# Patient Record
Sex: Female | Born: 1990 | Race: White | Hispanic: No | Marital: Single | Smoking: Never smoker
Health system: Southern US, Community
[De-identification: ages and names within clinical notes are randomized; demographics above are authoritative.]

## PROBLEM LIST (undated history)

## (undated) DIAGNOSIS — L409 Psoriasis, unspecified: Secondary | ICD-10-CM

## (undated) DIAGNOSIS — K5900 Constipation, unspecified: Secondary | ICD-10-CM

## (undated) DIAGNOSIS — L709 Acne, unspecified: Secondary | ICD-10-CM

## (undated) DIAGNOSIS — K219 Gastro-esophageal reflux disease without esophagitis: Secondary | ICD-10-CM

## (undated) DIAGNOSIS — F329 Major depressive disorder, single episode, unspecified: Secondary | ICD-10-CM

## (undated) DIAGNOSIS — F419 Anxiety disorder, unspecified: Secondary | ICD-10-CM

## (undated) DIAGNOSIS — K121 Other forms of stomatitis: Secondary | ICD-10-CM

## (undated) DIAGNOSIS — Z973 Presence of spectacles and contact lenses: Secondary | ICD-10-CM

## (undated) HISTORY — DX: Other forms of stomatitis: K12.1

## (undated) HISTORY — DX: Gastro-esophageal reflux disease without esophagitis: K21.9

## (undated) HISTORY — DX: Major depressive disorder, single episode, unspecified: F32.9

## (undated) HISTORY — PX: OTHER SURGICAL HISTORY: SHX169

## (undated) HISTORY — DX: Anxiety disorder, unspecified: F41.9

## (undated) HISTORY — PX: ESOPHAGOGASTRODUODENOSCOPY: SHX1529

---
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
## 2010-04-22 NOTE — Assessment & Plan Note (Signed)
Summary: R ear pain x 1 wk rm 3   Vital Signs:  Patient Profile:   20 Years Old Female CC:      R ear pain - x 1 wk Height:     65 inches Weight:      130 pounds O2 Sat:      100 % O2 treatment:    Room Air Temp:     97.7 degrees F oral Pulse rate:   91 / minute Pulse rhythm:   regular Resp:     16 per minute BP sitting:   115 / 69  (right arm) Cuff size:   regular  Vitals Entered By: Areta Haber CMA (October 19, 2009 2:19 PM)                  Current Allergies: No known allergies History of Present Illness Chief Complaint: R ear pain - x 1 wk History of Present Illness: LEFT ear pain for a week.  She pierced it about 6 months ago, it never healed up well and often had pus.  She tried to keep it clean.  Then took the earring out permanently and still had pain and pus.  Put on Keflex which helped.  Now 1 week ago, pain and swelling resumed.  She popped it and pus came out.  Now with tenderness, HA, swelling.  Current Problems: CELLULITIS, FACE, LEFT (ICD-682.0) FAMILY HISTORY DIABETES 1ST DEGREE RELATIVE (ICD-V18.0) GERD (ICD-530.81) ANXIETY (ICD-300.00)   Current Meds PRISTIQ 50 MG XR24H-TAB (DESVENLAFAXINE SUCCINATE) 1 tab by mouth once daily DEXILANT 60 MG CPDR (DEXLANSOPRAZOLE) 1 tab by mouth once daily SEASONIQUE 0.15-0.03 &0.01 MG TABS (LEVONORGEST-ETH ESTRAD 91-DAY) 1 tab by mouth once daily KEFLEX 500 MG CAPS (CEPHALEXIN) 1 tab by mouth two times a day for 10 days ULTRACET 37.5-325 MG TABS (TRAMADOL-ACETAMINOPHEN) 1 tab by mouth Q8 hours as needed for pain  REVIEW OF SYSTEMS Constitutional Symptoms      Denies fever, chills, night sweats, weight loss, weight gain, and fatigue.  Eyes       Denies change in vision, eye pain, eye discharge, glasses, contact lenses, and eye surgery. Ear/Nose/Throat/Mouth       Complains of ear pain and ear discharge.      Denies hearing loss/aids, change in hearing, dizziness, frequent runny nose, frequent nose bleeds, sinus  problems, sore throat, hoarseness, and tooth pain or bleeding.      Comments: x 1 wk  Respiratory       Denies dry cough, productive cough, wheezing, shortness of breath, asthma, bronchitis, and emphysema/COPD.  Cardiovascular       Denies murmurs, chest pain, and tires easily with exhertion.    Gastrointestinal       Denies stomach pain, nausea/vomiting, diarrhea, constipation, blood in bowel movements, and indigestion. Genitourniary       Denies painful urination, kidney stones, and loss of urinary control. Neurological       Complains of headaches.      Denies paralysis, seizures, and fainting/blackouts. Musculoskeletal       Denies muscle pain, joint pain, joint stiffness, decreased range of motion, redness, swelling, muscle weakness, and gout.  Skin       Denies bruising, unusual mles/lumps or sores, and hair/skin or nail changes.  Psych       Denies mood changes, temper/anger issues, anxiety/stress, speech problems, depression, and sleep problems. Other Comments: Pt has not seen PCP for this.   Past History:  Past Medical History: Anxiety GERD  Family  History: Family History of Anxiety Family History Diabetes 1st degree relative Family Hsitory Headaches Family History of Cardiovascular disorder  Social History: Single Never Smoked Alcohol use-yes - once a week Drug use-no Regular exercise-no Smoking Status:  never Drug Use:  no Does Patient Exercise:  no Physical Exam General appearance: well developed, well nourished, no acute distress Ears: Left upper pinna with small swollen area <0.5cm c/w old abscess or old piecing, no discharge, mild erythema and mild tenderness Nasal: mucosa pink, nonedematous, no septal deviation, turbinates normal Oral/Pharynx: tongue normal, posterior pharynx without erythema or exudate Neck: neck supple,  trachea midline, no masses Heart: regular rate and  rhythm, no murmur Abdomen: soft, non-tender without obvious organomegaly Skin:  no obvious rashes or lesions MSE: oriented to time, place, and person Assessment New Problems: CELLULITIS, FACE, LEFT (ICD-682.0) FAMILY HISTORY DIABETES 1ST DEGREE RELATIVE (ICD-V18.0) GERD (ICD-530.81) ANXIETY (ICD-300.00)   Plan New Medications/Changes: ULTRACET 37.5-325 MG TABS (TRAMADOL-ACETAMINOPHEN) 1 tab by mouth Q8 hours as needed for pain  #15 x 0, 10/19/2009, Hoyt Koch MD KEFLEX 500 MG CAPS (CEPHALEXIN) 1 tab by mouth two times a day for 10 days  #20 x 0, 10/19/2009, Hoyt Koch MD  New Orders: New Patient Level II [phone]  The patient and/or caregiver has been counseled thoroughly with regard to medications prescribed including dosage, schedule, interactions, rationale for use, and possible side effects and they verbalize understanding.  Diagnoses and expected course of recovery discussed and will return if not improved as expected or if the condition worsens. Patient and/or caregiver verbalized understanding.  Prescriptions: ULTRACET 37.5-325 MG TABS (TRAMADOL-ACETAMINOPHEN) 1 tab by mouth Q8 hours as needed for pain  #15 x 0   Entered and Authorized by:   Hoyt Koch MD   Signed by:   Hoyt Koch MD on 10/19/2009   Method used:   Print then Give to Patient   RxID:   [id number] KEFLEX 500 MG CAPS (CEPHALEXIN) 1 tab by mouth two times a day for 10 days  #20 x 0   Entered and Authorized by:   Hoyt Koch MD   Signed by:   Hoyt Koch MD on 10/19/2009   Method used:   Print then Give to Patient   RxID:   [id number]   Patient Instructions: 1)  Keep area clean and dry 2)  Take the antibiotics as prescribed 3)  If this continues to recur, may need to see either plastic surgery or dermatology to have this removed. 4)  Follow-up with your primary care physician   Orders Added: 1)  New Patient Level II [74259]

## 2011-08-07 DIAGNOSIS — J309 Allergic rhinitis, unspecified: Secondary | ICD-10-CM | POA: Insufficient documentation

## 2013-05-02 ENCOUNTER — Encounter: Payer: Self-pay | Admitting: Family Medicine

## 2013-05-02 ENCOUNTER — Ambulatory Visit (INDEPENDENT_AMBULATORY_CARE_PROVIDER_SITE_OTHER): Payer: BC Managed Care – PPO | Admitting: Family Medicine

## 2013-05-02 VITALS — BP 124/72 | HR 75 | Ht 66.0 in | Wt 130.0 lb

## 2013-05-02 DIAGNOSIS — F329 Major depressive disorder, single episode, unspecified: Secondary | ICD-10-CM | POA: Insufficient documentation

## 2013-05-02 DIAGNOSIS — K219 Gastro-esophageal reflux disease without esophagitis: Secondary | ICD-10-CM

## 2013-05-02 DIAGNOSIS — F419 Anxiety disorder, unspecified: Secondary | ICD-10-CM

## 2013-05-02 DIAGNOSIS — F32A Depression, unspecified: Secondary | ICD-10-CM

## 2013-05-02 DIAGNOSIS — F341 Dysthymic disorder: Secondary | ICD-10-CM

## 2013-05-02 DIAGNOSIS — R41840 Attention and concentration deficit: Secondary | ICD-10-CM | POA: Insufficient documentation

## 2013-05-02 DIAGNOSIS — F411 Generalized anxiety disorder: Secondary | ICD-10-CM

## 2013-05-02 HISTORY — DX: Depression, unspecified: F32.A

## 2013-05-02 HISTORY — DX: Anxiety disorder, unspecified: F41.9

## 2013-05-02 MED ORDER — BUPROPION HCL ER (XL) 150 MG PO TB24: 150.0000 mg | ORAL_TABLET | ORAL | Status: DC

## 2013-05-02 MED ORDER — BUPROPION HCL ER (XL) 300 MG PO TB24: 300.0000 mg | ORAL_TABLET | Freq: Every day | ORAL | Status: DC

## 2013-05-02 MED ORDER — DEXLANSOPRAZOLE 60 MG PO CPDR: 60.0000 mg | DELAYED_RELEASE_CAPSULE | Freq: Every day | ORAL | Status: DC

## 2013-05-02 MED ORDER — PANTOPRAZOLE SODIUM 40 MG PO TBEC: 40.0000 mg | DELAYED_RELEASE_TABLET | Freq: Two times a day (BID) | ORAL | Status: DC

## 2013-05-02 NOTE — Progress Notes (Signed)
CC: Andrea Baker is a 23 y.o. female is here for Establish Care and needs new rx for wellbutrin   Subjective: HPI:  Pleasant 23 year old here to establish care  Patient describes a history of anxiety and depression that has been present at least since 2011. For the past one to 2 years she has been on Wellbutrin 300 mg on a daily basis without known side effects nor uncontrolled anxiety or depression. She is extremely happy with quality of life when she is on on this medication. Unfortunately she ran out of it one week ago and decided on waiting to get a followup of women to determine if she actually needed to keep taking it. For the past 5 days she has noticed increased depression emotion, irritability, and subjective anxiety described as worrying about nothing in particular when trying to sleep and keeping her awake. Nothing seems to make the symptoms better or worse. It is present at school and at home. She denies any major life changing events that she should be depressed or anxious about recently or remotely. There has been no thoughts of wanting to harm self or others  She reports a history of concentration difficulty described as only being able to study if she is in a particular study room at her like wearing. She also has episodes of mind wandering during lectures after 30 minutes of listening, this also occurs when standing by herself.  Nothing particularly makes symptoms better or worse there mild to moderate severity present most days of the week.  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 Is described as acid and burning sensation that radiates from the epigastric region into the back of her throat substernally. It is significantly worse with chocolate, lying down flat 3 hours within having a meal, and with alcohol. It has been responsive to omeprazole and Protonix in the past however both of these medications slowly lost  their effectiveness. Currently she is taking Protonix 40 mg twice a day states that symptoms are mild to moderately daily basis.  She has a history of vomiting because this is been so bad. She has had endoscopy and even had strictures years ago. Denies regurgitation or difficulty swallowing currently  Review of Systems - General ROS: negative for - chills, fever, night sweats, weight gain or weight loss Ophthalmic ROS: negative for - decreased vision Psychological ROS: negative for - mental disturbance other than that described above ENT ROS: negative for - hearing change, nasal congestion, tinnitus or allergies Hematological and Lymphatic ROS: negative for - bleeding problems, bruising or swollen lymph nodes Breast ROS: negative Respiratory ROS: no cough, shortness of breath, or wheezing Cardiovascular ROS: no chest pain or dyspnea on exertion Gastrointestinal ROS: no  change in bowel habits, or black or bloody stools Genito-Urinary ROS: negative for - genital discharge, genital ulcers, incontinence or abnormal bleeding from genitals Musculoskeletal ROS: negative for - joint pain or muscle pain Neurological ROS: negative for - headaches or memory loss Dermatological ROS: negative for lumps, mole changes, rash and skin lesion changes  Past Medical History  Diagnosis Date  . GERD 10/19/2009    Failed omeprazole and protonix.   Andrea Baker. Anxiety and depression 05/02/2013    Intolerant to lexapro and effexor     History reviewed. No pertinent past surgical history. Family History  Problem Relation Age of Onset  . Heart attack    . Depression    . Diabetes    .  Hyperlipidemia    . Hypertension      History   Social History  . Marital Status: Single    Spouse Name: N/A    Number of Children: N/A  . Years of Education: N/A   Occupational History  . Not on file.   Social History Main Topics  . Smoking status: Never Smoker   . Smokeless tobacco: Not on file  . Alcohol Use: No  . Drug  Use: No  . Sexual Activity: Not Currently   Other Topics Concern  . Not on file   Social History Narrative  . No narrative on file     Objective: BP 124/72  Pulse 75  Ht 5\' 6"  (1.676 m)  Wt 130 lb (58.968 kg)  BMI 20.99 kg/m2  Vital signs reviewed. General: Alert and Oriented, No Acute Distress HEENT: Pupils equal, round, reactive to light. Conjunctivae clear.  External ears unremarkable.  Moist mucous membranes. Lungs: Clear and comfortable work of breathing, speaking in full sentences without accessory muscle use. Cardiac: Regular rate and rhythm.  Neuro: CN II-XII grossly intact, gait normal. Extremities: No peripheral edema.  Strong peripheral pulses.  Mental Status: No depression, anxiety, nor agitation. Logical though process. Skin: Warm and dry.  Assessment & Plan: [name] was seen today for establish care and needs new rx for wellbutrin.  Diagnoses and associated orders for this visit:  ANXIETY  Anxiety and depression - buPROPion (WELLBUTRIN XL) 150 MG 24 hr tablet; Take 1 tablet (150 mg total) by mouth every morning. - buPROPion (WELLBUTRIN XL) 300 MG 24 hr tablet; Take 1 tablet (300 mg total) by mouth daily.  GERD - dexlansoprazole (DEXILANT) 60 MG capsule; Take 1 capsule (60 mg total) by mouth daily.  Concentration deficit  Other Orders - pantoprazole (PROTONIX) 40 MG tablet; Take 1 tablet (40 mg total) by mouth 2 (two) times daily.    Anxiety and depression: Uncontrolled chronic condition restart Wellbutrin Concentration difficulty: Restart 300 mg of Wellbutrin and after 1-2 weeks of resolved anxiety and depression increased to 450 mg daily, if this does not help in the following weeks we will referred for formal ADD evaluation/testing GERD: Uncontrolled chronic condition, hold on Protonix instead try DEXILANT 60 mg daily for the next 20 days if this provides any better relief I be happy to send send in a formal prescription   Return in about 3 months  (around 07/30/2013).

## 2013-05-17 ENCOUNTER — Encounter: Payer: Self-pay | Admitting: Family Medicine

## 2013-05-17 DIAGNOSIS — L309 Dermatitis, unspecified: Secondary | ICD-10-CM | POA: Insufficient documentation

## 2013-06-06 ENCOUNTER — Telehealth: Payer: Self-pay | Admitting: Family Medicine

## 2013-06-06 ENCOUNTER — Encounter: Payer: Self-pay | Admitting: *Deleted

## 2013-06-06 DIAGNOSIS — K219 Gastro-esophageal reflux disease without esophagitis: Secondary | ICD-10-CM

## 2013-06-06 NOTE — Telephone Encounter (Signed)
Patient has been satisfied with the dexilant 60 mg sent to [facility].  thanks

## 2013-06-07 MED ORDER — DEXLANSOPRAZOLE 60 MG PO CPDR: 60.0000 mg | DELAYED_RELEASE_CAPSULE | Freq: Every day | ORAL | Status: DC

## 2013-06-07 NOTE — Telephone Encounter (Signed)
Cell number vm not set up yet; and home # no longer in service

## 2013-06-07 NOTE — Telephone Encounter (Signed)
Sue Lushndrea, Will you please let patient know rx was sent.

## 2013-07-19 ENCOUNTER — Other Ambulatory Visit: Payer: Self-pay | Admitting: *Deleted

## 2013-07-19 MED ORDER — LEVONORGEST-ETH ESTRAD 91-DAY 0.15-0.03 &0.01 MG PO TABS: 1.0000 | ORAL_TABLET | Freq: Every day | ORAL | Status: DC

## 2013-09-26 ENCOUNTER — Telehealth: Payer: Self-pay | Admitting: Family Medicine

## 2013-09-26 DIAGNOSIS — D7589 Other specified diseases of blood and blood-forming organs: Secondary | ICD-10-CM

## 2013-09-26 DIAGNOSIS — Z13220 Encounter for screening for lipoid disorders: Secondary | ICD-10-CM

## 2013-09-26 DIAGNOSIS — Z130 Encounter for screening for diseases of the blood and blood-forming organs and certain disorders involving the immune mechanism: Secondary | ICD-10-CM

## 2013-09-26 DIAGNOSIS — Z131 Encounter for screening for diabetes mellitus: Secondary | ICD-10-CM

## 2013-09-26 NOTE — Telephone Encounter (Signed)
Order placed up front and pt notified

## 2013-09-26 NOTE — Telephone Encounter (Signed)
Mom called. Daughter has a cpe scheduled for 7/21 and she would like Lab Order put in next week to include check for diabetes.  Thank you.

## 2013-09-26 NOTE — Telephone Encounter (Signed)
Sue Lushndrea, Orders placed in your inbox.

## 2013-09-30 LAB — BASIC METABOLIC PANEL WITH GFR
BUN: 9 mg/dL (ref 6–23)
CALCIUM: 9.5 mg/dL (ref 8.4–10.5)
CHLORIDE: 102 meq/L (ref 96–112)
CO2: 25 meq/L (ref 19–32)
CREATININE: 0.71 mg/dL (ref 0.50–1.10)
GFR, Est African American: >89 mL/min
GFR, Est Non African American: >89 mL/min
Glucose, Bld: 95 mg/dL (ref 70–99)
Potassium: 4.7 mEq/L (ref 3.5–5.3)
Sodium: 139 mEq/L (ref 135–145)

## 2013-09-30 LAB — LIPID PANEL
CHOLESTEROL: 153 mg/dL (ref 0–200)
HDL: 47 mg/dL (ref >39)
LDL Cholesterol: 95 mg/dL (ref 0–99)
TRIGLYCERIDES: 53 mg/dL (ref <150)
Total CHOL/HDL Ratio: 3.3 Ratio
VLDL: 11 mg/dL (ref 0–40)

## 2013-09-30 LAB — CBC
HEMATOCRIT: 41.8 % (ref 36.0–46.0)
Hemoglobin: 14.1 g/dL (ref 12.0–15.0)
MCH: 31.6 pg (ref 26.0–34.0)
MCHC: 33.7 g/dL (ref 30.0–36.0)
MCV: 93.7 fL (ref 78.0–100.0)
Platelets: 338 10*3/uL (ref 150–400)
RBC: 4.46 MIL/uL (ref 3.87–5.11)
RDW: 12.4 % (ref 11.5–15.5)
WBC: 4.8 10*3/uL (ref 4.0–10.5)

## 2013-10-05 ENCOUNTER — Encounter: Payer: BC Managed Care – PPO | Admitting: Physician Assistant

## 2013-10-10 ENCOUNTER — Encounter: Payer: BC Managed Care – PPO | Admitting: Family Medicine

## 2013-10-30 ENCOUNTER — Other Ambulatory Visit (HOSPITAL_COMMUNITY)
Admission: RE | Admit: 2013-10-30 | Discharge: 2013-10-30 | Disposition: A | Discharge status: Home / Self Care | Payer: BC Managed Care – PPO | Source: Ambulatory Visit | Attending: Physician Assistant | Admitting: Physician Assistant

## 2013-10-30 ENCOUNTER — Ambulatory Visit (INDEPENDENT_AMBULATORY_CARE_PROVIDER_SITE_OTHER): Payer: BC Managed Care – PPO | Admitting: Physician Assistant

## 2013-10-30 ENCOUNTER — Encounter: Payer: Self-pay | Admitting: Physician Assistant

## 2013-10-30 VITALS — BP 122/84 | HR 84 | Ht 65.0 in | Wt 127.0 lb

## 2013-10-30 DIAGNOSIS — N760 Acute vaginitis: Secondary | ICD-10-CM | POA: Diagnosis present

## 2013-10-30 DIAGNOSIS — Z01419 Encounter for gynecological examination (general) (routine) without abnormal findings: Secondary | ICD-10-CM | POA: Insufficient documentation

## 2013-10-30 DIAGNOSIS — L708 Other acne: Secondary | ICD-10-CM

## 2013-10-30 DIAGNOSIS — Z23 Encounter for immunization: Secondary | ICD-10-CM

## 2013-10-30 DIAGNOSIS — L700 Acne vulgaris: Secondary | ICD-10-CM

## 2013-10-30 DIAGNOSIS — Z113 Encounter for screening for infections with a predominantly sexual mode of transmission: Secondary | ICD-10-CM | POA: Insufficient documentation

## 2013-10-30 DIAGNOSIS — Z0000 Encounter for general adult medical examination without abnormal findings: Secondary | ICD-10-CM

## 2013-10-30 DIAGNOSIS — F419 Anxiety disorder, unspecified: Secondary | ICD-10-CM

## 2013-10-30 DIAGNOSIS — F329 Major depressive disorder, single episode, unspecified: Secondary | ICD-10-CM

## 2013-10-30 DIAGNOSIS — Z3041 Encounter for surveillance of contraceptive pills: Secondary | ICD-10-CM

## 2013-10-30 DIAGNOSIS — R209 Unspecified disturbances of skin sensation: Secondary | ICD-10-CM

## 2013-10-30 DIAGNOSIS — R200 Anesthesia of skin: Secondary | ICD-10-CM

## 2013-10-30 DIAGNOSIS — F341 Dysthymic disorder: Secondary | ICD-10-CM

## 2013-10-30 MED ORDER — BUPROPION HCL ER (XL) 300 MG PO TB24: 300.0000 mg | ORAL_TABLET | Freq: Every day | ORAL | Status: DC

## 2013-10-30 MED ORDER — NORGESTIM-ETH ESTRAD TRIPHASIC 0.18/0.215/0.25 MG-35 MCG PO TABS: 1.0000 | ORAL_TABLET | Freq: Every day | ORAL | Status: DC

## 2013-10-30 NOTE — Patient Instructions (Signed)
Will get EMG.   Keeping You Healthy  Get These Tests 1. Blood Pressure- Have your blood pressure checked once a year by your health care provider.  Normal blood pressure is 120/80. 2. Weight- Have your body mass index (BMI) calculated to screen for obesity.  BMI is measure of body fat based on height and weight.  You can also calculate your own BMI at https://www.west-esparza.com/www.nhlbisupport.com/bmi/. 3. Cholesterol- Have your cholesterol checked every 5 years starting at age 23 then yearly starting at age 23. 4. Chlamydia, HIV, and other sexually transmitted diseases- Get screened every year until age 23, then within three months of each new sexual provider. 5. Pap Smear- Every 1-3 years; discuss with your health care provider. 6. Mammogram- Every year starting at age 23  Take these medicines  Calcium with Vitamin D-Your body needs 1200 mg of Calcium each day and [phone] IU of Vitamin D daily.  Your body can only absorb 500 mg of Calcium at a time so Calcium must be taken in 2 or 3 divided doses throughout the day.  Multivitamin with folic acid- Once daily if it is possible for you to become pregnant.  Get these Immunizations  Tetanus shot- Every 10 years.  Flu shot-Every year.  Take these steps 1. Do not smoke-Your healthcare provider can help you quit.  For tips on how to quit go to www.smokefree.gov or call 1-800 QUITNOW. 2. Be physically active- Exercise 5 days a week for at least 30 minutes.  If you are not already physically active, start slow and gradually work up to 30 minutes of moderate physical activity.  Examples of moderate activity include walking briskly, dancing, swimming, bicycling, etc. 3. Breast Cancer- A self breast exam every month is important for early detection of breast cancer.  For more information and instruction on self breast exams, ask your healthcare provider or [place].eswww.womenshealth.gov/faq/breast-self-exam.cfm. 4. Eat a healthy diet- Eat a variety of healthy foods such as fruits,  vegetables, whole grains, low fat milk, low fat cheeses, yogurt, lean meats, poultry and fish, beans, nuts, tofu, etc.  For more information go to www. Thenutritionsource.org 5. Drink alcohol in moderation- Limit alcohol intake to one drink or less per day. Never drink and drive. 6. Depression- Your emotional health is as important as your physical health.  If you're feeling down or losing interest in things you normally enjoy please talk to your healthcare provider about being screened for depression. 7. Dental visit- Brush and floss your teeth twice daily; visit your dentist twice a year. 8. Eye doctor- Get an eye exam at least every 2 years. 9. Helmet use- Always wear a helmet when riding a bicycle, motorcycle, rollerblading or skateboarding. 10. Safe sex- If you may be exposed to sexually transmitted infections, use a condom. 11. Seat belts- Seat belts can save your live; always wear one. 12. Smoke/Carbon Monoxide detectors- These detectors need to be installed on the appropriate level of your home. Replace batteries at least once a year. 13. Skin cancer- When out in the sun please cover up and use sunscreen 15 SPF or higher. 14. Violence- If anyone is threatening or hurting you, please tell your healthcare provider.

## 2013-10-30 NOTE — Progress Notes (Addendum)
Subjective:     Andrea Baker is a 23 y.o. female and is here for a comprehensive physical exam. The patient reports problems - pt does want to change her birth control. she has read her particular brand can make acne worse. she has notice her acne has been worsening. she would like to try what her sister is on ortho tri cyclen. no other problems with birth control. pt also complains of bilateral hand numbness. mostly in her 4th and 5th metatarsal. no trauma to wrist or elbow. occurs in morning and throughout the day. something she feels like her grip is not like it should be. not aware of any other triggers. .  She has had abnormal paps. She was followed up with coloposcopy and 2 repeat paps which were normal. This was in the last 2 years.   History   Social History  . Marital Status: Single    Spouse Name: N/A    Number of Children: N/A  . Years of Education: N/A   Occupational History  . Not on file.   Social History Main Topics  . Smoking status: Never Smoker   . Smokeless tobacco: Not on file  . Alcohol Use: No  . Drug Use: No  . Sexual Activity: Not Currently   Other Topics Concern  . Not on file   Social History Narrative  . No narrative on file   Health Maintenance  Topic Date Due  . Influenza Vaccine  10/21/2013  . Pap Smear  08/22/2015  . Tetanus/tdap  10/31/2023    The following portions of the patient's history were reviewed and updated as appropriate: allergies, current medications, past family history, past medical history, past social history, past surgical history and problem list.  Review of Systems Pertinent items are noted in HPI.   Objective:    BP 122/84  Pulse 84  Ht 5\' 5"  (1.651 m)  Wt 127 lb (57.607 kg)  BMI 21.13 kg/m2 General appearance: alert, cooperative and appears stated age Head: Normocephalic, without obvious abnormality, atraumatic Eyes: conjunctivae/corneas clear. PERRL, EOM's intact. Fundi benign. Ears: normal TM's and external  ear canals both ears Nose: Nares normal. Septum midline. Mucosa normal. No drainage or sinus tenderness. Throat: lips, mucosa, and tongue normal; teeth and gums normal Neck: no adenopathy, no carotid bruit, no JVD, supple, symmetrical, trachea midline and thyroid not enlarged, symmetric, no tenderness/mass/nodules Back: symmetric, no curvature. ROM normal. No CVA tenderness. Lungs: clear to auscultation bilaterally Breasts: normal appearance, no masses or tenderness Heart: regular rate and rhythm, S1, S2 normal, no murmur, click, rub or gallop Abdomen: soft, non-tender; bowel sounds normal; no masses,  no organomegaly Pelvic: cervix normal in appearance, external genitalia normal, no adnexal masses or tenderness, no cervical motion tenderness, uterus normal size, shape, and consistency and vagina normal without discharge Extremities: extremities normal, atraumatic, no cyanosis or edema Pulses: 2+ and symmetric Skin: Skin color, texture, turgor normal. No rashes or lesions scattered whiteheads and blackheads around lips and jawline and cheeks.  Lymph nodes: Cervical, supraclavicular, and axillary nodes normal. Neurologic: Reflexes: no reflexes of knee present. cranial nerves intact and no abnormalities.  negative tinels. Phalens did produce bilateral hand numbness mostly in 4th and 5th metatarsal.    Assessment:    Healthy female exam.      Plan:    CPE- Tdap given without complications. Pap done today. Encouraged regular exercise at least 150 minutes a week. Calcium 1200mg  and Vitamin D 800units. HO given.   Bilateral hand numbness-  symptoms sound like carpel tunnel or some type of medial/ulnar nerve compression. She is very young for this. Will get EMG to evaluate. Discussed with pt to follow up with PCP. Dr. Ivan AnchorsHommel for further investigation. She already had her labs done before today. Will let PCP order labs accordingly such as TSH, b12, vitamin D, Cbc.  i did suggest wrist brace to wear at  night.    Contraception management/Acne- Switched to ortho tri cyclen for better acne management. Follow up with PCP with any other OCP changes.  Anxiety and depression- wellbutrin refilled for 6 months. Pt is very controlled.     See After Visit Summary for Counseling Recommendations

## 2013-11-02 LAB — CYTOLOGY - PAP

## 2013-12-08 ENCOUNTER — Ambulatory Visit: Payer: BC Managed Care – PPO | Admitting: Neurology

## 2013-12-28 ENCOUNTER — Ambulatory Visit: Payer: BC Managed Care – PPO | Admitting: Neurology

## 2013-12-29 ENCOUNTER — Ambulatory Visit: Payer: BC Managed Care – PPO | Admitting: Neurology

## 2013-12-29 ENCOUNTER — Encounter: Payer: Self-pay | Admitting: Neurology

## 2013-12-29 ENCOUNTER — Ambulatory Visit (INDEPENDENT_AMBULATORY_CARE_PROVIDER_SITE_OTHER): Payer: BC Managed Care – PPO | Admitting: Neurology

## 2013-12-29 VITALS — BP 90/58 | HR 78 | Ht 66.0 in | Wt 129.2 lb

## 2013-12-29 DIAGNOSIS — G5622 Lesion of ulnar nerve, left upper limb: Secondary | ICD-10-CM

## 2013-12-29 DIAGNOSIS — M79639 Pain in unspecified forearm: Secondary | ICD-10-CM

## 2013-12-29 NOTE — Progress Notes (Signed)
[facility] Clinic Note - Initial Visit   Date: 12/29/2013  Andrea Baker MRN: [id number] DOB: [birth year]   Dear Dr. Ivan Anchors:  Thank you for your kind referral of Andrea Baker for consultation of bilateral hand numbness. Although her history is well known to you, please allow [place] to reiterate it for the purpose of our medical record. The patient was accompanied to the clinic by self.    History of Present Illness: Andrea Baker is a 23 y.o. right-handed Caucasian female with history of GERD and depression presenting for evaluation of bilateral hand numbness.  Starting early 2014, she developed intermittent numbness involving the 4th and 5th digits.  She notices it more if she has had caffeine. No alleviating factors.  She has associated subjective loss of grip in both hands, making it difficult to take notes in school. She has not been dropping things. She describes it has "first getting up in the morning and not being able to squeeze hands". No associated tingling or neck pain.    She has always been very tremulous of her hands.  No family history of tremors, except her second cousin.  Tremor is worse with caffeine, stress/anxiety.  She has not noticed any change with alcohol.     Since August, she developed left great toe numbness, but this has only occurred once.    Out-side paper records, electronic medical record, and images have been reviewed where available and summarized as:  Labs 09/29/2013:  Na 139, K 4.7, Chl 102, Cr 0.7 Lab Results  Component Value Date   CHOL 153 09/29/2013   HDL 47 09/29/2013   LDLCALC 95 09/29/2013   TRIG 53 09/29/2013   CHOLHDL 3.3 09/29/2013     Past Medical History  Diagnosis Date  . GERD 10/19/2009    Failed omeprazole and protonix.   Marland Kitchen Anxiety and depression 05/02/2013    Intolerant to lexapro and effexor     No past surgical history on file.   Medications:  Current Outpatient Prescriptions on File Prior to Visit    Medication Sig Dispense Refill  . buPROPion (WELLBUTRIN XL) 300 MG 24 hr tablet Take 1 tablet (300 mg total) by mouth daily.  30 tablet  5  . esomeprazole (NEXIUM) 40 MG capsule Take 40 mg by mouth 2 (two) times daily.      . Probiotic Product (PROBIOTIC DAILY PO) Take by mouth.       No current facility-administered medications on file prior to visit.    Allergies:  Allergies  Allergen Reactions  . Augmentin [Amoxicillin-Pot Clavulanate]     vomiting    Family History: Family History  Problem Relation Age of Onset  . Heart attack    . Depression    . Diabetes    . Hyperlipidemia    . Hypertension      Social History: History   Social History  . Marital Status: Single    Spouse Name: N/A    Number of Children: N/A  . Years of Education: N/A   Occupational History  . Not on file.   Social History Main Topics  . Smoking status: Never Smoker   . Smokeless tobacco: Not on file  . Alcohol Use: No  . Drug Use: No  . Sexual Activity: Not Currently   Other Topics Concern  . Not on file   Social History Narrative  . No narrative on file    Review of Systems:  CONSTITUTIONAL: No fevers, chills, night sweats, or weight  loss.  EYES: No visual changes or eye pain ENT: No hearing changes.  No history of nose bleeds.   RESPIRATORY: No cough, wheezing and shortness of breath.   CARDIOVASCULAR: Negative for chest pain, and palpitations.   GI: Negative for abdominal discomfort, blood in stools or black stools.  No recent change in bowel habits.   GU:  No history of incontinence.   MUSCLOSKELETAL: +history of joint pain or swelling.  No myalgias.   SKIN: Negative for lesions, rash, and itching.   HEMATOLOGY/ONCOLOGY: Negative for prolonged bleeding, bruising easily, and swollen nodes.  No history of cancer.   ENDOCRINE: Negative for cold or heat intolerance, polydipsia or goiter.   PSYCH:  +depression or anxiety symptoms.   NEURO: As Above.   Vital Signs:  BP 90/58   Pulse 78  Ht 5\' 6"  (1.676 m)  Wt 129 lb 3 oz (58.599 kg)  BMI 20.86 kg/m2  SpO2 99%   General Medical Exam:   General:  Well appearing, comfortable.   Eyes/ENT: see cranial nerve examination.   Neck: No masses appreciated.  Full range of motion without tenderness.  No carotid bruits. Respiratory:  Clear to auscultation, good air entry bilaterally.   Cardiac:  Regular rate and rhythm, no murmur.   Extremities:  No deformities, edema, or skin discoloration. Good capillary refill.   Skin:  Skin color, texture, turgor normal. No rashes or lesions.  Neurological Exam: MENTAL STATUS including orientation to time, place, person, recent and remote memory, attention span and concentration, language, and fund of knowledge is normal.  Speech is not dysarthric.  CRANIAL NERVES: II:  No visual field defects.  Unremarkable fundi.   III-IV-VI: Pupils equal round and reactive to light.  Normal conjugate, extra-ocular eye movements in all directions of gaze.  No nystagmus.  No ptosis.   V:  Normal facial sensation.   VII:  Normal facial symmetry and movements.   VIII:  Normal hearing and vestibular function.   IX-X:  Normal palatal movement.   XI:  Normal shoulder shrug and head rotation.   XII:  Normal tongue strength and range of motion, no deviation or fasciculation.  MOTOR:  No atrophy.  Bilateral low amplitude high frequency tremor of the hands, worse with hands out-stretched (L >R).  No pronator drift.  Tone is normal.  Tinel's negative over bilateral wrists and elbows.  Right Upper Extremity:    Left Upper Extremity:    Deltoid  5/5   Deltoid  5/5   Biceps  5/5   Biceps  5/5   Triceps  5/5   Triceps  5/5   Wrist extensors  5/5   Wrist extensors  5/5   Wrist flexors  5/5   Wrist flexors  5/5   Finger extensors  5/5   Finger extensors  5/5   Finger flexors  5/5   Finger flexors  5/5   Dorsal interossei  5/5   Dorsal interossei  5/5   Abductor pollicis  5/5   Abductor pollicis  5/5    Tone (Ashworth scale)  0  Tone (Ashworth scale)  0   Right Lower Extremity:    Left Lower Extremity:    Hip flexors  5/5   Hip flexors  5/5   Hip extensors  5/5   Hip extensors  5/5   Knee flexors  5/5   Knee flexors  5/5   Knee extensors  5/5   Knee extensors  5/5   Dorsiflexors  5/5  Dorsiflexors  5/5   Plantarflexors  5/5   Plantarflexors  5/5   Toe extensors  5/5   Toe extensors  5/5   Toe flexors  5/5   Toe flexors  5/5   Tone (Ashworth scale)  0  Tone (Ashworth scale)  0   MSRs:  Right                                                                 Left brachioradialis 2+  brachioradialis 2+  biceps 2+  biceps 2+  triceps 2+  triceps 2+  patellar 2+  patellar 2+  ankle jerk 2+  ankle jerk 2+  Hoffman no  Hoffman no  plantar response down  plantar response down   SENSORY:  Normal and symmetric perception of light touch, pinprick, vibration, and proprioception.  Romberg's sign absent.   COORDINATION/GAIT: Normal finger-to- nose-finger and heel-to-shin.  Intact rapid alternating movements bilaterally.  Able to rise from a chair without using arms.  Gait narrow based and stable. Tandem and stressed gait intact.    IMPRESSION: 1.  Left hand paresthesias  - EMG to determine whether symptoms are related to ulnar neuropathy, less likely C8 radiculopathy  - Discussed using elbow pad and avoiding hyperflexion of the elbow to see if this helps symptoms 2.  Bilateral forearm discomfort, especially with overuse of wrists  - Will obtain electrodiagnostic testing, but the nature of these symptoms do not seem neurological, ?tendonitis 3.  Benign essential tremors, worse on left  - Follow clinically 4.  Return to clinic in 4-6 weeks   The duration of this appointment visit was 45 minutes of face-to-face time with the patient.  Greater than 50% of this time was spent in counseling, explanation of diagnosis, planning of further management, and coordination of care.   Thank you for  allowing me to participate in patient's care.  If I can answer any additional questions, I would be pleased to do so.    Sincerely,    Donika K. Allena KatzPatel, DO

## 2013-12-29 NOTE — Patient Instructions (Signed)
1.  Start using elbow bad and avoid hyperflexion of the left elbow 2.  EMG of the left > right arms 3.  Return to clinic after EMG

## 2014-01-10 ENCOUNTER — Ambulatory Visit (INDEPENDENT_AMBULATORY_CARE_PROVIDER_SITE_OTHER): Payer: BC Managed Care – PPO | Admitting: Neurology

## 2014-01-10 DIAGNOSIS — G5622 Lesion of ulnar nerve, left upper limb: Secondary | ICD-10-CM

## 2014-01-10 DIAGNOSIS — M79639 Pain in unspecified forearm: Secondary | ICD-10-CM

## 2014-01-10 NOTE — Procedures (Signed)
[facility]  [address], [address]  [place], [place] [address] Tel: [phone] Fax:  [phone] Test Date:  01/10/2014  Patient: Andrea Baker DOB: October 16, 1990 Physician: Nita Sickleonika Alonna Bartling  Sex: Female Height: 5\' 6"  Ref Phys: Nita Sickleatel, Marino Rogerson  ID#: [id number] Temp: 32.0C Technician: Ala BentSusan Reid R. NCS T.   Patient Complaints: Patient is a 23 year old female here for evaluation of paresthesias in bilateral hands, left worse than right.  NCV & EMG Findings: Extensive electrodiagnostic testing of the left upper extremity and additional studies of the right shows:  1. The left ulnar sensory response is prolonged with preserved latency. The left dorsal cutaneous ulnar sensory response is within normal limits.  The remaining sensory studies including bilateral median, radial, palmer, and the right ulnar sensory response is within normal limits. 2. Left ulnar motor nerve showed prolonged distal onset latency (3.3 ms) and decreased conduction velocity (A Elbow-B Elbow, 50 m/s). Bilateral median and the right ulnar motor responses within normal limits. 3. There is no evidence of active or chronic motor axon loss changes affecting any of the tested muscles. Motor unit recruitment pattern and configuration is within normal limits.  Impression: 1. Left ulnar neuropathy with slowing across the elbow, purely demyelinating in type. 2. There is no evidence of carpal tunnel syndrome or cervical radiculopathy affecting the upper extremities.   ___________________________ Nita Sickleonika [name]    Nerve Conduction Studies Anti Sensory Summary Table   Stim Site NR Peak (ms) Norm Peak (ms) P-T Amp (V) Norm P-T Amp  Left DorsCutan Anti Sensory (Dorsum 5th MC)  Wrist    1.4 <3.0 25.0 >18  Left Median Anti Sensory (2nd Digit)  Wrist    2.6 <3.3 53.0 >20  Right Median Anti Sensory (2nd Digit)  Wrist    3.0 <3.3 51.8 >20  Left Radial Anti Sensory (Base 1st Digit)  Wrist    2.2 <2.7 54.8 >18  Right  Radial Anti Sensory (Base 1st Digit)  Wrist    2.3 <2.7 54.1 >18  Left Ulnar Anti Sensory (5th Digit)  Wrist    3.3 <3.0 33.8 >18  Right Ulnar Anti Sensory (5th Digit)  Wrist    2.8 <3.0 43.4 >18   Motor Summary Table   Stim Site NR Onset (ms) Norm Onset (ms) O-P Amp (mV) Norm O-P Amp Site1 Site2 Delta-0 (ms) Dist (cm) Vel (m/s) Norm Vel (m/s)  Left Median Motor (Abd Poll Brev)  Wrist    3.1 <3.9 8.4 >6 Elbow Wrist 4.0 26.0 65 >51  Elbow    7.1  7.6         Right Median Motor (Abd Poll Brev)  Wrist    2.8 <3.9 6.5 >6 Elbow Wrist 4.2 26.0 62 >51  Elbow    7.0  6.2         Left Ulnar Motor (Abd Dig Minimi)  Wrist    3.3 <3.0 9.6 >8 B Elbow Wrist 3.4 22.0 65 >51  B Elbow    6.7  9.0  A Elbow B Elbow 2.0 10.0 50 >51  A Elbow    8.7  7.9         Right Ulnar Motor (Abd Dig Minimi)  Wrist    2.3 <3.0 9.2 >8 B Elbow Wrist 3.3 21.0 64 >51  B Elbow    5.6  8.6  A Elbow B Elbow 1.7 10.0 59 >51  A Elbow    7.3  8.1  Left Ulnar (FDI) Motor (1st DI)  Wrist    3.6 <3.8 11.5 >8         Comparison Summary Table   Stim Site NR Peak (ms) Norm Peak (ms) P-T Amp (V) Site1 Site2 Delta-P (ms) Norm Delta (ms)  Left Median/Ulnar Palm Comparison (Wrist - 8cm)  Median Palm    1.7 <2.2 96.1 Median Palm Ulnar Palm 0.1   Ulnar Palm    1.6 <2.2 12.2      Right Median/Ulnar Palm Comparison (Wrist - 8cm)  Median Palm    1.8 <2.2 72.4 Median Palm Ulnar Palm 0.0   Ulnar Palm    1.8 <2.2 35.7       EMG   Side Muscle Ins Act Fibs Psw Fasc Number Recrt Dur Dur. Amp Amp. Poly Poly. Comment  Right 1stDorInt Nml Nml Nml Nml Nml Nml Nml Nml Nml Nml Nml Nml N/A  Right ABD Dig Min Nml Nml Nml Nml Nml Nml Nml Nml Nml Nml Nml Nml N/A  Right Ext Indicis Nml Nml Nml Nml Nml Nml Nml Nml Nml Nml Nml Nml N/A  Right PronatorTeres Nml Nml Nml Nml Nml Nml Nml Nml Nml Nml Nml Nml N/A  Right Biceps Nml Nml Nml Nml Nml Nml Nml Nml Nml Nml Nml Nml N/A  Right Triceps Nml Nml Nml Nml Nml Nml Nml Nml Nml Nml Nml Nml N/A    Right Deltoid Nml Nml Nml Nml Nml Nml Nml Nml Nml Nml Nml Nml N/A  Left 1stDorInt Nml Nml Nml Nml Nml Nml Nml Nml Nml Nml Nml Nml N/A  Left Ext Indicis Nml Nml Nml Nml Nml Nml Nml Nml Nml Nml Nml Nml N/A  Left PronatorTeres Nml Nml Nml Nml Nml Nml Nml Nml Nml Nml Nml Nml N/A  Left Biceps Nml Nml Nml Nml Nml Nml Nml Nml Nml Nml Nml Nml N/A  Left Triceps Nml Nml Nml Nml Nml Nml Nml Nml Nml Nml Nml Nml N/A  Left Deltoid Nml Nml Nml Nml Nml Nml Nml Nml Nml Nml Nml Nml N/A  Left FlexDigProf 4,5 Nml Nml Nml Nml Nml Nml Nml Nml Nml Nml Nml Nml N/A      Waveforms:

## 2014-02-08 ENCOUNTER — Telehealth: Payer: Self-pay | Admitting: Neurology

## 2014-02-08 NOTE — Telephone Encounter (Signed)
Pt called to cancel her f/u appt for tomorrow 02/09/14. Pt does not feel that she does not need to be seen and something has come up as well. C/B [phone]

## 2014-02-09 ENCOUNTER — Ambulatory Visit: Payer: BC Managed Care – PPO | Admitting: Neurology

## 2014-03-13 ENCOUNTER — Other Ambulatory Visit: Payer: Self-pay | Admitting: Family Medicine

## 2014-03-19 ENCOUNTER — Telehealth: Payer: Self-pay | Admitting: *Deleted

## 2014-03-19 ENCOUNTER — Other Ambulatory Visit: Payer: Self-pay | Admitting: *Deleted

## 2014-03-19 MED ORDER — DROSPIRENONE-ETHINYL ESTRADIOL 3-0.02 MG PO TABS: 1.0000 | ORAL_TABLET | Freq: Every day | ORAL | Status: DC

## 2014-03-19 NOTE — Telephone Encounter (Signed)
Pt left vm stating that she's been on the tri-sprintec since Aug & has had nausea the whole time.  She was wondering if she could be switched to generic Yaz.  She states that she has several friends that take it & none of them experience any side effects.  Would you like her to come in or can we just send her in a new rx? Please advise.

## 2014-03-19 NOTE — Telephone Encounter (Signed)
Rx sent to [facility].  Pt notified.

## 2014-03-19 NOTE — Telephone Encounter (Signed)
We can send her a few months let us know how she tolerates. Ok for 2 refills.

## 2014-05-24 ENCOUNTER — Other Ambulatory Visit: Payer: Self-pay | Admitting: Physician Assistant

## 2014-05-31 ENCOUNTER — Other Ambulatory Visit: Payer: Self-pay | Admitting: Family Medicine

## 2014-06-25 ENCOUNTER — Encounter: Payer: Self-pay | Admitting: Physician Assistant

## 2014-06-25 ENCOUNTER — Ambulatory Visit (INDEPENDENT_AMBULATORY_CARE_PROVIDER_SITE_OTHER): Payer: BLUE CROSS/BLUE SHIELD | Admitting: Physician Assistant

## 2014-06-25 VITALS — BP 112/77 | HR 85 | Wt 119.0 lb

## 2014-06-25 DIAGNOSIS — Z3041 Encounter for surveillance of contraceptive pills: Secondary | ICD-10-CM

## 2014-06-25 DIAGNOSIS — F329 Major depressive disorder, single episode, unspecified: Secondary | ICD-10-CM

## 2014-06-25 DIAGNOSIS — F418 Other specified anxiety disorders: Secondary | ICD-10-CM

## 2014-06-25 DIAGNOSIS — F419 Anxiety disorder, unspecified: Secondary | ICD-10-CM

## 2014-06-25 MED ORDER — DROSPIRENONE-ETHINYL ESTRADIOL 3-0.02 MG PO TABS: 1.0000 | ORAL_TABLET | Freq: Every day | ORAL | Status: DC

## 2014-06-25 MED ORDER — BUPROPION HCL ER (XL) 300 MG PO TB24: 300.0000 mg | ORAL_TABLET | Freq: Every day | ORAL | Status: DC

## 2014-06-25 NOTE — Progress Notes (Signed)
   Subjective:    Patient ID: Andrea Baker, female    DOB: 1990-11-13, 24 y.o.   MRN: [id number]  HPI Pt is a 24 yo female who presents to the clinic for medication refills.   Patient needs her birth control refilled. She was recently seen for complete physical back in August 2015 and had normal Pap smear. We changed her birth control in December. She has no complaints or concerns with Yaz. It has helped her acne significantly. She denies any side effects of nausea.  She would also like a refill on her Wellbutrin. She currently would like to try to get off Wellbutrin. She has done really well and has absolutely no anxiety or depression at this time. She is taking a natural stress release formula that she does believe is helping some as well. She does exercise regularly. 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   Review of Systems  All other systems reviewed and are negative.      Objective:   Physical Exam  Constitutional: She is oriented to person, place, and time. She appears well-developed and well-nourished.  HENT:  Head: Normocephalic and atraumatic.  Cardiovascular: Normal rate, regular rhythm and normal heart sounds.   Pulmonary/Chest: Effort normal and breath sounds normal.  Neurological: She is alert and oriented to person, place, and time.  Skin: Skin is dry.  Psychiatric: She has a normal mood and affect. Her behavior is normal.          Assessment & Plan:  Anxiety and depression- GAD-7 was 1 and PHQ-9 was 0. She would like to try to get off wellbutrin. Discussed to cut in half for 6-8 weeks if still feeling great consider discontinuing rx. If feeling anxiety or depression creep in then restart at 150mg  daily and follow up. Refilled for 1 year.   Contraception management- refilled yaz for 1 year. Doing great. Consider complete physical in one year and she can do yearly follow-ups.

## 2014-10-31 ENCOUNTER — Ambulatory Visit (INDEPENDENT_AMBULATORY_CARE_PROVIDER_SITE_OTHER): Payer: BLUE CROSS/BLUE SHIELD | Admitting: Family Medicine

## 2014-10-31 ENCOUNTER — Encounter: Payer: Self-pay | Admitting: Family Medicine

## 2014-10-31 VITALS — BP 113/70 | HR 72 | Temp 98.3°F | Wt 120.0 lb

## 2014-10-31 DIAGNOSIS — R319 Hematuria, unspecified: Secondary | ICD-10-CM

## 2014-10-31 LAB — POCT URINALYSIS DIPSTICK
Glucose, UA: NEGATIVE
KETONES UA: 15
Nitrite, UA: POSITIVE
PH UA: 6.5
Urobilinogen, UA: 4

## 2014-10-31 MED ORDER — CEPHALEXIN 500 MG PO CAPS: 500.0000 mg | ORAL_CAPSULE | Freq: Three times a day (TID) | ORAL | Status: DC

## 2014-10-31 NOTE — Assessment & Plan Note (Signed)
Likely due to UTI. Culture pending. Treat with Keflex. Return in one month for test of resolution of hematuria

## 2014-10-31 NOTE — Patient Instructions (Signed)
Thank you for coming in today. If your belly pain worsens, or you have high fever, bad vomiting, blood in your stool or black tarry stool go to the Emergency Room.  Try over-the-counter AZO for temporary control of discomfort. Return in one month to make sure the urine has cleared.  Hematuria Hematuria is blood in your urine. It can be caused by a bladder infection, kidney infection, prostate infection, kidney stone, or cancer of your urinary tract. Infections can usually be treated with medicine, and a kidney stone usually will pass through your urine. If neither of these is the cause of your hematuria, further workup to find out the reason may be needed. It is very important that you tell your health care provider about any blood you see in your urine, even if the blood stops without treatment or happens without causing pain. Blood in your urine that happens and then stops and then happens again can be a symptom of a very serious condition. Also, pain is not a symptom in the initial stages of many urinary cancers. HOME CARE INSTRUCTIONS   Drink lots of fluid, 3-4 quarts a day. If you have been diagnosed with an infection, cranberry juice is especially recommended, in addition to large amounts of water.  Avoid caffeine, tea, and carbonated beverages because they tend to irritate the bladder.  Avoid alcohol because it may irritate the prostate.  Take all medicines as directed by your health care provider.  If you were prescribed an antibiotic medicine, finish it all even if you start to feel better.  If you have been diagnosed with a kidney stone, follow your health care provider's instructions regarding straining your urine to catch the stone.  Empty your bladder often. Avoid holding urine for long periods of time.  After a bowel movement, women should cleanse front to back. Use each tissue only once.  Empty your bladder before and after sexual intercourse if you are a female. SEEK  MEDICAL CARE IF:  You develop back pain.  You have a fever.  You have a feeling of sickness in your stomach (nausea) or vomiting.  Your symptoms are not better in 3 days. Return sooner if you are getting worse. SEEK IMMEDIATE MEDICAL CARE IF:   You develop severe vomiting and are unable to keep the medicine down.  You develop severe back or abdominal pain despite taking your medicines.  You begin passing a large amount of blood or clots in your urine.  You feel extremely weak or faint, or you pass out. MAKE SURE YOU:   Understand these instructions.  Will watch your condition.  Will get help right away if you are not doing well or get worse. Document Released: 03/09/2005 Document Revised: 07/24/2013 Document Reviewed: 11/07/2012 [facility] Patient Information 2015 [place], [place]. This information is not intended to replace advice given to you by your health care provider. Make sure you discuss any questions you have with your health care provider.

## 2014-10-31 NOTE — Progress Notes (Signed)
Andrea Baker is a 24 y.o. female who presents to [facility] [place]  today for blood in the urine associated with frequency urgency dysuria and pain. Symptoms started today. No fevers chills nausea vomiting or diarrhea. No treatment tried yet. Patient is currently menstruating.   Past Medical History  Diagnosis Date  . GERD 10/19/2009    Failed omeprazole and protonix.   Marland Kitchen Anxiety and depression 05/02/2013    Intolerant to lexapro and effexor    No past surgical history on file. Social History  Substance Use Topics  . Smoking status: Never Smoker   . Smokeless tobacco: Not on file  . Alcohol Use: Yes   ROS as above Medications: Current Outpatient Prescriptions  Medication Sig Dispense Refill  . buPROPion (WELLBUTRIN XL) 300 MG 24 hr tablet Take 1 tablet (300 mg total) by mouth daily. 90 tablet 4  . cephALEXin (KEFLEX) 500 MG capsule Take 1 capsule (500 mg total) by mouth 3 (three) times daily. 21 capsule 0  . drospirenone-ethinyl estradiol (YAZ,GIANVI,LORYNA) 3-0.02 MG tablet Take 1 tablet by mouth daily. 1 Package 11  . esomeprazole (NEXIUM) 40 MG capsule Take 40 mg by mouth 2 (two) times daily.    . ondansetron (ZOFRAN-ODT) 4 MG disintegrating tablet Take 4 mg by mouth.    . Probiotic Product (PROBIOTIC DAILY PO) Take by mouth.     No current facility-administered medications for this visit.   Allergies  Allergen Reactions  . Augmentin [Amoxicillin-Pot Clavulanate]     vomiting     Exam:  BP 113/70 mmHg  Pulse 72  Temp(Src) 98.3 F (36.8 C) (Oral)  Wt 120 lb (54.432 kg) Gen: Well NAD HEENT: EOMI,  MMM Lungs: Normal work of breathing. CTABL Heart: RRR no MRG Abd: NABS, Soft. Nondistended, Nontender no rebound or guarding. Exts: Brisk capillary refill, warm and well perfused.   Point-of-care urinalysis shows large bilirubin 15 ketones large blood greater than 300 protein positive nitrates and leukocyte esterase.  No results found for this  or any previous visit (from the past 24 hour(s)). No results found.   Please see individual assessment and plan sections.

## 2014-11-03 LAB — URINE CULTURE: Colony Count: >=100000

## 2014-11-05 NOTE — Progress Notes (Signed)
Quick Note:  Normal, no changes. ______ 

## 2014-11-14 ENCOUNTER — Ambulatory Visit: Payer: BLUE CROSS/BLUE SHIELD | Admitting: Family Medicine

## 2015-02-04 ENCOUNTER — Ambulatory Visit (INDEPENDENT_AMBULATORY_CARE_PROVIDER_SITE_OTHER): Payer: BLUE CROSS/BLUE SHIELD | Admitting: Family Medicine

## 2015-02-04 VITALS — Temp 98.0°F

## 2015-02-04 DIAGNOSIS — Z23 Encounter for immunization: Secondary | ICD-10-CM

## 2015-02-04 NOTE — Progress Notes (Signed)
   Subjective:    Patient ID: Andrea Baker, female    DOB: 02-Mar-1991, 24 y.o.   MRN: [id number]  HPIHere for PPD placement. No history of prior positives.    Review of Systems     Objective:   Physical Exam        Assessment & Plan:  Tolerated placement without complications.

## 2015-02-06 LAB — TB SKIN TEST
Induration: 0 mm
TB Skin Test: NEGATIVE

## 2015-02-18 ENCOUNTER — Emergency Department (INDEPENDENT_AMBULATORY_CARE_PROVIDER_SITE_OTHER)
Admission: EM | Admit: 2015-02-18 | Discharge: 2015-02-18 | Disposition: A | Discharge status: Home / Self Care | Payer: BLUE CROSS/BLUE SHIELD | Source: Home / Self Care | Attending: Family Medicine | Admitting: Family Medicine

## 2015-02-18 ENCOUNTER — Encounter: Payer: Self-pay | Admitting: *Deleted

## 2015-02-18 DIAGNOSIS — J040 Acute laryngitis: Secondary | ICD-10-CM | POA: Diagnosis not present

## 2015-02-18 DIAGNOSIS — J069 Acute upper respiratory infection, unspecified: Secondary | ICD-10-CM

## 2015-02-18 MED ORDER — AZITHROMYCIN 250 MG PO TABS: 250.0000 mg | ORAL_TABLET | Freq: Every day | ORAL | Status: DC

## 2015-02-18 MED ORDER — BENZONATATE 100 MG PO CAPS: 100.0000 mg | ORAL_CAPSULE | Freq: Three times a day (TID) | ORAL | Status: DC

## 2015-02-18 MED ORDER — FLUTICASONE PROPIONATE 50 MCG/ACT NA SUSP: NASAL | Status: DC

## 2015-02-18 NOTE — ED Provider Notes (Signed)
CSN: [id number]     Arrival date & time 02/18/15  1216 History   First MD Initiated Contact with Patient 02/18/15 1243     Chief Complaint  Patient presents with  . Hoarse  . Cough   (Consider location/radiation/quality/duration/timing/severity/associated sxs/prior Treatment) HPI  Pt is a 24yo female presenting to [facility] with c/o persistent mild intermittent productive cough for 1 week with associated bilateral ear pain, post-nasal drip, sore throat and hoarse voice.  Pt states her boyfriend has also been sick but will not go to the doctor.  She has been taking Advil cold and sinus which provides temporary relief. Denies fever, chills, n/v/d. Denies recent travel. Denies chest pain or shortness of breath. Denies headaches.   Past Medical History  Diagnosis Date  . GERD 10/19/2009    Failed omeprazole and protonix.   Marland Kitchen Anxiety and depression 05/02/2013    Intolerant to lexapro and effexor    History reviewed. No pertinent past surgical history. Family History  Problem Relation Age of Onset  . Heart attack    . Depression    . Diabetes    . Hyperlipidemia    . Hypertension     Social History  Substance Use Topics  . Smoking status: Never Smoker   . Smokeless tobacco: None  . Alcohol Use: Yes   OB History    No data available     Review of Systems  Constitutional: Negative for fever, chills and appetite change.  HENT: Positive for congestion, ear pain, postnasal drip, rhinorrhea, sore throat and voice change. Negative for trouble swallowing.   Respiratory: Positive for cough. Negative for shortness of breath.   Cardiovascular: Negative for chest pain and palpitations.  Gastrointestinal: Negative for nausea, vomiting, abdominal pain and diarrhea.  Musculoskeletal: Negative for myalgias, back pain and arthralgias.  Skin: Negative for rash.  All other systems reviewed and are negative.   Allergies  Augmentin  Home Medications   Prior to Admission medications   Medication  Sig Start Date End Date Taking? Authorizing Provider  drospirenone-ethinyl estradiol (YAZ,GIANVI,LORYNA) 3-0.02 MG tablet Take 1 tablet by mouth daily. 06/25/14  Yes Jade L Breeback, PA-C  esomeprazole (NEXIUM) 40 MG capsule Take 40 mg by mouth 2 (two) times daily.   Yes Historical Provider, MD  azithromycin (ZITHROMAX) 250 MG tablet Take 1 tablet (250 mg total) by mouth daily. Take first 2 tablets together, then 1 every day until finished. 02/18/15   Junius Finner, PA-C  benzonatate (TESSALON) 100 MG capsule Take 1 capsule (100 mg total) by mouth every 8 (eight) hours. 02/18/15   Junius Finner, PA-C  fluticasone (FLONASE) 50 MCG/ACT nasal spray 1-2 sprays per nostril daily for at least 2 weeks 02/18/15   Junius Finner, PA-C  Probiotic Product (PROBIOTIC DAILY PO) Take by mouth.    Historical Provider, MD   Meds Ordered and Administered this Visit  Medications - No data to display  BP 111/70 mmHg  Pulse 60  Temp(Src) 98.4 F (36.9 C) (Oral)  Resp 16  Wt 124 lb (56.246 kg)  SpO2 99%  LMP 02/01/2015 No data found.   Physical Exam  Constitutional: She appears well-developed and well-nourished. No distress.  HENT:  Head: Normocephalic and atraumatic.  Right Ear: Hearing, tympanic membrane, external ear and ear canal normal.  Left Ear: Hearing, tympanic membrane, external ear and ear canal normal.  Nose: Mucosal edema present. Right sinus exhibits no maxillary sinus tenderness and no frontal sinus tenderness. Left sinus exhibits no maxillary sinus tenderness and  no frontal sinus tenderness.  Mouth/Throat: Uvula is midline and mucous membranes are normal. Posterior oropharyngeal erythema present. No oropharyngeal exudate, posterior oropharyngeal edema or tonsillar abscesses.  Eyes: Conjunctivae are normal. No scleral icterus.  Neck: Normal range of motion. Neck supple.  Hoarse voice, no stridor  Cardiovascular: Normal rate, regular rhythm and normal heart sounds.   Pulmonary/Chest: Effort  normal and breath sounds normal. No stridor. No respiratory distress. She has no wheezes. She has no rales. She exhibits no tenderness.  Abdominal: Soft. Bowel sounds are normal. She exhibits no distension and no mass. There is no tenderness. There is no rebound and no guarding.  Musculoskeletal: Normal range of motion.  Lymphadenopathy:    She has no cervical adenopathy.  Neurological: She is alert.  Skin: Skin is warm and dry. She is not diaphoretic.  Nursing note and vitals reviewed.   ED Course  Procedures (including critical care time)  Labs Review Labs Reviewed - No data to display  Imaging Review No results found.     MDM   1. Laryngitis   2. Acute upper respiratory infection    Pt c/o persistent URI symptoms with hoarse voice for 1 week. Temporary relief with Advil cold/sinus. Vitals: WNL. Pt is afebrile, lungs: CTAB Encouraged pt to continue symptomatic treatment for 4-5 more days. Rx: Tessalon and Flonase, may continue Advil cold/sinus. May start Azithromycin if symptoms not improving, sooner if worsening including fever, shortness of breath or vomiting. F/u with PCP in 7-10 days as needed. Patient verbalized understanding and agreement with treatment plan.     Junius Finnerrin O'Malley, PA-C 02/18/15 1323

## 2015-02-18 NOTE — Discharge Instructions (Signed)
Try to continue conservative treatment with Advil cold and sinus, Tessalon cough drops, and Flonase.  If symptoms not improving in 4-5 days, or continue to worsen with fever, vomiting or shortness of breath, you may go ahead and fill the antibiotic prescription for Azithromycin.  If you do start taking the antibiotic, please be sure to complete the entire course to ensure the infection does not return.  See below for further instructions.   Cool Mist Vaporizers Vaporizers may help relieve the symptoms of a cough and cold. They add moisture to the air, which helps mucus to become thinner and less sticky. This makes it easier to breathe and cough up secretions. Cool mist vaporizers do not cause serious burns like hot mist vaporizers, which may also be called steamers or humidifiers. Vaporizers have not been proven to help with colds. You should not use a vaporizer if you are allergic to mold. HOME CARE INSTRUCTIONS  Follow the package instructions for the vaporizer.  Do not use anything other than distilled water in the vaporizer.  Do not run the vaporizer all of the time. This can cause mold or bacteria to grow in the vaporizer.  Clean the vaporizer after each time it is used.  Clean and dry the vaporizer well before storing it.  Stop using the vaporizer if worsening respiratory symptoms develop.   This information is not intended to replace advice given to you by your health care provider. Make sure you discuss any questions you have with your health care provider.   Document Released: 12/05/2003 Document Revised: 03/14/2013 Document Reviewed: 07/27/2012 Elsevier Interactive Patient Education 2016 Elsevier Inc.  Laryngitis Laryngitis is inflammation of your vocal cords. This causes hoarseness, coughing, loss of voice, sore throat, or a dry throat. Your vocal cords are two bands of muscles that are found in your throat. When you speak, these cords come together and vibrate. These vibrations  come out through your mouth as sound. When your vocal cords are inflamed, your voice sounds different. Laryngitis can be temporary (acute) or long-term (chronic). Most cases of acute laryngitis improve with time. Chronic laryngitis is laryngitis that lasts for more than three weeks. CAUSES Acute laryngitis may be caused by:  A viral infection.  Lots of talking, yelling, or singing. This is also called vocal strain.  Bacterial infections. Chronic laryngitis may be caused by:  Vocal strain.  Injury to your vocal cords.  Acid reflux (gastroesophageal reflux disease or GERD).  Allergies.  Sinus infection.  Smoking.  Alcohol abuse.  Breathing in chemicals or dust.  Growths on the vocal cords. RISK FACTORS Risk factors for laryngitis include:  Smoking.  Alcohol abuse.  Having allergies. SIGNS AND SYMPTOMS Symptoms of laryngitis may include:  Low, hoarse voice.  Loss of voice.  Dry cough.  Sore throat.  Stuffy nose. DIAGNOSIS Laryngitis may be diagnosed by:  Physical exam.  Throat culture.  Blood test.  Laryngoscopy. This procedure allows your health care provider to look at your vocal cords with a mirror or viewing tube. TREATMENT Treatment for laryngitis depends on what is causing it. Usually, treatment involves resting your voice and using medicines to soothe your throat. However, if your laryngitis is caused by a bacterial infection, you may need to take antibiotic medicine. If your laryngitis is caused by a growth, you may need to have a procedure to remove it. HOME CARE INSTRUCTIONS  Drink enough fluid to keep your urine clear or pale yellow.  Breathe in moist air. Use a humidifier  if you live in a dry climate.  Take medicines only as directed by your health care provider.  If you were prescribed an antibiotic medicine, finish it all even if you start to feel better.  Do not smoke cigarettes or electronic cigarettes. If you need help quitting, ask  your health care provider.  Talk as little as possible. Also avoid whispering, which can cause vocal strain.  Write instead of talking. Do this until your voice is back to normal. SEEK MEDICAL CARE IF:  You have a fever.  You have increasing pain.  You have difficulty swallowing. SEEK IMMEDIATE MEDICAL CARE IF:  You cough up blood.  You have trouble breathing.   This information is not intended to replace advice given to you by your health care provider. Make sure you discuss any questions you have with your health care provider.   Document Released: 03/09/2005 Document Revised: 03/30/2014 Document Reviewed: 08/22/2013 Elsevier Interactive Patient Education [employer]2016 Elsevier Inc.

## 2015-02-18 NOTE — ED Notes (Signed)
Pt c/o 1 week of productive cough, congestion ear pain, sore throat and hoarseness. Afebrile.

## 2015-02-25 ENCOUNTER — Ambulatory Visit (INDEPENDENT_AMBULATORY_CARE_PROVIDER_SITE_OTHER): Payer: BLUE CROSS/BLUE SHIELD | Admitting: Family Medicine

## 2015-02-25 ENCOUNTER — Encounter: Payer: Self-pay | Admitting: Family Medicine

## 2015-02-25 DIAGNOSIS — Z23 Encounter for immunization: Secondary | ICD-10-CM | POA: Diagnosis not present

## 2015-02-25 NOTE — Progress Notes (Signed)
PPD visit

## 2015-02-27 ENCOUNTER — Encounter: Payer: Self-pay | Admitting: Family Medicine

## 2015-02-27 ENCOUNTER — Ambulatory Visit (INDEPENDENT_AMBULATORY_CARE_PROVIDER_SITE_OTHER): Payer: BLUE CROSS/BLUE SHIELD | Admitting: Family Medicine

## 2015-02-27 DIAGNOSIS — Z7689 Persons encountering health services in other specified circumstances: Secondary | ICD-10-CM | POA: Diagnosis not present

## 2015-02-27 DIAGNOSIS — Z111 Encounter for screening for respiratory tuberculosis: Secondary | ICD-10-CM | POA: Diagnosis not present

## 2015-02-27 LAB — TB SKIN TEST
Induration: 0 mm
TB Skin Test: NEGATIVE

## 2015-02-27 NOTE — Progress Notes (Signed)
   Subjective:    Patient ID: Andrea Baker, female    DOB: February 24, 1991, 24 y.o.   MRN: [id number]  HPI  Jodelle GreenWhitley is here for a PPD reading. No induration or redness to injection site.   Review of Systems     Objective:   Physical Exam        Assessment & Plan:  PPD - Negative with 0 mm induration.

## 2015-05-06 ENCOUNTER — Other Ambulatory Visit: Payer: Self-pay | Admitting: Physician Assistant

## 2015-06-12 ENCOUNTER — Encounter: Payer: Self-pay | Admitting: Physician Assistant

## 2015-06-12 ENCOUNTER — Ambulatory Visit (INDEPENDENT_AMBULATORY_CARE_PROVIDER_SITE_OTHER): Payer: BLUE CROSS/BLUE SHIELD | Admitting: Physician Assistant

## 2015-06-12 VITALS — BP 105/66 | HR 80 | Ht 66.0 in | Wt 121.0 lb

## 2015-06-12 DIAGNOSIS — Z3041 Encounter for surveillance of contraceptive pills: Secondary | ICD-10-CM

## 2015-06-12 DIAGNOSIS — K5900 Constipation, unspecified: Secondary | ICD-10-CM | POA: Diagnosis not present

## 2015-06-12 DIAGNOSIS — K5904 Chronic idiopathic constipation: Secondary | ICD-10-CM | POA: Insufficient documentation

## 2015-06-12 DIAGNOSIS — K2100 Gastro-esophageal reflux disease with esophagitis, without bleeding: Secondary | ICD-10-CM

## 2015-06-12 DIAGNOSIS — K5909 Other constipation: Secondary | ICD-10-CM

## 2015-06-12 MED ORDER — LUBIPROSTONE 8 MCG PO CAPS: 8.0000 ug | ORAL_CAPSULE | Freq: Two times a day (BID) | ORAL | Status: DC

## 2015-06-12 MED ORDER — DROSPIRENONE-ETHINYL ESTRADIOL 3-0.02 MG PO TABS: 1.0000 | ORAL_TABLET | Freq: Every day | ORAL | Status: DC

## 2015-06-12 MED ORDER — ESOMEPRAZOLE MAGNESIUM 40 MG PO CPDR: 40.0000 mg | DELAYED_RELEASE_CAPSULE | Freq: Two times a day (BID) | ORAL | Status: DC

## 2015-06-12 NOTE — Progress Notes (Addendum)
   Subjective:    Patient ID: Andrea Baker, female    DOB: 03-Jan-1991, 25 y.o.   MRN: [id number]  HPI Pt presents to the clinic for birth control refill. She is doing great and tolerating wonderfully.  Last Pap 2015 and normal. No problems or concerns.   She continues to have chronic constipation. On average having 1 BM a week that is hard. She has battled this for years. She has a daily routine of magneisum citrate and lemon juice but even that is not working. She has tried mucinex in the past as well. She has some abdominal pain but not severe. No blood in stools.   GERD- controlled with nexium. Does not take every day but if goes more than 2 days has symptoms of GERD again.    Review of Systems  All other systems reviewed and are negative.      Objective:   Physical Exam  Constitutional: She is oriented to person, place, and time. She appears well-developed and well-nourished.  HENT:  Head: Normocephalic and atraumatic.  Cardiovascular: Normal rate, regular rhythm and normal heart sounds.   Pulmonary/Chest: Effort normal and breath sounds normal.  Abdominal: Bowel sounds are normal. She exhibits distension. She exhibits no mass. There is no tenderness. There is no rebound and no guarding.  Slightly distention over abdomen.  Genitourinary:  Bimanuel: no adnexal tenderness or masses palpated. Uterus palpated with no abnormality.  Neurological: She is alert and oriented to person, place, and time.  Skin: Skin is dry.  Psychiatric: She has a normal mood and affect. Her behavior is normal.          Assessment & Plan:  Chronic constipation- amitizia given bid. Discussed side effects. Follow up in 3 months.   OCP management- refilled for 1 year. Declined STD testing. Pap up to date. bimanuel and breast exam today. BP great.   GERD- refilled nexium. Discussed to use only as needed. Discussed diet management. Discussed long term side effects such as osteoporosis. Pt aware of  risks. Can supplement with zantac.

## 2015-06-12 NOTE — Patient Instructions (Signed)
Lubiprostone oral capsule °What is this medicine? °LUBIPROSTONE (loo bi PROS tone) is a laxative. It is used to treat chronic constipation and constipation caused by opioids (certain prescription pain medicines). It is also used to treat adult women with irritable bowel syndrome who have constipation. °This medicine may be used for other purposes; ask your health care provider or pharmacist if you have questions. °What should I tell my health care provider before I take this medicine? °They need to know if you have any of these conditions: °-cancer or tumor in abdomen, intestine, or stomach °-history of bowel obstruction or adhesions °-history of stool (fecal) impaction °-liver disease °-an unusual or allergic reaction to lubiprostone, other medicines, foods, dyes, or preservatives °-pregnant or trying to get pregnant °-breast-feeding °How should I use this medicine? °Take this medicine by mouth with a glass of water. Follow the directions on the prescription label. Do not cut, crush or chew this medicine. Take this medicine with food. Take your medicine at regular intervals. Do not take your medicine more often than directed. Do not stop taking except on your doctor's advice. °Talk to your pediatrician regarding the use of this medicine in children. Special care may be needed. °Overdosage: If you think you have taken too much of this medicine contact a poison control center or emergency room at once. °NOTE: This medicine is only for you. Do not share this medicine with others. °What if I miss a dose? °If you miss a dose, take it as soon as you can. If it is almost time for your next dose, take only that dose. Do not take double or extra doses. °What may interact with this medicine? °-medicines that treat diarrhea °-methadone °-other medicines for constipation °This list may not describe all possible interactions. Give your health care provider a list of all the medicines, herbs, non-prescription drugs, or dietary  supplements you use. Also tell them if you smoke, drink alcohol, or use illegal drugs. Some items may interact with your medicine. °What should I watch for while using this medicine? °Visit your doctor for regular check ups. Tell your doctor if your symptoms do not get better or if they get worse. °What side effects may I notice from receiving this medicine? °Side effects that you should report to your doctor or health care professional as soon as possible: °-allergic reactions like skin rash, itching or hives, swelling of the face, lips, or tongue °-new or worsening stomach pain °-severe or prolonged diarrhea °Side effects that usually do not require medical attention (report to your prescriber or health care professional if they continue or are bothersome): °-headache °-loose stools °-nausea °This list may not describe all possible side effects. Call your doctor for medical advice about side effects. You may report side effects to FDA at 1-800-FDA-1088. °Where should I keep my medicine? °Keep out of the reach of children. °Store at room temperature between 15 and 30 degrees C (59 and 86 degrees F). Throw away any unused medicine after the expiration date. °NOTE: This sheet is a summary. It may not cover all possible information. If you have questions about this medicine, talk to your doctor, pharmacist, or health care provider. °  °© 2016, Elsevier/[employer]. (2011-07-17 09:24:31) ° °

## 2015-06-25 ENCOUNTER — Other Ambulatory Visit: Payer: Self-pay | Admitting: Physician Assistant

## 2015-08-02 ENCOUNTER — Telehealth: Payer: Self-pay | Admitting: *Deleted

## 2015-08-02 NOTE — Telephone Encounter (Signed)
PA initiated through covermymeds Key: [id number] - Rx #: [id number]

## 2015-08-05 NOTE — Telephone Encounter (Signed)
Medication was denied for coverage. Denial letter placed in providers box

## 2015-10-28 ENCOUNTER — Ambulatory Visit (INDEPENDENT_AMBULATORY_CARE_PROVIDER_SITE_OTHER): Payer: BLUE CROSS/BLUE SHIELD | Admitting: Family Medicine

## 2015-10-28 ENCOUNTER — Encounter: Payer: Self-pay | Admitting: Family Medicine

## 2015-10-28 VITALS — BP 112/75 | HR 89 | Temp 98.6°F | Wt 122.0 lb

## 2015-10-28 DIAGNOSIS — N3000 Acute cystitis without hematuria: Secondary | ICD-10-CM | POA: Diagnosis not present

## 2015-10-28 DIAGNOSIS — K120 Recurrent oral aphthae: Secondary | ICD-10-CM | POA: Insufficient documentation

## 2015-10-28 LAB — POCT URINALYSIS DIPSTICK
Bilirubin, UA: NEGATIVE
Glucose, UA: NEGATIVE
Ketones, UA: NEGATIVE
NITRITE UA: NEGATIVE
PH UA: 7
Protein, UA: NEGATIVE
RBC UA: NEGATIVE
Spec Grav, UA: <=1.005
UROBILINOGEN UA: 0.2

## 2015-10-28 MED ORDER — CEPHALEXIN 500 MG PO CAPS: 500.0000 mg | ORAL_CAPSULE | Freq: Two times a day (BID) | ORAL | 0 refills | Status: DC

## 2015-10-28 MED ORDER — TRIAMCINOLONE ACETONIDE 0.1 % MT PSTE: 1.0000 "application " | PASTE | Freq: Two times a day (BID) | OROMUCOSAL | 11 refills | Status: DC

## 2015-10-28 NOTE — Patient Instructions (Signed)
Thank you for coming in today. Call or go to the emergency room if you get worse, have trouble breathing, have chest pains, or palpitations.    Urinary Tract Infection Urinary tract infections (UTIs) can develop anywhere along your urinary tract. Your urinary tract is your body's drainage system for removing wastes and extra water. Your urinary tract includes two kidneys, two ureters, a bladder, and a urethra. Your kidneys are a pair of bean-shaped organs. Each kidney is about the size of your fist. They are located below your ribs, one on each side of your spine. CAUSES Infections are caused by microbes, which are microscopic organisms, including fungi, viruses, and bacteria. These organisms are so small that they can only be seen through a microscope. Bacteria are the microbes that most commonly cause UTIs. SYMPTOMS  Symptoms of UTIs may vary by age and gender of the patient and by the location of the infection. Symptoms in young women typically include a frequent and intense urge to urinate and a painful, burning feeling in the bladder or urethra during urination. Older women and men are more likely to be tired, shaky, and weak and have muscle aches and abdominal pain. A fever may mean the infection is in your kidneys. Other symptoms of a kidney infection include pain in your back or sides below the ribs, nausea, and vomiting. DIAGNOSIS To diagnose a UTI, your caregiver will ask you about your symptoms. Your caregiver will also ask you to provide a urine sample. The urine sample will be tested for bacteria and white blood cells. White blood cells are made by your body to help fight infection. TREATMENT  Typically, UTIs can be treated with medication. Because most UTIs are caused by a bacterial infection, they usually can be treated with the use of antibiotics. The choice of antibiotic and length of treatment depend on your symptoms and the type of bacteria causing your infection. HOME CARE  INSTRUCTIONS  If you were prescribed antibiotics, take them exactly as your caregiver instructs you. Finish the medication even if you feel better after you have only taken some of the medication.  Drink enough water and fluids to keep your urine clear or pale yellow.  Avoid caffeine, tea, and carbonated beverages. They tend to irritate your bladder.  Empty your bladder often. Avoid holding urine for long periods of time.  Empty your bladder before and after sexual intercourse.  After a bowel movement, women should cleanse from front to back. Use each tissue only once. SEEK MEDICAL CARE IF:   You have back pain.  You develop a fever.  Your symptoms do not begin to resolve within 3 days. SEEK IMMEDIATE MEDICAL CARE IF:   You have severe back pain or lower abdominal pain.  You develop chills.  You have nausea or vomiting.  You have continued burning or discomfort with urination. MAKE SURE YOU:   Understand these instructions.  Will watch your condition.  Will get help right away if you are not doing well or get worse.   This information is not intended to replace advice given to you by your health care provider. Make sure you discuss any questions you have with your health care provider.   Document Released: 12/17/2004 Document Revised: 11/28/2014 Document Reviewed: 04/17/2011 Elsevier Interactive Patient Education 2016 Elsevier Inc.     Canker Sores Canker sores are small, painful sores that develop inside your mouth. They may also be called aphthous ulcers. You can get canker sores on the inside of  your lips or cheeks, on your tongue, or anywhere inside your mouth. You can have just one canker sore or several of them. Canker sores cannot be passed from one person to another (noncontagious). These sores are different than the sores that you may get on the outside of your lips (cold sores or fever blisters). Canker sores usually start as painful red bumps. Then they turn  into small white, yellow, or gray ulcers that have red borders. The ulcers may be quite painful. The pain may be worse when you eat or drink. CAUSES The cause of this condition is not known. RISK FACTORS This condition is more likely to develop in:  Women.  People in their teens or 1220s.  Women who are having their menstrual period.  People who are under a lot of emotional stress.  People who do not get enough iron or B vitamins.  People who have poor oral hygiene.  People who have an injury inside the mouth. This can happen after having dental work or from chewing something hard. SYMPTOMS Along with the canker sore, symptoms may also include:  Fever.  Fatigue.  Swollen lymph nodes in your neck. DIAGNOSIS This condition can be diagnosed based on your symptoms. Your health care provider will also examine your mouth. Your health care provider may also do tests if you get canker sores often or if they are very bad. Tests may include:  Blood tests to rule out other causes of canker sores.  Taking swabs from the sore to check for infection.  Taking a small piece of skin from the sore (biopsy) to test it for cancer. TREATMENT Most canker sores clear up without treatment in about 10 days. Home care is usually the only treatment that you will need. Over-the-counter medicines can relieve discomfort.If you have severe canker sores, your health care provider may prescribe:  Numbing ointment to relieve pain.  Vitamins.  Steroid medicines. These may be given as:  Oral pills.  Mouth rinses.  Gels.  Antibiotic mouth rinse. HOME CARE INSTRUCTIONS  Apply, take, or use medicines only as directed by your health care provider. These include vitamins.  If you were prescribed an antibiotic mouth rinse, finish all of it even if you start to feel better.  Until the sores are healed:  Do not drink coffee or citrus juices.  Do not eat spicy or salty foods.  Use a mild,  over-the-counter mouth rinse as directed by your health care provider.  Practice good oral hygiene.  Floss your teeth every day.  Brush your teeth with a soft brush twice each day. SEEK MEDICAL CARE IF:  Your symptoms do not get better after two weeks.  You also have a fever or swollen glands.  You get canker sores often.  You have a canker sore that is getting larger.  You cannot eat or drink due to your canker sores.   This information is not intended to replace advice given to you by your health care provider. Make sure you discuss any questions you have with your health care provider.   Document Released: 07/04/2010 Document Revised: 07/24/2014 Document Reviewed: 02/07/2014 Elsevier Interactive Patient Education [employer]2016 [employer] Inc.

## 2015-10-28 NOTE — Progress Notes (Signed)
Andrea Baker is a 25 y.o. female who presents to [facility] today for evaluation of:  1. Urinary frequency and dysuria.  Patient reports waking up this morning with increased urinary frequency and dysuria.  She also states her urine appears cloudy and has a foul urine.  Denies fever, hematuria, vaginal bleeding, vaginal discharge, and itching.  Patient used Azo with some relief of dysuria.  She claims her symptoms feel very similar to her first and only UTI a year ago.    2.  Painful oral ulcers.  She reports intermittent painful oral ulcers for most of her life.  She claims they usually last for a week and eventually subside on their own.  She has tried hydrogen peroxide without relief and benzocaine which produces temprorary relief.      Past Medical History:  Diagnosis Date  . Anxiety and depression 05/02/2013   Intolerant to lexapro and effexor   . GERD 10/19/2009   Failed omeprazole and protonix.    No past surgical history on file. Social History  Substance Use Topics  . Smoking status: Never Smoker  . Smokeless tobacco: Not on file  . Alcohol use Yes   family history is not on file.  ROS as above: No headache, visual changes, nausea, vomiting, diarrhea, constipation, dizziness, abdominal pain, skin rash, fevers, chills, night sweats, weight loss, swollen lymph nodes, body aches, joint swelling, muscle aches, chest pain, shortness of breath, mood changes, visual or auditory hallucinations.   Medications: Current Outpatient Prescriptions  Medication Sig Dispense Refill  . drospirenone-ethinyl estradiol (YAZ,GIANVI,LORYNA) 3-0.02 MG tablet Take 1 tablet by mouth daily. 3 Package 4  . esomeprazole (NEXIUM) 40 MG capsule Take 1 capsule (40 mg total) by mouth 2 (two) times daily. 180 capsule 4  . ondansetron (ZOFRAN) 4 MG tablet Take 1 tablet (4 mg total) by  mouth every 8 (eight) hours as needed for Nausea. 20 tablet 0  . Probiotic Product (PROBIOTIC DAILY PO) Take by mouth.    . cephALEXin (KEFLEX) 500 MG capsule Take 1 capsule (500 mg total) by mouth 2 (two) times daily. 14 capsule 0  . triamcinolone (KENALOG) 0.1 % paste Use as directed 1 application in the mouth or throat 2 (two) times daily. 5 g 11   No current facility-administered medications for this visit.    Allergies  Allergen Reactions  . Augmentin [Amoxicillin-Pot Clavulanate]     vomiting     Exam:  BP 112/75   Pulse 89   Temp 98.6 F (37 C) (Oral)   Wt 122 lb (55.3 kg)   BMI 19.69 kg/m  Gen: Well NAD, nontoxic appearing HEENT:  1 mm circular white colored ulcer with surrounding erythema on the left side of her Vehicle mucosa Lungs: Normal work of breathing. CTABL Heart: RRR no MRG Abd: NABS, Soft. Nondistended, Nontender.  No CVA tenderness Exts: Brisk capillary refill, warm and well perfused.    Urinalysis: Trace leukocytes, negative blood and nitrites.     Assessment and Plan: 25 y.o. female with:  1. Urinary frequency and dysuria likely  from acute cystitis. Patient is afebrile, appears well, and has no CVA tenderness.  Will treat with Keflex 500 mg BID for 7 days  2.  Aphthous ulcers.  Will treat with triamcinolone cream to aid with symptoms   No orders of the defined types were placed in this encounter.   Discussed warning signs or symptoms. Please see discharge instructions. Patient expresses understanding.

## 2015-10-31 LAB — URINE CULTURE

## 2016-05-01 DIAGNOSIS — D3131 Benign neoplasm of right choroid: Secondary | ICD-10-CM | POA: Diagnosis not present

## 2016-06-23 ENCOUNTER — Other Ambulatory Visit: Payer: Self-pay | Admitting: Physician Assistant

## 2016-08-04 ENCOUNTER — Telehealth: Payer: Self-pay | Admitting: Physician Assistant

## 2016-08-04 NOTE — Telephone Encounter (Signed)
Yes certainly. b

## 2016-08-04 NOTE — Telephone Encounter (Signed)
Pt called clinic today to see if PCP would take over writing her Rx for clobetasol foam. She has been getting this from her dermatologist for dermatitis but it is very hard to get in for an appointment there. She will come in for PCP to eval if PCP is OK with taking over treatment plan. Routing for review.

## 2016-08-05 NOTE — Telephone Encounter (Signed)
Left VM and advised of PCP recommendation.

## 2016-08-11 ENCOUNTER — Ambulatory Visit (INDEPENDENT_AMBULATORY_CARE_PROVIDER_SITE_OTHER): Payer: BLUE CROSS/BLUE SHIELD | Admitting: Physician Assistant

## 2016-08-11 ENCOUNTER — Encounter: Payer: Self-pay | Admitting: Physician Assistant

## 2016-08-11 VITALS — BP 109/71 | HR 61 | Ht 66.0 in | Wt 120.0 lb

## 2016-08-11 DIAGNOSIS — L219 Seborrheic dermatitis, unspecified: Secondary | ICD-10-CM | POA: Insufficient documentation

## 2016-08-11 DIAGNOSIS — Z30019 Encounter for initial prescription of contraceptives, unspecified: Secondary | ICD-10-CM | POA: Diagnosis not present

## 2016-08-11 MED ORDER — DROSPIRENONE-ETHINYL ESTRADIOL 3-0.02 MG PO TABS: 1.0000 | ORAL_TABLET | Freq: Every day | ORAL | 11 refills | Status: DC

## 2016-08-11 MED ORDER — CLOBETASOL PROPIONATE 0.05 % EX FOAM: Freq: Two times a day (BID) | CUTANEOUS | 5 refills | Status: DC

## 2016-08-11 NOTE — Progress Notes (Signed)
   Subjective:    Patient ID: Andrea Baker, female    DOB: [birth year], 26 y.o.   MRN: [id number]  HPI Pt is a 26 yo female who presents to the clinic for medication refill.   She has seborrheic dermatitis of scalp. clobetosol foam has helped a lot but she has ran out. She uses it approximately once a week.   She needs OCP refills. Last pap 2015 and normal. No new sexual partners. No problems or concerns.    Review of Systems  All other systems reviewed and are negative.      Objective:   Physical Exam  Constitutional: She is oriented to person, place, and time. She appears well-developed and well-nourished.  HENT:  Head: Normocephalic and atraumatic.  Erythematous scaly patches around scalp line  Cardiovascular: Normal rate, regular rhythm and normal heart sounds.   Pulmonary/Chest: Effort normal and breath sounds normal.  Neurological: She is alert and oriented to person, place, and time.  Psychiatric: She has a normal mood and affect. Her behavior is normal.          Assessment & Plan:  Marland Kitchen.Marland Kitchen.Andrea Baker was seen today for seborrheic dermatitis.  Diagnoses and all orders for this visit:  Seborrheic dermatitis of scalp -     clobetasol (OLUX) 0.05 % topical foam; Apply topically 2 (two) times daily.  Encounter for female birth control -     drospirenone-ethinyl estradiol (YAZ,GIANVI,LORYNA) 3-0.02 MG tablet; Take 1 tablet by mouth daily.   Discussed needs pap smear before next refill. Discussed side effects and risk of medications.

## 2016-08-11 NOTE — Patient Instructions (Addendum)
Seborrheic Dermatitis, Adult Seborrheic dermatitis is a skin disease that causes red, scaly patches. It usually occurs on the scalp, and it is often called dandruff. The patches may appear on other parts of the body. Skin patches tend to appear where there are many oil glands in the skin. Areas of the body that are commonly affected include:  Scalp.  Skin folds of the body.  Ears.  Eyebrows.  Neck.  Face.  Armpits.  The bearded area of men's faces. The condition may come and go for no known reason, and it is often long-lasting (chronic). What are the causes? The cause of this condition is not known. What increases the risk? This condition is more likely to develop in people who:  Have certain conditions, such as:  HIV (human immunodeficiency virus).  AIDS (acquired immunodeficiency syndrome).  Parkinson disease.  Mood disorders, such as depression.  Are 6540-26 years old. What are the signs or symptoms? Symptoms of this condition include:  Thick scales on the scalp.  Redness on the face or in the armpits.  Skin that is flaky. The flakes may be white or yellow.  Skin that seems oily or dry but is not helped with moisturizers.  Itching or burning in the affected areas. How is this diagnosed? This condition is diagnosed with a medical history and physical exam. A sample of your skin may be tested (skin biopsy). You may need to see a skin specialist (dermatologist). How is this treated? There is no cure for this condition, but treatment can help to manage the symptoms. You may get treatment to remove scales, lower the risk of skin infection, and reduce swelling or itching. Treatment may include:  Creams that reduce swelling and irritation (steroids).  Creams that reduce skin yeast.  Medicated shampoo, soaps, moisturizing creams, or ointments.  Medicated moisturizing creams or ointments. Follow these instructions at home:  Apply over-the-counter and prescription  medicines only as told by your health care provider.  Use any medicated shampoo, soaps, skin creams, or ointments only as told by your health care provider.  Keep all follow-up visits as told by your health care provider. This is important. Contact a health care provider if:  Your symptoms do not improve with treatment.  Your symptoms get worse.  You have new symptoms. This information is not intended to replace advice given to you by your health care provider. Make sure you discuss any questions you have with your health care provider. Document Released: 03/09/2005 Document Revised: 09/27/2015 Document Reviewed: 06/27/2015 Elsevier Interactive Patient Education  2017 Elsevier [employer]. Clobetasol Propionate Topical foam What is this medicine? CLOBETASOL (kloe BAY ta sol) is a corticosteroid. It is used on the skin to treat itching, redness, and swelling caused by some skin conditions. This medicine may be used for other purposes; ask your health care provider or pharmacist if you have questions. COMMON BRAND NAME(S): Olux, Olux-E, Olux-Olux-E Complete Pack What should I tell my health care provider before I take this medicine? They need to know if you have any of these conditions: -any type of active infection including measles, tuberculosis, herpes, or chickenpox -circulation problems or vascular disease -large areas of burned or damaged skin -rosacea -skin wasting or thinning -an unusual or allergic reaction to clobetasol, corticosteroids, other medicines, foods, dyes, or preservatives -pregnant or trying to get pregnant -breast-feeding How should I use this medicine? This medicine is for external use only. Do not take by mouth. Follow the directions on the prescription label. Wash your  hands before and after use. Invert the foam can and dispense a small amount of foam (up to a golf ball size dollop) into the cap of the can, onto a saucer or other cool surface, or directly on the  lesion. Do not dispense the foam onto the hands, as the foam will begin to melt immediately upon contact with warm skin, and will be difficult to apply to all affected areas. If applying to the scalp, move the hair away from the affected area(s) of the scalp so that the foam can be applied. Gently massage into affected scalp area(s) until the foam disappears. Repeat until entire affected area is treated. Do not bandage or wrap the skin being treated unless directed to do so by your doctor or health care professional. Do not get this medicine in your eyes. If you do, rinse out with plenty of cool tap water. Do not use your medicine more often than directed or for longer than ordered by your doctor or health care professional. To do so may increase the chance of side effects. Talk to your pediatrician regarding the use of this medicine in children. Special care may be needed. Elderly patients are more likely to have damaged skin through aging, and this may increase side effects. This medicine should only be used for brief periods and infrequently in older patients. Overdosage: If you think you have taken too much of this medicine contact a poison control center or emergency room at once. NOTE: This medicine is only for you. Do not share this medicine with others. What if I miss a dose? If you miss a dose, use it as soon as you can. If it is almost time for your next dose, use only that dose. Do not use double or extra doses without advice. What may interact with this medicine? Interactions are not expected. Do not use cosmetics or other skin care products on the treated area. This list may not describe all possible interactions. Give your health care provider a list of all the medicines, herbs, non-prescription drugs, or dietary supplements you use. Also tell them if you smoke, drink alcohol, or use illegal drugs. Some items may interact with your medicine. What should I watch for while using this  medicine? Tell your doctor or health care professional if your symptoms do not get better within 2 weeks, or if you develop skin irritation from the medicine. Tell your doctor or health care professional if you are exposed to anyone with measles or chickenpox, or if you develop sores or blisters that do not heal properly. What side effects may I notice from receiving this medicine? Side effects that you should report to your doctor or health care professional as soon as possible: -allergic reactions like skin rash, itching or hives, swelling of the face, lips, or tongue -changes in vision -lack of healing of the skin condition -painful, red, pus filled blisters on the skin or in hair follicles -thinning of the skin with easy bruising Side effects that usually do not require medical attention (report to your doctor or health care professional if they continue or are bothersome): -burning, irritation of the skin -redness or scaling of the skin This list may not describe all possible side effects. Call your doctor for medical advice about side effects. You may report side effects to FDA at 1-800-FDA-1088. Where should I keep my medicine? Keep out of the reach of children. Store at room temperature between 20 and 25 degrees C (68 and 77  degrees F). Keep away from heat and direct light. This medicine is flammable. Do not freeze. Throw away any unused medicine after the expiration date. NOTE: This sheet is a summary. It may not cover all possible information. If you have questions about this medicine, talk to your doctor, pharmacist, or health care provider.  2018 Elsevier/Gold Standard (2007-06-22 17:19:49)

## 2017-04-22 ENCOUNTER — Other Ambulatory Visit: Payer: Self-pay

## 2017-04-22 ENCOUNTER — Encounter: Payer: Self-pay | Admitting: Emergency Medicine

## 2017-04-22 ENCOUNTER — Emergency Department
Admission: EM | Admit: 2017-04-22 | Discharge: 2017-04-22 | Disposition: A | Discharge status: Home / Self Care | Payer: Federal, State, Local not specified - PPO | Source: Home / Self Care | Attending: Family Medicine | Admitting: Family Medicine

## 2017-04-22 DIAGNOSIS — J069 Acute upper respiratory infection, unspecified: Secondary | ICD-10-CM | POA: Diagnosis not present

## 2017-04-22 DIAGNOSIS — J029 Acute pharyngitis, unspecified: Secondary | ICD-10-CM

## 2017-04-22 HISTORY — DX: Gastro-esophageal reflux disease without esophagitis: K21.9

## 2017-04-22 LAB — POCT RAPID STREP A (OFFICE)
RAPID STREP A SCREEN: NEGATIVE
Rapid Strep A Screen: NEGATIVE

## 2017-04-22 MED ORDER — BENZOCAINE-MENTHOL 15-10 MG MT LOZG: 1.0000 | LOZENGE | Freq: Four times a day (QID) | OROMUCOSAL | 0 refills | Status: DC | PRN

## 2017-04-22 MED ORDER — IPRATROPIUM BROMIDE 0.06 % NA SOLN: 2.0000 | Freq: Four times a day (QID) | NASAL | 1 refills | Status: DC

## 2017-04-22 MED ORDER — AZITHROMYCIN 250 MG PO TABS: 250.0000 mg | ORAL_TABLET | Freq: Every day | ORAL | 0 refills | Status: DC

## 2017-04-22 NOTE — ED Provider Notes (Signed)
[facility]    CSN: [id number] Arrival date & time: 04/22/17  1002     History   Chief Complaint Chief Complaint  Patient presents with  . Sore Throat  . Headache  . Otalgia    HPI Andrea Baker is a 27 y.o. female.   HPI  Andrea Baker is a 27 y.o. female presenting to UC with c/o 2 days of worsening sinus congestion, post-nasal drip with green mucous, mild to moderately severe sore throat, mild HA and sinus pressure.  She has not taken any OTC medication today. 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 Denies fever, chills, n/v/d.  Minimal cough. She did get the flu vaccine this year.    Past Medical History:  Diagnosis Date  . Anxiety and depression 05/02/2013   Intolerant to lexapro and effexor   . GERD 10/19/2009   Failed omeprazole and protonix.   Marland Kitchen. GERD (gastroesophageal reflux disease)     Patient Active Problem List   Diagnosis Date Noted  . Seborrheic dermatitis of scalp 08/11/2016  . 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 Chronic constipation 06/12/2015  . Eczema 05/17/2013  . Anxiety and depression 05/02/2013  . Concentration deficit 05/02/2013  . GERD 10/19/2009    History reviewed. No pertinent surgical history.  OB History    No data available       Home Medications    Prior to Admission medications   Medication Sig Start Date End Date Taking? Authorizing Provider  azithromycin (ZITHROMAX) 250 MG tablet Take 1 tablet (250 mg total) by mouth daily. Take first 2 tablets together, then 1 every day until finished. 04/22/17   Jaydian Santana O, PA-C  Benzocaine-Menthol 15-10 MG LOZG Use as directed 1 lozenge in the mouth or throat 4 (four) times daily as needed. 04/22/17   Lurene ShadowPhelps, Areya Lemmerman O, PA-C  clobetasol (OLUX) 0.05 % topical foam Apply topically 2 (two) times daily. 08/11/16   Breeback, Lonna CobbJade L, PA-C  drospirenone-ethinyl estradiol (YAZ,GIANVI,LORYNA) 3-0.02 MG tablet Take 1 tablet by mouth daily. 08/11/16    Breeback, Jade L, PA-C  ipratropium (ATROVENT) 0.06 % nasal spray Place 2 sprays into both nostrils 4 (four) times daily. 04/22/17   Lurene ShadowPhelps, Miyeko Mahlum O, PA-C  ondansetron (ZOFRAN) 4 MG tablet Take 1 tablet (4 mg total) by mouth every 8 (eight) hours as needed for Nausea. 06/26/15   Breeback, Lesly RubensteinJade L, PA-C  Probiotic Product (PROBIOTIC DAILY PO) Take by mouth.    [provider]    Family History Family History  Problem Relation Age of Onset  . Heart attack Unknown   . Depression Unknown   . Diabetes Unknown   . Hyperlipidemia Unknown   . Hypertension Unknown     Social History Social History   Tobacco Use  . Smoking status: Never Smoker  . Smokeless tobacco: Never Used  Substance Use Topics  . Alcohol use: Yes  . Drug use: No     Allergies   Augmentin [amoxicillin-pot clavulanate]   Review of Systems Review of Systems  Constitutional: Positive for chills. Negative for fever.  HENT: Positive for congestion, ear pain (bilateral pressure), postnasal drip, rhinorrhea, sinus pressure and sore throat. Negative for sinus pain, trouble swallowing and voice change.   Respiratory: Positive for cough. Negative for shortness of breath.   Cardiovascular: Negative for chest pain and palpitations.  Gastrointestinal: Negative for abdominal pain, diarrhea, nausea and vomiting.  Musculoskeletal: Negative for arthralgias, back pain and myalgias.  Skin: Negative for rash.  Neurological: Positive for headaches. Negative for dizziness and light-headedness.     Physical Exam Triage Vital Signs ED Triage Vitals  Enc Vitals Group     BP 04/22/17 1042 103/67     Pulse Rate 04/22/17 1042 79     Resp 04/22/17 1042 16     Temp 04/22/17 1042 98.3 F (36.8 C)     Temp Source 04/22/17 1042 Oral     SpO2 04/22/17 1042 100 %     Weight 04/22/17 1043 120 lb (54.4 kg)     Height 04/22/17 1043 5\' 5"  (1.651 m)     Head Circumference --      Peak Flow --      Pain Score 04/22/17 1043 1      Pain Loc --      Pain Edu? --      Excl. in GC? --    No data found.  Updated Vital Signs BP 103/67 (BP Location: Right Arm)   Pulse 79   Temp 98.3 F (36.8 C) (Oral)   Resp 16   Ht 5\' 5"  (1.651 m)   Wt 120 lb (54.4 kg)   LMP 03/30/2017 (Exact Date)   SpO2 100%   BMI 19.97 kg/m   Visual Acuity Right Eye Distance:   Left Eye Distance:   Bilateral Distance:    Right Eye Near:   Left Eye Near:    Bilateral Near:     Physical Exam  Constitutional: She is oriented to person, place, and time. She appears well-developed and well-nourished.  Non-toxic appearance. She does not appear ill. No distress.  HENT:  Head: Normocephalic and atraumatic.  Right Ear: Tympanic membrane normal.  Left Ear: Tympanic membrane normal.  Nose: Mucosal edema present. Right sinus exhibits no maxillary sinus tenderness and no frontal sinus tenderness. Left sinus exhibits no maxillary sinus tenderness and no frontal sinus tenderness.  Mouth/Throat: Uvula is midline and mucous membranes are normal. Posterior oropharyngeal erythema present. No oropharyngeal exudate, posterior oropharyngeal edema or tonsillar abscesses.  Eyes: EOM are normal.  Neck: Normal range of motion. Neck supple.  Cardiovascular: Normal rate and regular rhythm.  Pulmonary/Chest: Effort normal and breath sounds normal. No stridor. No respiratory distress. She has no wheezes. She has no rhonchi.  Musculoskeletal: Normal range of motion.  Lymphadenopathy:    She has no cervical adenopathy.  Neurological: She is alert and oriented to person, place, and time.  Skin: Skin is warm and dry.  Psychiatric: She has a normal mood and affect. Her behavior is normal.  Nursing note and vitals reviewed.    UC Treatments / Results  Labs (all labs ordered are listed, but only abnormal results are displayed) Labs Reviewed  STREP A DNA PROBE  POCT RAPID STREP A (OFFICE)  POCT RAPID STREP A (OFFICE)    EKG  EKG Interpretation None        Radiology No results found.  Procedures Procedures (including critical care time)  Medications Ordered in UC Medications - No data to display   Initial Impression / Assessment and Plan / UC Course  I have reviewed the triage vital signs and the nursing notes.  Pertinent labs & imaging results that were available during my care of the patient were reviewed by me and considered in my medical decision making (see chart for details).     Hx and exam c/w viral illness Rapid strep: Negative  Doubt flu at this time.  Encouraged symptomatic treatment Prescription to  hold with expiration date for azithromycin. Pt to fill if persistent fever develops or not improving in 1 week.  F/u with PCP as needed.   Final Clinical Impressions(s) / UC Diagnoses   Final diagnoses:  Viral upper respiratory illness  Pharyngitis, unspecified etiology    ED Discharge Orders        Ordered    ipratropium (ATROVENT) 0.06 % nasal spray  4 times daily     04/22/17 1125    Benzocaine-Menthol 15-10 MG LOZG  4 times daily PRN     04/22/17 1125    azithromycin (ZITHROMAX) 250 MG tablet  Daily    Comments:  Void after 05/20/17   04/22/17 1125        Controlled Substance Prescriptions [place] Controlled Substance Registry consulted? Not Applicable   Rolla Plate 04/22/17 1146

## 2017-04-22 NOTE — ED Triage Notes (Signed)
Patient gives 2 day history of green mucus, headache, sore throat, sinus pain. No OTC today.

## 2017-04-22 NOTE — Discharge Instructions (Signed)
°  You may take 500mg  acetaminophen every 4-6 hours or in combination with ibuprofen 400-600mg  every 6-8 hours as needed for pain, inflammation, and fever.  Be sure to drink at least eight 8oz glasses of water to stay well hydrated and get at least 8 hours of sleep at night, preferably more while sick.   Your symptoms are likely due to a virus such as the common cold, however, if you developing worsening chest congestion with shortness of breath, worsening sinus/facial pain, persistent fever (>100.4*F) for 3 days, or symptoms not improving in 4-5 days, you may fill the antibiotic (azithromycin).  If you do fill the antibiotic,  please take antibiotics as prescribed and be sure to complete entire course even if you start to feel better to ensure infection does not come back.

## 2017-04-23 ENCOUNTER — Telehealth: Payer: Self-pay

## 2017-04-23 LAB — STREP A DNA PROBE: Group A Strep Probe: NOT DETECTED

## 2017-04-23 NOTE — Telephone Encounter (Signed)
Left VM with negative strep, and call back information if any questions or concerns.

## 2017-06-08 ENCOUNTER — Ambulatory Visit (INDEPENDENT_AMBULATORY_CARE_PROVIDER_SITE_OTHER): Payer: Federal, State, Local not specified - PPO | Admitting: Physician Assistant

## 2017-06-08 ENCOUNTER — Encounter: Payer: Self-pay | Admitting: Physician Assistant

## 2017-06-08 VITALS — BP 112/84 | HR 87 | Temp 98.0°F | Resp 16 | Wt 117.6 lb

## 2017-06-08 DIAGNOSIS — K5904 Chronic idiopathic constipation: Secondary | ICD-10-CM

## 2017-06-08 DIAGNOSIS — K640 First degree hemorrhoids: Secondary | ICD-10-CM | POA: Diagnosis not present

## 2017-06-08 DIAGNOSIS — K2100 Gastro-esophageal reflux disease with esophagitis, without bleeding: Secondary | ICD-10-CM

## 2017-06-08 MED ORDER — OMEPRAZOLE-SODIUM BICARBONATE 40-1100 MG PO CAPS: 1.0000 | ORAL_CAPSULE | Freq: Two times a day (BID) | ORAL | 11 refills | Status: DC

## 2017-06-08 MED ORDER — OMEPRAZOLE-SODIUM BICARBONATE 40-1100 MG PO CAPS: 1.0000 | ORAL_CAPSULE | Freq: Every day | ORAL | 11 refills | Status: DC

## 2017-06-08 MED ORDER — HYDROCORTISONE 2.5 % RE CREA: 1.0000 "application " | TOPICAL_CREAM | Freq: Every day | RECTAL | 5 refills | Status: DC

## 2017-06-08 NOTE — Progress Notes (Signed)
   Subjective:    Patient ID: Andrea Baker, female    DOB: 09-May-1990, 27 y.o.   MRN: [id number]  HPI Patient is a 27 year old female with history of GERD and constipation who presents today for a medication refill on Omeprazole-sodium bicarbonate and hydrocortisone rectal cream. Patient has had issues with GERD and constipation for most of her life and has been following up with GI specialist. She has not seen them since 2017 and they decided they did not want to refill her medications at this time. She has had at least 2 normal endoscopies. She has tried linzess for constipation. She did not like the urgency she felt. She is doing well with daily probiotic and a herbal tea. Most days she has a bowel movement. If she does get constipated she ususally will get a hemorrhoid and uses the rectal hydrocortisone cream with great benefit. She has tried prontonix, nexium and dexilant without great benefit. Zegerid has worked the longest and best. She does take twice a day.   .. Active Ambulatory Problems    Diagnosis Date Noted  . GERD 10/19/2009  . Anxiety and depression 05/02/2013  . Concentration deficit 05/02/2013  . Eczema 05/17/2013  . Chronic idiopathic constipation 06/12/2015  . 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 Seborrheic dermatitis of scalp 08/11/2016  . Grade I hemorrhoids 06/08/2017   Resolved Ambulatory Problems    Diagnosis Date Noted  . Hematuria 10/31/2014   Past Medical History:  Diagnosis Date  . Anxiety and depression 05/02/2013  . GERD 10/19/2009  . GERD (gastroesophageal reflux disease)     Review of Systems  Gastrointestinal: Positive for constipation.  Genitourinary:       GERD       Objective:   Physical Exam  Constitutional: She is oriented to person, place, and time. She appears well-developed and well-nourished.  HENT:  Head: Normocephalic and atraumatic.  Eyes: Conjunctivae are normal.  Neck: Normal range of motion. Neck supple.  Cardiovascular: Normal  rate, regular rhythm and normal heart sounds.  Pulmonary/Chest: Effort normal and breath sounds normal.  Abdominal: Soft. Bowel sounds are normal. She exhibits no distension and no mass. There is no tenderness. There is no rebound and no guarding.  Lymphadenopathy:    She has no cervical adenopathy.  Neurological: She is alert and oriented to person, place, and time.  Psychiatric: She has a normal mood and affect. Her behavior is normal.          Assessment & Plan:  Marland Kitchen.Marland Kitchen.Jodelle GreenWhitley was seen today for medication refill.  Diagnoses and all orders for this visit:  Gastroesophageal reflux disease with esophagitis -     Discontinue: omeprazole-sodium bicarbonate (ZEGERID) 40-1100 MG capsule; Take 1 capsule by mouth daily. -     Food Allergy Profile -     Food Specific IgG Allergy( Adult) -     omeprazole-sodium bicarbonate (ZEGERID) 40-1100 MG capsule; Take 1 capsule by mouth 2 (two) times daily.  Chronic idiopathic constipation -     Food Allergy Profile -     Food Specific IgG Allergy( Adult)  Grade I hemorrhoids -     hydrocortisone (ANUSOL-HC) 2.5 % rectal cream; Place 1 application rectally daily.   Refilled medications. I did encourage her to get an endoscopy at least every 5 years to confirm no esophageal changes. We are going to get food allergy testing to see if there are any high levels of IGG response that could be contributing to worsening GERD.

## 2017-06-08 NOTE — Progress Notes (Deleted)
probiotic

## 2017-07-01 DIAGNOSIS — L819 Disorder of pigmentation, unspecified: Secondary | ICD-10-CM | POA: Diagnosis not present

## 2017-07-01 DIAGNOSIS — L709 Acne, unspecified: Secondary | ICD-10-CM | POA: Diagnosis not present

## 2017-07-05 DIAGNOSIS — L709 Acne, unspecified: Secondary | ICD-10-CM | POA: Diagnosis not present

## 2017-08-01 ENCOUNTER — Encounter: Payer: Self-pay | Admitting: Emergency Medicine

## 2017-08-01 ENCOUNTER — Other Ambulatory Visit: Payer: Self-pay

## 2017-08-01 ENCOUNTER — Emergency Department (INDEPENDENT_AMBULATORY_CARE_PROVIDER_SITE_OTHER)
Admission: EM | Admit: 2017-08-01 | Discharge: 2017-08-01 | Disposition: A | Discharge status: Home / Self Care | Payer: Federal, State, Local not specified - PPO | Source: Home / Self Care | Attending: Family Medicine | Admitting: Family Medicine

## 2017-08-01 DIAGNOSIS — J069 Acute upper respiratory infection, unspecified: Secondary | ICD-10-CM

## 2017-08-01 HISTORY — DX: Acne, unspecified: L70.9

## 2017-08-01 MED ORDER — AMOXICILLIN 875 MG PO TABS: 875.0000 mg | ORAL_TABLET | Freq: Two times a day (BID) | ORAL | 0 refills | Status: DC

## 2017-08-01 MED ORDER — PREDNISONE 20 MG PO TABS: ORAL_TABLET | ORAL | 0 refills | Status: DC

## 2017-08-01 NOTE — Discharge Instructions (Addendum)
May take Tylenol for headache.  May add Pseudoephedrine ( , one or two every 4 to 6 hours) for sinus congestion.  May use Afrin nasal spray (or generic oxymetazoline) each morning for about 5 days and then discontinue.  Also recommend using saline nasal spray several times daily and saline nasal irrigation (AYR is a common brand).  Use Flonase nasal spray each morning after using Afrin nasal spray and saline nasal irrigation.  If cough develops, take plain guaifenesin (  extended release tabs such as Mucinex) twice daily, with plenty of water. Get adequate rest.    Try warm salt water gargles for sore throat.  Stop all antihistamines for now, and other non-prescription cough/cold preparations.   Begin Amoxicillin if not improving about one week or if persistent fever develops

## 2017-08-01 NOTE — ED Triage Notes (Signed)
Patient has had progression of symptoms over past 6 days; started with sore throat which is gone now; headache and ear discomfort , congestion and body aches; no known fever. She has been taking Mucinex. Her mother had similar symptoms last week.

## 2017-08-01 NOTE — ED Provider Notes (Signed)
[facility]    CSN: [id number] Arrival date & time: 08/01/17  1212     History   Chief Complaint No chief complaint on file.   HPI Andrea Baker is a 27 y.o. female.   Patient complains of six day history of typical cold-like symptoms developing over several days, including mild sore throat, sinus congestion, headache, fatigue, and cough.  She has had sweats, and now bilateral mild earache.  Her mother had similar symptoms last week.  The history is provided by the patient.    Past Medical History:  Diagnosis Date  . Acne   . Anxiety and depression 05/02/2013   Intolerant to lexapro and effexor   . GERD 10/19/2009   Failed omeprazole and protonix.   Marland Kitchen GERD (gastroesophageal reflux disease)     Patient Active Problem List   Diagnosis Date Noted  . Grade I hemorrhoids 06/08/2017  . Seborrheic dermatitis of scalp 08/11/2016  . 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 Chronic idiopathic constipation 06/12/2015  . Eczema 05/17/2013  . Anxiety and depression 05/02/2013  . Concentration deficit 05/02/2013  . GERD 10/19/2009    History reviewed. No pertinent surgical history.  OB History   None      Home Medications    Prior to Admission medications   Medication Sig Start Date End Date Taking? Authorizing Provider  spironolactone (ALDACTONE) 100 MG tablet Take 100 mg by mouth daily.   Yes [provider]  amoxicillin (AMOXIL) 875 MG tablet Take 1 tablet (875 mg total) by mouth 2 (two) times daily. (Rx void after 08/09/17) 08/01/17   Lattie Haw, MD  clobetasol (OLUX) 0.05 % topical foam Apply topically 2 (two) times daily. 08/11/16   Breeback, Lonna Cobb, PA-C  drospirenone-ethinyl estradiol (YAZ,GIANVI,LORYNA) 3-0.02 MG tablet Take 1 tablet by mouth daily. 08/11/16   Breeback, Lonna Cobb, PA-C  hydrocortisone (ANUSOL-HC) 2.5 % rectal cream Place 1 application rectally daily. 06/08/17   Breeback, Jade L, PA-C  omeprazole-sodium bicarbonate (ZEGERID) 40-1100 MG  capsule Take 1 capsule by mouth 2 (two) times daily. 06/08/17   Breeback, Jade L, PA-C  ondansetron (ZOFRAN) 4 MG tablet Take 1 tablet (4 mg total) by mouth every 8 (eight) hours as needed for Nausea. 06/26/15   Breeback, Jade L, PA-C  predniSONE (DELTASONE) 20 MG tablet Take one tab by mouth twice daily for 3 days, then one daily for 2 days. Take with food. 08/01/17   Lattie Haw, MD  Probiotic Product (PROBIOTIC DAILY PO) Take by mouth.    [provider]    Family History Family History  Problem Relation Age of Onset  . Heart attack Unknown   . Depression Unknown   . Diabetes Unknown   . Hyperlipidemia Unknown   . Hypertension Unknown     Social History Social History   Tobacco Use  . Smoking status: Never Smoker  . Smokeless tobacco: Never Used  Substance Use Topics  . Alcohol use: Yes  . Drug use: No     Allergies   Augmentin [amoxicillin-pot clavulanate]   Review of Systems Review of Systems + sore throat No cough No pleuritic pain No wheezing + nasal congestion + post-nasal drainage No sinus pain/pressure No itchy/red eyes ? earache No hemoptysis No SOB No fever, + chills/sweats No nausea No vomiting No abdominal pain No diarrhea No urinary symptoms No skin rash + fatigue + myalgias + headache Used OTC meds without relief   Physical Exam Triage Vital Signs ED Triage Vitals  Enc Vitals Group     BP 08/01/17 1239 102/69     Pulse Rate 08/01/17 1239 98     Resp 08/01/17 1239 16     Temp 08/01/17 1239 97.6 F (36.4 C)     Temp Source 08/01/17 1239 Oral     SpO2 08/01/17 1239 97 %     Weight 08/01/17 1240 118 lb (53.5 kg)     Height 08/01/17 1240  (1.676 m)     Head Circumference --      Peak Flow --      Pain Score 08/01/17 1239 6     Pain Loc --      Pain Edu? --      Excl. in GC? --    No data found.  Updated Vital Signs BP 102/69 (BP Location: Right Arm)   Pulse 98   Temp 97.6 F (36.4 C) (Oral)   Resp 16   Ht   (1.676 m)   Wt 118 lb (53.5 kg)   LMP 07/21/2017 (Exact Date)   SpO2 97%   BMI 19.05 kg/m   Visual Acuity Right Eye Distance:   Left Eye Distance:   Bilateral Distance:    Right Eye Near:   Left Eye Near:    Bilateral Near:     Physical Exam Nursing notes and Vital Signs reviewed. Appearance:  Patient appears stated age, and in no acute distress Eyes:  Pupils are equal, round, and reactive to light and accomodation.  Extraocular movement is intact.  Conjunctivae are not inflamed  Ears:  Canals normal.  Tympanic membranes normal.  Nose:  Mildly congested turbinates.  No sinus tenderness.   Pharynx:  Normal Neck:  Supple.  Enlarged posterior/lateral nodes are palpated bilaterally, tender to palpation on the left.   Lungs:  Clear to auscultation.  Breath sounds are equal.  Moving air well. Heart:  Regular rate and rhythm without murmurs, rubs, or gallops.  Abdomen:  Nontender without masses or hepatosplenomegaly.  Bowel sounds are present.  No CVA or flank tenderness.  Extremities:  No edema.  Skin:  No rash present.    UC Treatments / Results  Labs (all labs ordered are listed, but only abnormal results are displayed) Labs Reviewed - No data to display  EKG None  Radiology No results found.  Procedures Procedures (including critical care time)  Medications Ordered in UC Medications - No data to display  Initial Impression / Assessment and Plan / UC Course  I have reviewed the triage vital signs and the nursing notes.  Pertinent labs & imaging results that were available during my care of the patient were reviewed by me and considered in my medical decision making (see chart for details).    There is no evidence of bacterial infection today.   Begin prednisone burst/taper.    Final Clinical Impressions(s) / UC Diagnoses   Final diagnoses:  Viral URI     Discharge Instructions     May take Tylenol for headache.  May add Pseudoephedrine ( , one or  two every 4 to 6 hours) for sinus congestion.  May use Afrin nasal spray (or generic oxymetazoline) each morning for about 5 days and then discontinue.  Also recommend using saline nasal spray several times daily and saline nasal irrigation (AYR is a common brand).  Use Flonase nasal spray each morning after using Afrin nasal spray and saline nasal irrigation.  If cough develops, take plain guaifenesin (  extended release tabs such as Mucinex)  twice daily, with plenty of water. Get adequate rest.    Try warm salt water gargles for sore throat.  Stop all antihistamines for now, and other non-prescription cough/cold preparations.   Begin Amoxicillin if not improving about one week or if persistent fever develops. (Given a prescription to hold, with an expiration date)       ED Prescriptions    Medication Sig Dispense Auth. Provider   predniSONE (DELTASONE) 20 MG tablet Take one tab by mouth twice daily for 3 days, then one daily for 2 days. Take with food. 8 tablet Lattie Haw, MD   amoxicillin (AMOXIL) 875 MG tablet Take 1 tablet (875 mg total) by mouth 2 (two) times daily. (Rx void after 08/09/17) 20 tablet Lattie Haw, MD        Lattie Haw, MD 08/08/17 [phone]

## 2017-08-20 ENCOUNTER — Other Ambulatory Visit: Payer: Self-pay

## 2017-08-20 DIAGNOSIS — Z30019 Encounter for initial prescription of contraceptives, unspecified: Secondary | ICD-10-CM

## 2017-08-20 MED ORDER — DROSPIRENONE-ETHINYL ESTRADIOL 3-0.02 MG PO TABS: 1.0000 | ORAL_TABLET | Freq: Every day | ORAL | 11 refills | Status: DC

## 2017-08-30 DIAGNOSIS — Z681 Body mass index (BMI) 19 or less, adult: Secondary | ICD-10-CM | POA: Diagnosis not present

## 2017-08-30 DIAGNOSIS — Z01419 Encounter for gynecological examination (general) (routine) without abnormal findings: Secondary | ICD-10-CM | POA: Diagnosis not present

## 2017-10-05 ENCOUNTER — Other Ambulatory Visit: Payer: Self-pay

## 2017-10-05 ENCOUNTER — Emergency Department
Admission: EM | Admit: 2017-10-05 | Discharge: 2017-10-05 | Disposition: A | Discharge status: Home / Self Care | Payer: Federal, State, Local not specified - PPO | Source: Home / Self Care | Attending: Family Medicine | Admitting: Family Medicine

## 2017-10-05 ENCOUNTER — Encounter: Payer: Self-pay | Admitting: *Deleted

## 2017-10-05 DIAGNOSIS — R300 Dysuria: Secondary | ICD-10-CM | POA: Diagnosis not present

## 2017-10-05 DIAGNOSIS — N3090 Cystitis, unspecified without hematuria: Secondary | ICD-10-CM

## 2017-10-05 LAB — POCT URINALYSIS DIP (MANUAL ENTRY)
Bilirubin, UA: NEGATIVE
Glucose, UA: NEGATIVE mg/dL
Nitrite, UA: POSITIVE — AB
PH UA: 7.5 (ref 5.0–8.0)
SPEC GRAV UA: 1.02 (ref 1.010–1.025)
UROBILINOGEN UA: 1 U/dL

## 2017-10-05 MED ORDER — CEPHALEXIN 500 MG PO CAPS: 500.0000 mg | ORAL_CAPSULE | Freq: Two times a day (BID) | ORAL | 0 refills | Status: DC

## 2017-10-05 NOTE — Discharge Instructions (Addendum)
Increase fluid intake.  If cold like symptoms develop, try the following: Take plain guaifenesin (1200mg  extended release tabs such as Mucinex) twice daily, with plenty of water, for cough and congestion.  May add Pseudoephedrine (30mg , one or two every 4 to 6 hours) for sinus congestion.  Get adequate rest.   Also recommend using saline nasal spray several times daily and saline nasal irrigation (AYR is a common brand).   Try warm salt water gargles for sore throat.  Stop all antihistamines for now, and other non-prescription cough/cold preparations. May take Ibuprofen 200mg , 4 tabs every 8 hours with food for sore throat, body aches, etc. May take Delsym Cough Suppressant at bedtime for nighttime cough.

## 2017-10-05 NOTE — ED Provider Notes (Signed)
[facility]    CSN: [id number] Arrival date & time: 10/05/17  1711     History   Chief Complaint Chief Complaint  Patient presents with  . Dysuria    HPI Andrea Baker is a 27 y.o. female.   Patient complains of one day history of dysuria, urgency, and malodorous urine.  No fevers, chills, and sweats.  She also has developed a mild sore throat during the past two days.  The history is provided by the patient.    Past Medical History:  Diagnosis Date  . Acne   . Anxiety and depression 05/02/2013   Intolerant to lexapro and effexor   . GERD 10/19/2009   Failed omeprazole and protonix.   Marland Kitchen. GERD (gastroesophageal reflux disease)     Patient Active Problem List   Diagnosis Date Noted  . Grade I hemorrhoids 06/08/2017  . Seborrheic dermatitis of scalp 08/11/2016  . 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 Chronic idiopathic constipation 06/12/2015  . Eczema 05/17/2013  . Anxiety and depression 05/02/2013  . Concentration deficit 05/02/2013  . GERD 10/19/2009    History reviewed. No pertinent surgical history.  OB History   None      Home Medications    Prior to Admission medications   Medication Sig Start Date End Date Taking? Authorizing Provider  drospirenone-ethinyl estradiol (YAZ,GIANVI,LORYNA) 3-0.02 MG tablet Take 1 tablet by mouth daily. 08/20/17  Yes Breeback, Jade L, PA-C  omeprazole-sodium bicarbonate (ZEGERID) 40-1100 MG capsule Take 1 capsule by mouth 2 (two) times daily. 06/08/17  Yes Breeback, Jade L, PA-C  ondansetron (ZOFRAN) 4 MG tablet Take 1 tablet (4 mg total) by mouth every 8 (eight) hours as needed for Nausea. 06/26/15  Yes Breeback, Jade L, PA-C  Probiotic Product (PROBIOTIC DAILY PO) Take by mouth.   Yes [provider]  spironolactone (ALDACTONE) 100 MG tablet Take 100 mg by mouth daily.   Yes [provider]  cephALEXin (KEFLEX) 500 MG capsule Take 1 capsule (500 mg total) by mouth 2 (two) times daily. 10/05/17   Lattie HawBeese,  Armonie Mettler A, MD  clobetasol (OLUX) 0.05 % topical foam Apply topically 2 (two) times daily. 08/11/16   Breeback, Lonna CobbJade L, PA-C  hydrocortisone (ANUSOL-HC) 2.5 % rectal cream Place 1 application rectally daily. 06/08/17   Jomarie LongsBreeback, Jade L, PA-C    Family History Family History  Problem Relation Age of Onset  . Heart attack Unknown   . Depression Unknown   . Diabetes Unknown   . Hyperlipidemia Unknown   . Hypertension Unknown     Social History Social History   Tobacco Use  . Smoking status: Never Smoker  . Smokeless tobacco: Never Used  Substance Use Topics  . Alcohol use: Yes  . Drug use: No     Allergies   Augmentin [amoxicillin-pot clavulanate]   Review of Systems Review of Systems + sore throat No cough No pleuritic pain No wheezing No nasal congestion No post-nasal drainage No sinus pain/pressure No itchy/red eyes No earache No hemoptysis No SOB No fever/chills No nausea No vomiting No abdominal pain No diarrhea + dysuria No skin rash + fatigue No myalgias No headache    Physical Exam Triage Vital Signs ED Triage Vitals  Enc Vitals Group     BP 10/05/17 1730 114/76     Pulse Rate 10/05/17 1730 61     Resp 10/05/17 1730 16     Temp 10/05/17 1730 98.3 F (36.8 C)     Temp Source 10/05/17 1730  Oral     SpO2 10/05/17 1730 100 %     Weight 10/05/17 1731 117 lb (53.1 kg)     Height 10/05/17 1731 5\' 5"  (1.651 m)     Head Circumference --      Peak Flow --      Pain Score 10/05/17 1731 0     Pain Loc --      Pain Edu? --      Excl. in GC? --    No data found.  Updated Vital Signs BP 114/76 (BP Location: Right Arm)   Pulse 61   Temp 98.3 F (36.8 C) (Oral)   Resp 16   Ht 5\' 5"  (1.651 m)   Wt 117 lb (53.1 kg)   LMP 10/05/2017   SpO2 100%   BMI 19.47 kg/m   Visual Acuity Right Eye Distance:   Left Eye Distance:   Bilateral Distance:    Right Eye Near:   Left Eye Near:    Bilateral Near:     Physical Exam Nursing notes and Vital  Signs reviewed. Appearance:  Patient appears stated age, and in no acute distress Eyes:  Pupils are equal, round, and reactive to light and accomodation.  Extraocular movement is intact.  Conjunctivae are not inflamed  Ears:  Canals normal.  Tympanic membranes normal.  Nose:  Mildly congested turbinates.  No sinus tenderness.  Pharynx:  Normal Neck:  Supple.  Enlarged posterior/lateral nodes are palpated bilaterally, tender to palpation on the left.   Lungs:  Clear to auscultation.  Breath sounds are equal.  Moving air well. Heart:  Regular rate and rhythm without murmurs, rubs, or gallops.  Abdomen:  Nontender without masses or hepatosplenomegaly.  Bowel sounds are present.  No CVA or flank tenderness.  Extremities:  No edema.  Skin:  No rash present.    UC Treatments / Results  Labs (all labs ordered are listed, but only abnormal results are displayed) Labs Reviewed  POCT URINALYSIS DIP (MANUAL ENTRY) - Abnormal; Notable for the following components:      Result Value   Clarity, UA cloudy (*)    Ketones, POC UA trace (5) (*)    Blood, UA trace-intact (*)    Protein Ur, POC =30 (*)    Nitrite, UA Positive (*)    Leukocytes, UA Moderate (2+) (*)    All other components within normal limits  URINE CULTURE    EKG None  Radiology No results found.  Procedures Procedures (including critical care time)  Medications Ordered in UC Medications - No data to display  Initial Impression / Assessment and Plan / UC Course  I have reviewed the triage vital signs and the nursing notes.  Pertinent labs & imaging results that were available during my care of the patient were reviewed by me and considered in my medical decision making (see chart for details).    Urine culture pending.  Begin Keflex. May also be developing early viral URI Followup with Family Doctor if not improved in one week.    Final Clinical Impressions(s) / UC Diagnoses   Final diagnoses:  Dysuria  Cystitis       Discharge Instructions     Increase fluid intake.  If cold like symptoms develop, try the following: Take plain guaifenesin (1200mg  extended release tabs such as Mucinex) twice daily, with plenty of water, for cough and congestion.  May add Pseudoephedrine (30mg , one or two every 4 to 6 hours) for sinus congestion.  Get adequate rest.  Also recommend using saline nasal spray several times daily and saline nasal irrigation (AYR is a common brand).   Try warm salt water gargles for sore throat.  Stop all antihistamines for now, and other non-prescription cough/cold preparations. May take Ibuprofen 200mg , 4 tabs every 8 hours with food for sore throat, body aches, etc. May take Delsym Cough Suppressant at bedtime for nighttime cough.       ED Prescriptions    Medication Sig Dispense Auth. Provider   cephALEXin (KEFLEX) 500 MG capsule Take 1 capsule (500 mg total) by mouth 2 (two) times daily. 14 capsule Lattie Haw, MD        Lattie Haw, MD 10/08/17 [phone]

## 2017-10-05 NOTE — ED Triage Notes (Signed)
Pt c/o dysuria, frequent urination and odor to urine x last night. Denies fever. She also reports a mild sore throat x 2 days.

## 2017-10-07 ENCOUNTER — Telehealth: Payer: Self-pay | Admitting: *Deleted

## 2017-10-07 DIAGNOSIS — L700 Acne vulgaris: Secondary | ICD-10-CM | POA: Diagnosis not present

## 2017-10-07 LAB — URINE CULTURE
MICRO NUMBER:: 90841190
SPECIMEN QUALITY:: ADEQUATE

## 2017-10-07 NOTE — Telephone Encounter (Signed)
Callback: Patient reports she is improving. UCX results given. Encouraged to complete antibiotics.

## 2017-12-13 DIAGNOSIS — J00 Acute nasopharyngitis [common cold]: Secondary | ICD-10-CM | POA: Diagnosis not present

## 2017-12-13 DIAGNOSIS — J029 Acute pharyngitis, unspecified: Secondary | ICD-10-CM | POA: Diagnosis not present

## 2017-12-14 ENCOUNTER — Emergency Department
Admission: EM | Admit: 2017-12-14 | Discharge: 2017-12-14 | Disposition: A | Discharge status: Home / Self Care | Payer: Federal, State, Local not specified - PPO | Source: Home / Self Care | Attending: Family Medicine | Admitting: Family Medicine

## 2017-12-14 ENCOUNTER — Encounter: Payer: Self-pay | Admitting: *Deleted

## 2017-12-14 DIAGNOSIS — B9789 Other viral agents as the cause of diseases classified elsewhere: Secondary | ICD-10-CM | POA: Diagnosis not present

## 2017-12-14 DIAGNOSIS — H6502 Acute serous otitis media, left ear: Secondary | ICD-10-CM

## 2017-12-14 DIAGNOSIS — J069 Acute upper respiratory infection, unspecified: Secondary | ICD-10-CM | POA: Diagnosis not present

## 2017-12-14 MED ORDER — AMOXICILLIN 875 MG PO TABS: 875.0000 mg | ORAL_TABLET | Freq: Two times a day (BID) | ORAL | 0 refills | Status: DC

## 2017-12-14 MED ORDER — PREDNISONE 20 MG PO TABS: ORAL_TABLET | ORAL | 0 refills | Status: DC

## 2017-12-14 NOTE — Discharge Instructions (Addendum)
Take plain guaifenesin (1200mg extended release tabs such as Mucinex) twice daily, with plenty of water, for cough and congestion.  May continue Pseudoephedrine (30mg, one or two every 4 to 6 hours) for sinus congestion.  Get adequate rest.   °May use Afrin nasal spray (or generic oxymetazoline) each morning for about 5 days and then discontinue.  Also recommend using saline nasal spray several times daily and saline nasal irrigation (AYR is a common brand).  Use Flonase nasal spray each morning after using Afrin nasal spray and saline nasal irrigation. °Try warm salt water gargles for sore throat.  °Stop all antihistamines for now, and other non-prescription cough/cold preparations. °May take Delsym Cough Suppressant at bedtime for nighttime cough.  °  °

## 2017-12-14 NOTE — ED Provider Notes (Signed)
[facility]    CSN: [id number] Arrival date & time: 12/14/17  1315     History   Chief Complaint Chief Complaint  Patient presents with  . Sore Throat  . Cough    HPI Andrea Baker is a 27 y.o. female.   Patient complains of three day history of typical cold-like symptoms developing over several days, including mild sore throat, sinus congestion, headache, myalgias, night sweats, fatigue, and cough.  She has developed left earache today.  The history is provided by the patient.    Past Medical History:  Diagnosis Date  . Acne   . Anxiety and depression 05/02/2013   Intolerant to lexapro and effexor   . GERD 10/19/2009   Failed omeprazole and protonix.   Marland Kitchen GERD (gastroesophageal reflux disease)     Patient Active Problem List   Diagnosis Date Noted  . Grade I hemorrhoids 06/08/2017  . Seborrheic dermatitis of scalp 08/11/2016  . 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 Chronic idiopathic constipation 06/12/2015  . Eczema 05/17/2013  . Anxiety and depression 05/02/2013  . Concentration deficit 05/02/2013  . GERD 10/19/2009    History reviewed. No pertinent surgical history.  OB History   None      Home Medications    Prior to Admission medications   Medication Sig Start Date End Date Taking? Authorizing Provider  drospirenone-ethinyl estradiol (YAZ,GIANVI,LORYNA) 3-0.02 MG tablet Take 1 tablet by mouth daily. 08/20/17  Yes Breeback, Jade L, PA-C  ondansetron (ZOFRAN) 4 MG tablet Take 1 tablet (4 mg total) by mouth every 8 (eight) hours as needed for Nausea. 06/26/15  Yes Breeback, Jade L, PA-C  spironolactone (ALDACTONE) 100 MG tablet Take 100 mg by mouth daily.   Yes [provider]  amoxicillin (AMOXIL) 875 MG tablet Take 1 tablet (875 mg total) by mouth 2 (two) times daily. 12/14/17   Lattie Haw, MD  clobetasol (OLUX) 0.05 % topical foam Apply topically 2 (two) times daily. 08/11/16   Breeback, Lonna Cobb, PA-C  hydrocortisone (ANUSOL-HC) 2.5 %  rectal cream Place 1 application rectally daily. 06/08/17   Breeback, Jade L, PA-C  omeprazole-sodium bicarbonate (ZEGERID) 40-1100 MG capsule Take 1 capsule by mouth 2 (two) times daily. 06/08/17   Breeback, Jade L, PA-C  predniSONE (DELTASONE) 20 MG tablet Take one tab by mouth twice daily for 4 days, then one daily for 3 days. Take with food. 12/14/17   Lattie Haw, MD  Probiotic Product (PROBIOTIC DAILY PO) Take by mouth.    [provider]    Family History Family History  Problem Relation Age of Onset  . Heart attack Unknown   . Depression Unknown   . Diabetes Unknown   . Hyperlipidemia Unknown   . Hypertension Unknown     Social History Social History   Tobacco Use  . Smoking status: Never Smoker  . Smokeless tobacco: Never Used  Substance Use Topics  . Alcohol use: Yes  . Drug use: No     Allergies   Augmentin [amoxicillin-pot clavulanate]   Review of Systems Review of Systems + sore throat + cough No pleuritic pain No wheezing + nasal congestion + post-nasal drainage No sinus pain/pressure No itchy/red eyes + earache No hemoptysis No SOB No fever, + chills/sweats + nausea + vomiting, resolved No abdominal pain No diarrhea No urinary symptoms No skin rash + fatigue + myalgias + headache Used OTC meds without relief   Physical Exam Triage Vital Signs ED Triage Vitals [12/14/17 1348]  Enc Vitals Group     BP 105/72     Pulse Rate (!) 105     Resp 18     Temp 98.4 F (36.9 C)     Temp Source Oral     SpO2 98 %     Weight 113 lb (51.3 kg)     Height 5\' 6"  (1.676 m)     Head Circumference      Peak Flow      Pain Score 0     Pain Loc      Pain Edu?      Excl. in GC?    No data found.  Updated Vital Signs BP 105/72 (BP Location: Right Arm)   Pulse (!) 105   Temp 98.4 F (36.9 C) (Oral)   Resp 18   Ht 5\' 6"  (1.676 m)   Wt 51.3 kg   LMP 10/25/2017   SpO2 98%   BMI 18.24 kg/m   Visual Acuity Right Eye Distance:     Left Eye Distance:   Bilateral Distance:    Right Eye Near:   Left Eye Near:    Bilateral Near:     Physical Exam Nursing notes and Vital Signs reviewed. Appearance:  Patient appears stated age, and in no acute distress Eyes:  Pupils are equal, round, and reactive to light and accomodation.  Extraocular movement is intact.  Conjunctivae are not inflamed  Ears:  Canals normal.  Right tympanic membrane normal.  Left tympanic membrane has serous effusion and is mildly erythematous. Nose:  Congested turbinates, more pronounced on the left.  No sinus tenderness.   Pharynx:  Normal Neck:  Supple.  Enlarged posterior lateral nodes are palpated bilaterally, tender to palpation on the left.   Lungs:  Clear to auscultation.  Breath sounds are equal.  Moving air well. Heart:  Regular rate and rhythm without murmurs, rubs, or gallops.  Abdomen:  Nontender without masses or hepatosplenomegaly.  Bowel sounds are present.  No CVA or flank tenderness.  Extremities:  No edema.  Skin:  No rash present.    UC Treatments / Results  Labs (all labs ordered are listed, but only abnormal results are displayed) Labs Reviewed - No data to display  EKG None  Radiology No results found.  Procedures Procedures (including critical care time)  Medications Ordered in UC Medications - No data to display  Initial Impression / Assessment and Plan / UC Course  I have reviewed the triage vital signs and the nursing notes.  Pertinent labs & imaging results that were available during my care of the patient were reviewed by me and considered in my medical decision making (see chart for details).    Begin amoxicillin and prednisone burst/taper. Followup with Family Doctor if not improved in about 10 days   Final Clinical Impressions(s) / UC Diagnoses   Final diagnoses:  Viral URI with cough  Non-recurrent acute serous otitis media of left ear     Discharge Instructions     Take plain guaifenesin  (1200mg  extended release tabs such as Mucinex) twice daily, with plenty of water, for cough and congestion.  May continue Pseudoephedrine (30mg , one or two every 4 to 6 hours) for sinus congestion.  Get adequate rest.   May use Afrin nasal spray (or generic oxymetazoline) each morning for about 5 days and then discontinue.  Also recommend using saline nasal spray several times daily and saline nasal irrigation (AYR is a common brand).  Use Flonase nasal spray  each morning after using Afrin nasal spray and saline nasal irrigation. Try warm salt water gargles for sore throat.  Stop all antihistamines for now, and other non-prescription cough/cold preparations. May take Delsym Cough Suppressant at bedtime for nighttime cough.     ED Prescriptions    Medication Sig Dispense Auth. Provider   predniSONE (DELTASONE) 20 MG tablet Take one tab by mouth twice daily for 4 days, then one daily for 3 days. Take with food. 11 tablet Lattie Haw, MD   amoxicillin (AMOXIL) 875 MG tablet Take 1 tablet (875 mg total) by mouth 2 (two) times daily. 20 tablet Lattie Haw, MD        Lattie Haw, MD 12/16/17 [phone]

## 2017-12-14 NOTE — ED Triage Notes (Signed)
Pt c/o sore throat x 2 days. She was seen at [facility] yesterday dx with URI; negative strep. Today she developed a mild cough, LT ear ache, vomiting and runny nose. She has taken Tylenol, Tylenol cold/sinus, and Sudafed. She called [facility] back to request her work note to be extended they advised her she needed to be seen.

## 2017-12-21 ENCOUNTER — Telehealth: Payer: Self-pay | Admitting: Physician Assistant

## 2017-12-21 NOTE — Telephone Encounter (Signed)
Received fax from [employer] that Zegrid requires a PA. Information has been sent to the insurance company. Awaiting determination.

## 2017-12-21 NOTE — Telephone Encounter (Signed)
Received fax from [employer] [employer] that Zegerid was approved from 12/21/2017 through 03/21/2018. Pharmacy aware spoke with [facility]. -hsm.

## 2018-01-06 DIAGNOSIS — L709 Acne, unspecified: Secondary | ICD-10-CM | POA: Diagnosis not present

## 2018-04-06 ENCOUNTER — Encounter: Payer: Self-pay | Admitting: Physician Assistant

## 2018-04-14 ENCOUNTER — Other Ambulatory Visit: Payer: Self-pay | Admitting: Physician Assistant

## 2018-04-14 DIAGNOSIS — L700 Acne vulgaris: Secondary | ICD-10-CM | POA: Diagnosis not present

## 2018-04-14 MED ORDER — OMEPRAZOLE 40 MG PO CPDR
40.0000 mg | DELAYED_RELEASE_CAPSULE | Freq: Every day | ORAL | 3 refills | Status: DC
Start: 2018-04-14 — End: 2018-11-24

## 2018-04-29 ENCOUNTER — Telehealth: Payer: Self-pay

## 2018-04-29 ENCOUNTER — Encounter: Payer: Self-pay | Admitting: Physician Assistant

## 2018-04-29 ENCOUNTER — Ambulatory Visit (INDEPENDENT_AMBULATORY_CARE_PROVIDER_SITE_OTHER): Payer: Federal, State, Local not specified - PPO | Admitting: Physician Assistant

## 2018-04-29 VITALS — BP 116/77 | HR 73 | Ht 66.0 in | Wt 115.0 lb

## 2018-04-29 DIAGNOSIS — F329 Major depressive disorder, single episode, unspecified: Secondary | ICD-10-CM

## 2018-04-29 DIAGNOSIS — R634 Abnormal weight loss: Secondary | ICD-10-CM

## 2018-04-29 DIAGNOSIS — F419 Anxiety disorder, unspecified: Secondary | ICD-10-CM

## 2018-04-29 DIAGNOSIS — F410 Panic disorder [episodic paroxysmal anxiety] without agoraphobia: Secondary | ICD-10-CM

## 2018-04-29 DIAGNOSIS — K2100 Gastro-esophageal reflux disease with esophagitis, without bleeding: Secondary | ICD-10-CM

## 2018-04-29 DIAGNOSIS — F515 Nightmare disorder: Secondary | ICD-10-CM

## 2018-04-29 MED ORDER — CLONAZEPAM 0.5 MG PO TABS: 0.2500 mg | ORAL_TABLET | Freq: Two times a day (BID) | ORAL | 0 refills | Status: DC | PRN

## 2018-04-29 MED ORDER — NIZATIDINE 150 MG PO CAPS: 150.0000 mg | ORAL_CAPSULE | Freq: Two times a day (BID) | ORAL | 5 refills | Status: DC

## 2018-04-29 MED ORDER — PAROXETINE HCL 10 MG PO TABS: 10.0000 mg | ORAL_TABLET | Freq: Every day | ORAL | 1 refills | Status: DC

## 2018-04-29 MED ORDER — FAMOTIDINE 20 MG PO TABS: 20.0000 mg | ORAL_TABLET | Freq: Two times a day (BID) | ORAL | 5 refills | Status: DC

## 2018-04-29 NOTE — Telephone Encounter (Signed)
Medication has been sent to pharmacy.  °

## 2018-04-29 NOTE — Progress Notes (Signed)
Subjective:    Patient ID: Andrea Baker, female    DOB: 08/29/1990, 28 y.o.   MRN: [id number]  HPI  Pt is a 28 yo female with GERD, anxiety and depression who presents to the clinic to follow up.   GERD- she has used zegrid for years and controlled her symptoms. zegrid is being taken off market. She has not started using just omeprazole yet. She has also not tried pepcid. She has had EGD in the past. She has to really watch her diet not to have relux. She is a Data processing managerdietitian.   She has a hx of anxiety, depression, nightmares. From 2011 to 2016 symptoms were controlled for the most part. For the last few months to maybe a year symptoms have worsened. She is finding more "sadness" than anxiety but does report some painic attacks that started about 1-2 months ago. She is having more and more nightmares that wake her up. She feels like she is "going through the motions and barely making it". She has stopped exercising. Going to work is hard. Having relationships is very hard. She is also losing weight. Last TSH was April 2019 and normal. She has been on wellbutrin before but just didn't feel like it really helped.   .. Active Ambulatory Problems    Diagnosis Date Noted  . GERD 10/19/2009  . Anxiety and depression 05/02/2013  . Concentration deficit 05/02/2013  . Eczema 05/17/2013  . Chronic idiopathic constipation 06/12/2015  . 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 Seborrheic dermatitis of scalp 08/11/2016  . Grade I hemorrhoids 06/08/2017  . Panic attacks 05/02/2018  . Unexplained weight loss 05/02/2018  . Nightmares 05/02/2018   Resolved Ambulatory Problems    Diagnosis Date Noted  . Hematuria 10/31/2014   Past Medical History:  Diagnosis Date  . Acne   . GERD (gastroesophageal reflux disease)       Review of Systems See HPI.     Objective:   Physical Exam Vitals signs reviewed.  Constitutional:      Appearance: Normal appearance.  HENT:     Head: Normocephalic and atraumatic.   Neck:     Musculoskeletal: Normal range of motion.  Cardiovascular:     Rate and Rhythm: Normal rate and regular rhythm.     Pulses: Normal pulses.  Pulmonary:     Effort: Pulmonary effort is normal.     Breath sounds: Normal breath sounds.  Abdominal:     General: Bowel sounds are normal. There is no distension.     Palpations: Abdomen is soft.     Tenderness: There is no abdominal tenderness.  Neurological:     General: No focal deficit present.     Mental Status: She is alert and oriented to person, place, and time.  Psychiatric:        Mood and Affect: Mood normal.           Assessment & Plan:  .Marland Kitchen. Marland Kitchen.[name].Milton was seen today for follow-up.  Diagnoses and all orders for this visit:  Panic attacks -     CBC with Differential/Platelet -     TSH -     Ferritin -     PARoxetine (PAXIL) 10 MG tablet; Take 1 tablet (10 mg total) by mouth daily. -     clonazePAM (KLONOPIN) 0.5 MG tablet; Take 0.5 tablets (0.25 mg total) by mouth 2 (two) times daily as needed for anxiety. -     Ambulatory referral to Psychology  Unexplained weight loss -  CBC with Differential/Platelet -     TSH -     Ferritin  Gastroesophageal reflux disease with esophagitis -     famotidine (PEPCID) 20 MG tablet; Take 1 tablet (20 mg total) by mouth 2 (two) times daily. -     Ambulatory referral to Gastroenterology  Anxiety and depression -     PARoxetine (PAXIL) 10 MG tablet; Take 1 tablet (10 mg total) by mouth daily. -     Ambulatory referral to Psychology  Nightmares      .Marland Kitchen Depression screen PHQ 2/9 04/29/2018  Decreased Interest 2  Down, Depressed, Hopeless 3  PHQ - 2 Score 5  Altered sleeping 3  Tired, decreased energy 3  Change in appetite 0  Feeling bad or failure about yourself  2  Trouble concentrating 1  Moving slowly or fidgety/restless 2  Suicidal thoughts 0  PHQ-9 Score 16  Difficult doing work/chores Very difficult   .Marland Kitchen GAD 7 : Generalized Anxiety Score 04/29/2018   Nervous, Anxious, on Edge 3  Control/stop worrying 3  Worry too much - different things 3  Trouble relaxing 2  Restless 0  Easily annoyed or irritable 2  Afraid - awful might happen 3  Total GAD 7 Score 16  Anxiety Difficulty Very difficult   With worsening panic attacks, nightmares, depression. Need to start medication. Discussed options. No benefit with wellbutrin in the past. Start paxil before bed. Discussed as needed klonapin as needed only. Pt aware of abuse potential. I think she would benefit from counseling. Referral made. Follow up in 4-6 weeks.   Referral for consult to treat GERD with Nissen fundoplication Dr. Seth Bake at [facility].

## 2018-04-29 NOTE — Telephone Encounter (Signed)
I received a fax request from patient's pharmacy stating that the Famotidine is not covered by patients insurance, but the alternative medication Nizatidine is covered. Just wanted to see if we could send that over for the patient instead. Thanks!

## 2018-04-29 NOTE — Addendum Note (Signed)
Addended by: Myer Peer on: 04/29/2018 02:29 PM   Modules accepted: Orders

## 2018-04-29 NOTE — Telephone Encounter (Signed)
Ok for 150mg  bid #60 5 refills.

## 2018-04-30 LAB — CBC WITH DIFFERENTIAL/PLATELET
ABSOLUTE MONOCYTES: 392 {cells}/uL (ref 200–950)
BASOS PCT: 0.8 %
Basophils Absolute: 39 cells/uL (ref 0–200)
Eosinophils Absolute: 69 cells/uL (ref 15–500)
Eosinophils Relative: 1.4 %
HEMATOCRIT: 42.8 % (ref 35.0–45.0)
HEMOGLOBIN: 14.4 g/dL (ref 11.7–15.5)
LYMPHS ABS: 1671 {cells}/uL (ref 850–3900)
MCH: 31.4 pg (ref 27.0–33.0)
MCHC: 33.6 g/dL (ref 32.0–36.0)
MCV: 93.4 fL (ref 80.0–100.0)
MPV: 10.6 fL (ref 7.5–12.5)
Monocytes Relative: 8 %
NEUTROS ABS: 2729 {cells}/uL (ref 1500–7800)
NEUTROS PCT: 55.7 %
Platelets: 322 10*3/uL (ref 140–400)
RBC: 4.58 10*6/uL (ref 3.80–5.10)
RDW: 11.5 % (ref 11.0–15.0)
Total Lymphocyte: 34.1 %
WBC: 4.9 10*3/uL (ref 3.8–10.8)

## 2018-04-30 LAB — FERRITIN: Ferritin: 89 ng/mL (ref 16–154)

## 2018-04-30 LAB — TSH: TSH: 1.92 mIU/L

## 2018-05-01 NOTE — Progress Notes (Signed)
Call pt: labs look great. No anemia. Thyroid good. Iron stores great.

## 2018-05-02 ENCOUNTER — Encounter: Payer: Self-pay | Admitting: Physician Assistant

## 2018-05-02 ENCOUNTER — Encounter: Payer: Self-pay | Admitting: Gastroenterology

## 2018-05-02 DIAGNOSIS — F515 Nightmare disorder: Secondary | ICD-10-CM | POA: Insufficient documentation

## 2018-05-02 DIAGNOSIS — R634 Abnormal weight loss: Secondary | ICD-10-CM | POA: Insufficient documentation

## 2018-05-02 DIAGNOSIS — F410 Panic disorder [episodic paroxysmal anxiety] without agoraphobia: Secondary | ICD-10-CM | POA: Insufficient documentation

## 2018-05-13 ENCOUNTER — Ambulatory Visit: Payer: Federal, State, Local not specified - PPO | Admitting: Gastroenterology

## 2018-05-13 ENCOUNTER — Encounter: Payer: Self-pay | Admitting: Gastroenterology

## 2018-05-13 VITALS — BP 100/70 | HR 74 | Ht 66.0 in | Wt 114.4 lb

## 2018-05-13 DIAGNOSIS — R112 Nausea with vomiting, unspecified: Secondary | ICD-10-CM | POA: Diagnosis not present

## 2018-05-13 DIAGNOSIS — R1013 Epigastric pain: Secondary | ICD-10-CM

## 2018-05-13 DIAGNOSIS — K219 Gastro-esophageal reflux disease without esophagitis: Secondary | ICD-10-CM | POA: Diagnosis not present

## 2018-05-13 MED ORDER — FAMOTIDINE 40 MG PO TABS: 40.0000 mg | ORAL_TABLET | Freq: Every day | ORAL | 2 refills | Status: DC

## 2018-05-13 NOTE — Progress Notes (Signed)
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 She states she has been taking Zegrid since approx 2011, but recently changed to Omeprazole d/t insurance.   Index sxs of dyspepsia, nausea and visible abdominal distension. No HB, regurgitation, sour brash or belch. Only relief is with self-induced emesis.  Emesis contents typically bilious yellow/orange.  Emesis almost always self-induced because that will relieve symptoms the fastest.  Sxs typically middle of the night, waking from sleep, or postprandial.  Postprandial symptoms typically 1 hour after ingestion, but can be within minutes. Worse with tomato based foods, spicy, fatty/fried, EtOH.  No dysphagia or odynophagia.  No abdominal pain.  She has trialed multiple acid suppression agents, to include Protonix, Dexilant, Zegerid, Nexium, and now Prilosec.  Ulcer on tip of tongue.  Will intermittently have oral/gingival ulcers.  Has never been told she has enamel erosion.  She separately endorses a longstanding history of constipation since infancy.  This is unchanged from chronic symptoms and largely controlled with dietary modifications.  Has been evaluated by GI in [place] and [place] in the past, with reportedly normal EGD in the past and normal GES.  Was last seen by outside GI in 10/2015.  Interestingly there notes report heartburn, acid brash at that time, with recommendation for repeat EGD for ongoing symptoms despite high-dose aspiration therapy along with consideration for pH/impedance study and EM.  This was never repeated per notes.  GI evaluation history: -EGD (09/01/2013, [facility]): Duodenal nodule (lymphangiectasia with benign biopsy), otherwise normal duodenum with biopsies negative for Celiac Disease.  Otherwise normal - RUQ ultrasound  (09/2015): Normal - GES (10/2015): Normal  Recent normal CBC and TSH.  No recent abdominal imaging for review today.   Past Medical History:  Diagnosis Date  . Acne   . Anxiety and depression 05/02/2013   Intolerant to lexapro and effexor   . GERD 10/19/2009   Failed omeprazole and protonix.   Marland Kitchen. GERD (gastroesophageal reflux disease)   . Mouth ulcer      Past Surgical History:  Procedure Laterality Date  . ESOPHAGOGASTRODUODENOSCOPY     x2 have been normal and gastric emptying has been normal    Family History  Problem Relation Age of Onset  . Depression Mother   . Heart attack Father   . Diabetes Father        prediabetic  . Hypertension Father   . Hyperlipidemia Father   . Diabetes Paternal Grandmother   . Colon cancer Neg Hx   . Esophageal cancer Neg Hx    Social History   Tobacco Use  . Smoking status: Never Smoker  . Smokeless tobacco: Never Used  Substance Use Topics  . Alcohol use: Yes    Comment: Hard selzer once very 3 months. Cant tolerate alcoloh  . Drug use: No   Current Outpatient Medications  Medication Sig Dispense Refill  . clobetasol (OLUX) 0.05 % topical foam Apply topically 2 (two) times daily. 50 g 5  . clonazePAM (KLONOPIN) 0.5 MG tablet Take 0.5 tablets (0.25 mg total) by mouth 2 (two) times daily as needed for anxiety. 20 tablet 0  . Dapsone 5 % topical gel APPLY TOPICALLY TO THE AFFECTED AREA TWICE DAILY    . drospirenone-ethinyl estradiol (YAZ,GIANVI,LORYNA) 3-0.02 MG  tablet Take 1 tablet by mouth daily. 30 tablet 11  . famotidine (PEPCID) 40 MG tablet Take 1 tablet (40 mg total) by mouth daily. 30 tablet 2  . hydrocortisone (ANUSOL-HC) 2.5 % rectal cream Place 1 application rectally daily. 30 g 5  . nizatidine (AXID) 150 MG capsule Take 1 capsule (150 mg total) by mouth 2 (two) times daily. 60 capsule 5  . omeprazole (PRILOSEC) 40 MG capsule Take 1 capsule (40 mg total) by mouth daily. 90 capsule 3  . ondansetron (ZOFRAN) 4 MG tablet Take 1  tablet (4 mg total) by mouth every 8 (eight) hours as needed for Nausea. 20 tablet 0  . PARoxetine (PAXIL) 10 MG tablet Take 1 tablet (10 mg total) by mouth daily. 30 tablet 1  . Probiotic Product (PROBIOTIC DAILY PO) Take by mouth.    . spironolactone (ALDACTONE) 100 MG tablet Take 100 mg by mouth daily.     No current facility-administered medications for this visit.    Allergies  Allergen Reactions  . Augmentin [Amoxicillin-Pot Clavulanate]     Vomiting when taken 10 years ago. Has taken amoxicillin without problems.     Review of Systems: All systems reviewed and negative except where noted in HPI.     Physical Exam:    Wt Readings from Last 3 Encounters:  05/13/18 114 lb 6 oz (51.9 kg)  04/29/18 115 lb (52.2 kg)  12/14/17 113 lb (51.3 kg)    BP 100/70   Pulse 74   Ht 5\' 6"  (1.676 m)   Wt 114 lb 6 oz (51.9 kg)   BMI 18.46 kg/m  Constitutional:  Pleasant, in no acute distress. Psychiatric: Normal mood and affect. Behavior is normal. EENT: Pupils normal.  Conjunctivae are normal. No scleral icterus.  Ulcer on tip of tongue. Neck supple. No cervical LAD. Cardiovascular: Normal rate, regular rhythm. No edema Pulmonary/chest: Effort normal and breath sounds normal. No wheezing, rales or rhonchi. Abdominal: Soft, nondistended, nontender. Bowel sounds active throughout. There are no masses palpable. No hepatomegaly. Neurological: Alert and oriented to person place and time. Skin: Skin is warm and dry. No rashes noted.   ASSESSMENT AND PLAN;   Andrea Baker is a 28 y.o. female presenting with:  1) Nausea/vomiting: Nausea and abdominal distention which is relieved with emesis.  At this time, unclear as to whether etiology is mucosal/luminal versus extraintestinal.  Given duration of symptoms and quite bothersome nature, agree with previous recommendations for EGD to evaluate for mucosal/luminal etiology to include PUD, gastritis, H. Pylori ,GOO, etc. additionally, she has  an ulcer on the tip of her tongue and prior history of oral/gingival ulcers.  Query upper GI Crohn's disease and will evaluate time of EGD.   - EGD w/ bxs -Previous GES normal.  No plan to repeat at this time. -If evaluation ultimately unrevealing, can consider cross-sectional imaging.  2) Reflux: Reports longstanding history of reflux symptoms, but symptoms reported today are more atypical in nature.  Of note, notes from prior GI do report some more typical reflux symptoms to include heartburn and waterbrash.  Symptoms responsive to aspiration therapy but certainly not resolved.  - EGD to evaluate for erosive esophagitis, LES laxity, hiatal hernia, etc. -If EGD unrevealing, recommended pH/impedance vs Bravo study  3) Dyspepsia: Evaluate for mucosal etiology at time of EGD with biopsies as above.  The indications, risks, and benefits of EGD were explained to the patient in detail. Risks include but are not limited to bleeding, perforation, adverse reaction to  medications, and cardiopulmonary compromise. Sequelae include but are not limited to the possibility of surgery, hositalization, and mortality. The patient verbalized understanding and wished to proceed. All questions answered, referred to scheduler. Further recommendations pending results of the exam.     Shellia Cleverly, DO, FACG  05/13/2018, 3:28 PM   Breeback, Lonna Cobb, PA-C

## 2018-05-13 NOTE — Addendum Note (Signed)
Addended by: Arvilla Market on: 05/13/2018 11:26 AM   Modules accepted: Orders

## 2018-05-13 NOTE — Patient Instructions (Addendum)
If you are age 28 or older, your body mass index should be between 23-30. Your Body mass index is 18.46 kg/m. If this is out of the aforementioned range listed, please consider follow up with your Primary Care Provider.  If you are age 49 or younger, your body mass index should be between 19-25. Your Body mass index is 18.46 kg/m. If this is out of the aformentioned range listed, please consider follow up with your Primary Care Provider.   You have been scheduled for an endoscopy. Please follow written instructions given to you at your visit today. If you use inhalers (even only as needed), please bring them with you on the day of your procedure. Your physician has requested that you go to www.startemmi.com and enter the access code given to you at your visit today. This web site gives a general overview about your procedure. However, you should still follow specific instructions given to you by our office regarding your preparation for the procedure.  Please call our office at [phone] to set up your 3 month follow up visit.  It was a pleasure to see you today!  Vito Cirigliano, D.O.

## 2018-05-26 ENCOUNTER — Ambulatory Visit (AMBULATORY_SURGERY_CENTER): Payer: Federal, State, Local not specified - PPO | Admitting: Gastroenterology

## 2018-05-26 ENCOUNTER — Other Ambulatory Visit: Payer: Self-pay

## 2018-05-26 ENCOUNTER — Encounter: Payer: Self-pay | Admitting: Gastroenterology

## 2018-05-26 VITALS — BP 104/62 | HR 65 | Temp 98.6°F | Resp 11 | Ht 66.0 in | Wt 114.0 lb

## 2018-05-26 DIAGNOSIS — K2970 Gastritis, unspecified, without bleeding: Secondary | ICD-10-CM | POA: Diagnosis not present

## 2018-05-26 DIAGNOSIS — K3189 Other diseases of stomach and duodenum: Secondary | ICD-10-CM

## 2018-05-26 DIAGNOSIS — K219 Gastro-esophageal reflux disease without esophagitis: Secondary | ICD-10-CM

## 2018-05-26 DIAGNOSIS — R1013 Epigastric pain: Secondary | ICD-10-CM | POA: Diagnosis not present

## 2018-05-26 DIAGNOSIS — K2950 Unspecified chronic gastritis without bleeding: Secondary | ICD-10-CM | POA: Diagnosis not present

## 2018-05-26 DIAGNOSIS — R112 Nausea with vomiting, unspecified: Secondary | ICD-10-CM

## 2018-05-26 MED ORDER — SODIUM CHLORIDE 0.9 % IV SOLN
500.0000 mL | Freq: Once | INTRAVENOUS | Status: DC
Start: 2018-05-26 — End: 2018-05-26

## 2018-05-26 NOTE — Patient Instructions (Signed)
Discharge instructions given. Biopsies taken. Resume previous medications. Office will schedule Manometry. YOU HAD AN ENDOSCOPIC PROCEDURE TODAY AT THE [facility]:   Refer to the procedure report that was given to you for any specific questions about what was found during the examination.  If the procedure report does not answer your questions, please call your gastroenterologist to clarify.  If you requested that your care partner not be given the details of your procedure findings, then the procedure report has been included in a sealed envelope for you to review at your convenience later.  YOU SHOULD EXPECT: Some feelings of bloating in the abdomen. Passage of more gas than usual.  Walking can help get rid of the air that was put into your GI tract during the procedure and reduce the bloating. If you had a lower endoscopy (such as a colonoscopy or flexible sigmoidoscopy) you may notice spotting of blood in your stool or on the toilet paper. If you underwent a bowel prep for your procedure, you may not have a normal bowel movement for a few days.  Please Note:  You might notice some irritation and congestion in your nose or some drainage.  This is from the oxygen used during your procedure.  There is no need for concern and it should clear up in a day or so.  SYMPTOMS TO REPORT IMMEDIATELY:   Following upper endoscopy (EGD)  Vomiting of blood or coffee ground material  New chest pain or pain under the shoulder blades  Painful or persistently difficult swallowing  New shortness of breath  Fever of 100F or higher  Black, tarry-looking stools  For urgent or emergent issues, a gastroenterologist can be reached at any hour by calling [phone].   DIET:  We do recommend a small meal at first, but then you may proceed to your regular diet.  Drink plenty of fluids but you should avoid alcoholic beverages for 24 hours.  ACTIVITY:  You should plan to take it easy for the rest of  today and you should NOT DRIVE or use heavy machinery until tomorrow (because of the sedation medicines used during the test).    FOLLOW UP: Our staff will call the number listed on your records the next business day following your procedure to check on you and address any questions or concerns that you may have regarding the information given to you following your procedure. If we do not reach you, we will leave a message.  However, if you are feeling well and you are not experiencing any problems, there is no need to return our call.  We will assume that you have returned to your regular daily activities without incident.  If any biopsies were taken you will be contacted by phone or by letter within the next 1-3 weeks.  Please call us at [phone] if you have not heard about the biopsies in 3 weeks.    SIGNATURES/CONFIDENTIALITY: You and/or your care partner have signed paperwork which will be entered into your electronic medical record.  These signatures attest to the fact that that the information above on your After Visit Summary has been reviewed and is understood.  Full responsibility of the confidentiality of this discharge information lies with you and/or your care-partner.

## 2018-05-26 NOTE — Op Note (Signed)
[facility] Patient Name: Andrea Baker Procedure Date: 05/26/2018 10:44 AM MRN: [id number] Endoscopist: Doristine Locks , MD Age: 28 Referring MD:  Date of Birth: 1990/06/19 Gender: Female Account #: [id number] Procedure:                Upper GI endoscopy Indications:              Epigastric abdominal pain, Suspected esophageal                            reflux, Dyspepsia, Nausea with vomiting Medicines:                Monitored Anesthesia Care Procedure:                Pre-Anesthesia Assessment:                           - Prior to the procedure, a History and Physical                            was performed, and patient medications and                            allergies were reviewed. The patient's tolerance of                            previous anesthesia was also reviewed. The risks                            and benefits of the procedure and the sedation                            options and risks were discussed with the patient.                            All questions were answered, and informed consent                            was obtained. Prior Anticoagulants: The patient has                            taken no previous anticoagulant or antiplatelet                            agents. ASA Grade Assessment: II - A patient with                            mild systemic disease. After reviewing the risks                            and benefits, the patient was deemed in                            satisfactory condition to undergo the procedure.  After obtaining informed consent, the endoscope was                            passed under direct vision. Throughout the                            procedure, the patient's blood pressure, pulse, and                            oxygen saturations were monitored continuously. The                            Endoscope was introduced through the mouth, and                            advanced to the  second part of duodenum. The upper                            GI endoscopy was accomplished without difficulty.                            The patient tolerated the procedure well. Scope In: Scope Out: Findings:                 The examined esophagus was normal.                           Esophagogastric landmarks were identified: the                            Z-line was found at 40 cm, the gastroesophageal                            junction was found at 40 cm and the site of hiatal                            narrowing was found at 40 cm from the incisors.                           The gastroesophageal flap valve was visualized                            endoscopically and classified as Hill Grade I                            (prominent fold, tight to endoscope).                           The entire examined stomach was normal. Biopsies                            were taken with a cold forceps for Helicobacter  pylori testing. Estimated blood loss was minimal.                           The duodenal bulb, first portion of the duodenum                            and second portion of the duodenum were normal. Complications:            No immediate complications. Estimated Blood Loss:     Estimated blood loss was minimal. Impression:               - Normal esophagus.                           - Esophagogastric landmarks identified.                           - Gastroesophageal flap valve classified as Hill                            Grade I (prominent fold, tight to endoscope).                           - Normal stomach. Biopsied.                           - Normal duodenal bulb, first portion of the                            duodenum and second portion of the duodenum. Recommendation:           - Patient has a contact number available for                            emergencies. The signs and symptoms of potential                            delayed complications  were discussed with the                            patient. Return to normal activities tomorrow.                            Written discharge instructions were provided to the                            patient.                           - Resume previous diet today.                           - Continue present medications.                           - Await pathology results.                           -  Perform Esophageal Manometry and ambulatory pH                            and impedance monitoring at appointment to be                            scheduled. This should be done OFF PPI x7 days.                           - Follow-up in the GI Clinic after completion of                            the pH/Impedance testing to review results and                            ongoing medical management. Doristine Locks, MD 05/26/2018 11:00:33 AM

## 2018-05-26 NOTE — Progress Notes (Signed)
Pt and procedure verified with physician when called to rm to prepare pathology

## 2018-05-26 NOTE — Progress Notes (Signed)
To PACU, VSS. Report to Rn.tb 

## 2018-05-27 ENCOUNTER — Telehealth: Payer: Self-pay | Admitting: *Deleted

## 2018-05-27 ENCOUNTER — Other Ambulatory Visit: Payer: Self-pay

## 2018-05-27 DIAGNOSIS — R112 Nausea with vomiting, unspecified: Secondary | ICD-10-CM

## 2018-05-27 DIAGNOSIS — R1013 Epigastric pain: Secondary | ICD-10-CM

## 2018-05-27 DIAGNOSIS — K219 Gastro-esophageal reflux disease without esophagitis: Secondary | ICD-10-CM

## 2018-05-27 NOTE — Telephone Encounter (Signed)
  Follow up Call-  Call back number 05/26/2018  Post procedure Call Back phone  # [phone]  Permission to leave phone message Yes  Some recent data might be hidden     Patient questions:  Do you have a fever, pain , or abdominal swelling? No. Pain Score  0 *  Have you tolerated food without any problems? Yes.    Have you been able to return to your normal activities? Yes.    Do you have any questions about your discharge instructions: Diet   No. Medications  No. Follow up visit  No.  Do you have questions or concerns about your Care? Yes.   Wanted to know when she would hear from biospsy.  Advised she should hear within 2-3 weeks.. Actions: * If pain score is 4 or above: No action needed, pain <4.

## 2018-05-27 NOTE — Progress Notes (Addendum)
Patient notified via letter for esophageal manometry scheduled on 06/20/2018 procedure time 8:30am;   Case ID# [id number]

## 2018-06-03 ENCOUNTER — Encounter: Payer: Self-pay | Admitting: Physician Assistant

## 2018-06-03 ENCOUNTER — Ambulatory Visit (INDEPENDENT_AMBULATORY_CARE_PROVIDER_SITE_OTHER): Payer: Federal, State, Local not specified - PPO | Admitting: Physician Assistant

## 2018-06-03 ENCOUNTER — Other Ambulatory Visit: Payer: Self-pay

## 2018-06-03 VITALS — BP 111/70 | HR 65 | Ht 66.0 in | Wt 117.0 lb

## 2018-06-03 DIAGNOSIS — F515 Nightmare disorder: Secondary | ICD-10-CM

## 2018-06-03 DIAGNOSIS — F410 Panic disorder [episodic paroxysmal anxiety] without agoraphobia: Secondary | ICD-10-CM | POA: Diagnosis not present

## 2018-06-03 DIAGNOSIS — F419 Anxiety disorder, unspecified: Secondary | ICD-10-CM

## 2018-06-03 DIAGNOSIS — F5101 Primary insomnia: Secondary | ICD-10-CM

## 2018-06-03 DIAGNOSIS — F329 Major depressive disorder, single episode, unspecified: Secondary | ICD-10-CM

## 2018-06-03 MED ORDER — TRAZODONE HCL 50 MG PO TABS: 50.0000 mg | ORAL_TABLET | Freq: Every day | ORAL | 2 refills | Status: DC

## 2018-06-03 MED ORDER — VORTIOXETINE HBR 10 MG PO TABS: 10.0000 mg | ORAL_TABLET | Freq: Every day | ORAL | 1 refills | Status: DC

## 2018-06-03 NOTE — Progress Notes (Signed)
Subjective:    Patient ID: Andrea Baker, female    DOB: 1991/02/23, 28 y.o.   MRN: [id number]  HPI  Pt is a 28 yo female who presents to the clinic to follow up on depression, anxiety, panic attacks. She was started on paxil about 4 weeks ago. She felt like it helped her anxiety but left her feeling "blunted and sad". She feels like she was much more tearful. Anytime she would experience disappointment she would just cry. She has also failed leaxpro. She did not have any panic attacks. No SI/HC.   Marland Kitchen. Active Ambulatory Problems    Diagnosis Date Noted  . GERD 10/19/2009  . Anxiety and depression 05/02/2013  . Concentration deficit 05/02/2013  . Eczema 05/17/2013  . Chronic idiopathic constipation 06/12/2015  . 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 Seborrheic dermatitis of scalp 08/11/2016  . Grade I hemorrhoids 06/08/2017  . Panic attacks 05/02/2018  . Unexplained weight loss 05/02/2018  . Nightmares 05/02/2018  . Primary insomnia 06/06/2018   Resolved Ambulatory Problems    Diagnosis Date Noted  . Hematuria 10/31/2014   Past Medical History:  Diagnosis Date  . Acne   . GERD (gastroesophageal reflux disease)   . Mouth ulcer      Review of Systems See HPI>     Objective:   Physical Exam Vitals signs reviewed.  Constitutional:      Appearance: Normal appearance.  HENT:     Head: Normocephalic and atraumatic.  Cardiovascular:     Rate and Rhythm: Normal rate and regular rhythm.     Pulses: Normal pulses.  Pulmonary:     Effort: Pulmonary effort is normal.  Neurological:     General: No focal deficit present.     Mental Status: She is alert and oriented to person, place, and time.  Psychiatric:        Mood and Affect: Mood normal.           Assessment & Plan:  Marland KitchenMarland KitchenLoanna was seen today for depression.  Diagnoses and all orders for this visit:  Anxiety and depression -     vortioxetine HBr (TRINTELLIX) 10 MG TABS tablet; Take 1 tablet (10 mg total) by mouth  daily.  Panic attacks -     vortioxetine HBr (TRINTELLIX) 10 MG TABS tablet; Take 1 tablet (10 mg total) by mouth daily.  Primary insomnia -     traZODone (DESYREL) 50 MG tablet; Take 1 tablet (50 mg total) by mouth at bedtime.  Nightmares   .Marland Kitchen Depression screen [facility] 2/9 06/03/2018 04/29/2018  Decreased Interest 3 2  Down, Depressed, Hopeless 3 3  PHQ - 2 Score 6 5  Altered sleeping 3 3  Tired, decreased energy 3 3  Change in appetite 1 0  Feeling bad or failure about yourself  3 2  Trouble concentrating 1 1  Moving slowly or fidgety/restless 2 2  Suicidal thoughts 1 0  PHQ-9 Score 20 16  Difficult doing work/chores Very difficult Very difficult   .Marland Kitchen GAD 7 : Generalized Anxiety Score 06/03/2018 04/29/2018  Nervous, Anxious, on Edge 2 3  Control/stop worrying 2 3  Worry too much - different things 3 3  Trouble relaxing 2 2  Restless 0 0  Easily annoyed or irritable 3 2  Afraid - awful might happen 3 3  Total GAD 7 Score 15 16  Anxiety Difficulty Very difficult Very difficult    Anxiety numbers improved some. Depression numbers worsened.  Stop paxil. Failed lexapro as well.  Will try trintellix. Discussed side effects. Follow up in 4-6 weeks.  Will add trazodone to help sleep.  Strongly encouraged counseling. Pt will look into counselors where she works in [place] so she could go on her lunch break.

## 2018-06-05 ENCOUNTER — Encounter: Payer: Self-pay | Admitting: Physician Assistant

## 2018-06-06 ENCOUNTER — Encounter: Payer: Self-pay | Admitting: Physician Assistant

## 2018-06-06 ENCOUNTER — Telehealth: Payer: Self-pay | Admitting: Gastroenterology

## 2018-06-06 DIAGNOSIS — G4700 Insomnia, unspecified: Secondary | ICD-10-CM | POA: Insufficient documentation

## 2018-06-06 DIAGNOSIS — F5101 Primary insomnia: Secondary | ICD-10-CM | POA: Insufficient documentation

## 2018-06-06 NOTE — Telephone Encounter (Signed)
Francesco Runner can we look into this PA.

## 2018-06-06 NOTE — Telephone Encounter (Signed)
The pt was advised why she is having mano and pH.  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  The pt has been advised of the information and verbalized understanding.

## 2018-06-06 NOTE — Telephone Encounter (Signed)
Pt has not been fully told for her upcoming appointment on 3-30  At [facility] other than just a letter. Would like to also know why she is scheduled for that procedure.

## 2018-06-09 ENCOUNTER — Encounter: Payer: Self-pay | Admitting: Gastroenterology

## 2018-06-13 ENCOUNTER — Other Ambulatory Visit: Payer: Self-pay | Admitting: Physician Assistant

## 2018-06-13 ENCOUNTER — Encounter: Payer: Self-pay | Admitting: Physician Assistant

## 2018-06-14 MED ORDER — ONDANSETRON HCL 4 MG PO TABS: 4.0000 mg | ORAL_TABLET | Freq: Once | ORAL | 1 refills | Status: AC

## 2018-06-22 ENCOUNTER — Encounter: Payer: Self-pay | Admitting: Physician Assistant

## 2018-06-27 ENCOUNTER — Other Ambulatory Visit: Payer: Self-pay | Admitting: Physician Assistant

## 2018-06-27 DIAGNOSIS — F32A Depression, unspecified: Secondary | ICD-10-CM

## 2018-06-27 DIAGNOSIS — F410 Panic disorder [episodic paroxysmal anxiety] without agoraphobia: Secondary | ICD-10-CM

## 2018-06-27 DIAGNOSIS — F329 Major depressive disorder, single episode, unspecified: Secondary | ICD-10-CM

## 2018-06-27 DIAGNOSIS — F419 Anxiety disorder, unspecified: Secondary | ICD-10-CM

## 2018-06-28 ENCOUNTER — Ambulatory Visit: Payer: Federal, State, Local not specified - PPO | Admitting: Gastroenterology

## 2018-07-01 MED ORDER — VENLAFAXINE HCL ER 37.5 MG PO CP24
37.5000 mg | ORAL_CAPSULE | Freq: Every day | ORAL | 0 refills | Status: DC
Start: 2018-07-01 — End: 2018-10-11

## 2018-07-01 NOTE — Addendum Note (Signed)
Addended by: Jomarie Longs on: 07/01/2018 01:52 PM   Modules accepted: Orders

## 2018-07-11 ENCOUNTER — Encounter: Payer: Self-pay | Admitting: Physician Assistant

## 2018-07-11 DIAGNOSIS — F410 Panic disorder [episodic paroxysmal anxiety] without agoraphobia: Secondary | ICD-10-CM

## 2018-07-11 DIAGNOSIS — L710 Perioral dermatitis: Secondary | ICD-10-CM | POA: Diagnosis not present

## 2018-07-11 DIAGNOSIS — F329 Major depressive disorder, single episode, unspecified: Secondary | ICD-10-CM

## 2018-07-11 DIAGNOSIS — F515 Nightmare disorder: Secondary | ICD-10-CM

## 2018-07-11 DIAGNOSIS — F5101 Primary insomnia: Secondary | ICD-10-CM

## 2018-07-11 DIAGNOSIS — F419 Anxiety disorder, unspecified: Secondary | ICD-10-CM

## 2018-07-11 DIAGNOSIS — L219 Seborrheic dermatitis, unspecified: Secondary | ICD-10-CM | POA: Diagnosis not present

## 2018-07-11 DIAGNOSIS — R41840 Attention and concentration deficit: Secondary | ICD-10-CM

## 2018-07-21 ENCOUNTER — Encounter (HOSPITAL_COMMUNITY): Payer: Self-pay | Admitting: Psychiatry

## 2018-07-21 ENCOUNTER — Encounter: Payer: Self-pay | Admitting: Physician Assistant

## 2018-07-21 ENCOUNTER — Ambulatory Visit (INDEPENDENT_AMBULATORY_CARE_PROVIDER_SITE_OTHER): Payer: Federal, State, Local not specified - PPO | Admitting: Psychiatry

## 2018-07-21 VITALS — Ht 66.0 in | Wt 120.0 lb

## 2018-07-21 DIAGNOSIS — F331 Major depressive disorder, recurrent, moderate: Secondary | ICD-10-CM

## 2018-07-21 DIAGNOSIS — F410 Panic disorder [episodic paroxysmal anxiety] without agoraphobia: Secondary | ICD-10-CM

## 2018-07-21 DIAGNOSIS — F411 Generalized anxiety disorder: Secondary | ICD-10-CM

## 2018-07-21 MED ORDER — MIRTAZAPINE 7.5 MG PO TABS: 7.5000 mg | ORAL_TABLET | Freq: Every day | ORAL | 0 refills | Status: DC

## 2018-07-21 NOTE — Progress Notes (Signed)
Psychiatric Initial Adult Assessment   Patient Identification: Andrea Baker MRN:  [id number] Date of Evaluation:  07/21/2018 Referral Source: Ezra Sites, primary care Chief Complaint:   Chief Complaint    Panic Attack; Establish Care; Depression     Visit Diagnosis:    ICD-10-CM   1. MDD (major depressive disorder), recurrent episode, moderate (HCC) F33.1   2. GAD (generalized anxiety disorder) F41.1   3. Panic attacks F41.0   I connected with Andrea Baker on 07/21/18 at  9:00 AM EDT by a video enabled telemedicine application and verified that I am speaking with the correct person using two identifiers.   I discussed the limitations of evaluation and management by telemedicine and the availability of in person appointments. 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   Patient has been experiencing symptoms of depression since last year.  She had been on Wellbutrin 3 years ago after a break-up in the relationship.  Paxil did not help and Wellbutrin did help some then she stopped that in the last 1 year she has noticed increased symptoms of depression and also occasional panic-like attacks which work stress related as well and the relationship of current   Patient was then given Lexapro also did not help she had side effects she is sensitive to medication has GI side effects.  She was also given Klonopin for as needed basis.  She endorses sadness in her sadness decreased energy decreased interest withdrawn feeling loneliness and at times feeling emptiness.  She was started recently on Trintellix but had GI side effects again and nausea it was changed to Effexor Effexor did help some but she had constipation so with  those medication other she would have constipation or diarrhea.  She is after medication wants to try something that would not affect her GI She is having vivid at times scary dreams as if she is trying to run away from some danger she was started on trazodone it did not help.  She denies snoring or waking up tired.  As of now she is more focused on her emptiness of sadness and wants to be on some medication which would not effect GI She denies psychotic symptoms manic symptoms or any paranoia.  Does not use any alcohol on a regular basis denies substance use  Family history Mom diagnosed with depression has been affected since that has helped some mood issues but she was growing up with him  Modifying factors; she does like her job although commute is long.  Family.  Sister.  Dog  Aggravating factors; past relationship.  She feels current relationship is going fair but not as she would like to he is a veteran and has had ongoing concerns is seeing a therapist  Duration since 2014 after the first break-up Also has seen a psychologist or psychiatrist when she was in middle school during her parents separation  Did better when her sister was here, does get lonely   Past Psychiatric History: depression, anxiety  Previous Psychotropic Medications: yes  Substance Abuse History in the last 12 months: no  Consequences of Substance Abuse: NA  Past Medical History:  Past Medical History:  Diagnosis Date  . Acne   . Anxiety and depression 05/02/2013  Intolerant to lexapro and effexor   . GERD 10/19/2009   Failed omeprazole and protonix.   Marland Kitchen. GERD (gastroesophageal reflux disease)   . Mouth ulcer     Past Surgical History:  Procedure Laterality Date  . ESOPHAGOGASTRODUODENOSCOPY     x2 have been normal and gastric emptying has been normal     Family Psychiatric History:mom : depression  Family History:  Family History  Problem Relation Age of Onset  . Depression Mother   .  Heart attack Father   . Diabetes Father        prediabetic  . Hypertension Father   . Hyperlipidemia Father   . Diabetes Paternal Grandmother   . Colon cancer Neg Hx   . Esophageal cancer Neg Hx   . Rectal cancer Neg Hx   . Stomach cancer Neg Hx     Social History:   Social History   Socioeconomic History  . Marital status: Single    Spouse name: Not on file  . Number of children: Not on file  . Years of education: Not on file  . Highest education level: Not on file  Occupational History  . Not on file  Social Needs  . Financial resource strain: Not on file  . Food insecurity:    Worry: Not on file    Inability: Not on file  . Transportation needs:    Medical: Not on file    Non-medical: Not on file  Tobacco Use  . Smoking status: Never Smoker  . Smokeless tobacco: Never Used  Substance and Sexual Activity  . Alcohol use: Yes    Comment: Hard selzer once very 3 months. Cant tolerate alcoloh  . Drug use: No  . Sexual activity: Not Currently  Lifestyle  . Physical activity:    Days per week: Not on file    Minutes per session: Not on file  . Stress: Not on file  Relationships  . Social connections:    Talks on phone: Not on file    Gets together: Not on file    Attends religious service: Not on file    Active member of club or organization: Not on file    Attends meetings of clubs or organizations: Not on file    Relationship status: Not on file  Other Topics Concern  . Not on file  Social History Narrative   LIVES WITH PARENTS.  [employer]ENT [employer]).      Additional Social History: Grew up with parents have one sibling sister no trauma but that was made at times never content with current struggle or achievements she was a Theatre managercompetitive soccer player as well.  She feels that he was strict but he did get better when she joined college and came back with her degree and working independently   Allergies:   Allergies  Allergen Reactions  . Augmentin  [Amoxicillin-Pot Clavulanate]     Vomiting when taken 10 years ago. Has taken amoxicillin without problems.    Metabolic Disorder Labs: No results found for: HGBA1C, MPG No results found for: PROLACTIN Lab Results  Component Value Date   CHOL 153 09/29/2013   TRIG 53 09/29/2013   HDL 47 09/29/2013   CHOLHDL 3.3 09/29/2013   VLDL 11 09/29/2013   LDLCALC 95 09/29/2013   Lab Results  Component Value Date   TSH 1.92 04/29/2018    Therapeutic Level Labs: No results found for: LITHIUM No results found for: CBMZ No results found for: VALPROATE  Current Medications:  Current Outpatient Medications  Medication Sig Dispense Refill  . clobetasol (OLUX) 0.05 % topical foam Apply topically 2 (two) times daily. 50 g 5  . Dapsone 5 % topical gel APPLY TOPICALLY TO THE AFFECTED AREA TWICE DAILY    . drospirenone-ethinyl estradiol (YAZ,GIANVI,LORYNA) 3-0.02 MG tablet Take 1 tablet by mouth daily. 30 tablet 11  . famotidine (PEPCID) 40 MG tablet Take 1 tablet (40 mg total) by mouth daily. 30 tablet 2  . hydrocortisone (ANUSOL-HC) 2.5 % rectal cream Place 1 application rectally daily. 30 g 5  . mirtazapine (REMERON) 7.5 MG tablet Take 1 tablet (7.5 mg total) by mouth at bedtime. 30 tablet 0  . omeprazole (PRILOSEC) 40 MG capsule Take 1 capsule (40 mg total) by mouth daily. 90 capsule 3  . Probiotic Product (PROBIOTIC DAILY PO) Take by mouth.    . spironolactone (ALDACTONE) 100 MG tablet Take 100 mg by mouth daily.    Marland Kitchen venlafaxine XR (EFFEXOR XR) 37.5 MG 24 hr capsule Take 1 capsule (37.5 mg total) by mouth daily with breakfast. 30 capsule 0   No current facility-administered medications for this visit.       Psychiatric Specialty Exam: Review of Systems  Cardiovascular: Negative for chest pain.  Skin: Negative for rash.  Neurological: Negative for headaches.  Psychiatric/Behavioral: Positive for depression. Negative for suicidal ideas.    Height  (1.676 m), weight 120 lb (54.4  kg).Body mass index is 19.37 kg/m.  General Appearance:casual  Eye Contact:  fair  Speech: normal  Volume:decreased  Mood: sad  Affect:  congruent  Thought Process:  linear  Orientation: full  Thought Content:no pyschosis  Suicidal Thoughts: denies  Homicidal Thoughts: denies  Memory: intact  Judgement:  fair  Insight:  fair  Psychomotor Activity:  decreased  Concentration: fair                  Cognition: intact  Sleep:  Fair but have vivid dreams   Screenings: GAD-7     Office Visit from 06/03/2018 in [facility] Office Visit from 04/29/2018 in [facility]  Total GAD-7 Score  15  16    PHQ2-9     Office Visit from 06/03/2018 in [facility] Office Visit from 04/29/2018 in [facility]  PHQ-2 Total Score  6  5  PHQ-9 Total Score  20  16      Assessment and Plan: as follows MDD moderate recurrent: Discussed options she has been tried on 2 different SSRIs she is also tried on Trintellix, Effexor.  We will consider Remeron 7.5 mg small dose she can still take half of it because of some concern that she has about weight although she is under her regular weight.  It would also help her sleep and also does not have GI side effects.  If needed she can always stop it and also can consider Prozac or Zoloft or other medications  We also plan to do Gene testing once  office is open.  She will also attend therapy states that she has 6 sessions available at the [facility] In case symptoms are getting worse she can give [place] a call back also given because of her concern about sleep and GI side effects from the past    I discussed the assessment and treatment plan with the patient. The patient was provided an opportunity to ask questions and  all were answered. The patient agreed with the plan and demonstrated an understanding of the instructions.    The patient was advised to call back or seek an in-person evaluation if the symptoms worsen or if the condition fails to improve as anticipated.  I provided 50 minutes of non-face-to-face time during this encounter.  Thresa Ross, MD 4/30/20209:53 AM

## 2018-07-22 DIAGNOSIS — R000 Tachycardia, unspecified: Secondary | ICD-10-CM | POA: Diagnosis not present

## 2018-07-22 DIAGNOSIS — Z793 Long term (current) use of hormonal contraceptives: Secondary | ICD-10-CM | POA: Diagnosis not present

## 2018-07-22 DIAGNOSIS — Z881 Allergy status to other antibiotic agents status: Secondary | ICD-10-CM | POA: Diagnosis not present

## 2018-07-22 DIAGNOSIS — R112 Nausea with vomiting, unspecified: Secondary | ICD-10-CM | POA: Diagnosis not present

## 2018-07-22 DIAGNOSIS — Z79899 Other long term (current) drug therapy: Secondary | ICD-10-CM | POA: Diagnosis not present

## 2018-07-22 MED ORDER — BARO-CAT PO
10.00 | ORAL | Status: DC
Start: ? — End: 2018-07-22

## 2018-07-22 MED ORDER — Medication
Status: DC
Start: ? — End: 2018-07-22

## 2018-07-28 ENCOUNTER — Other Ambulatory Visit: Payer: Self-pay

## 2018-08-18 DIAGNOSIS — L219 Seborrheic dermatitis, unspecified: Secondary | ICD-10-CM | POA: Diagnosis not present

## 2018-08-18 DIAGNOSIS — L400 Psoriasis vulgaris: Secondary | ICD-10-CM | POA: Diagnosis not present

## 2018-08-18 DIAGNOSIS — L710 Perioral dermatitis: Secondary | ICD-10-CM | POA: Diagnosis not present

## 2018-08-18 DIAGNOSIS — L700 Acne vulgaris: Secondary | ICD-10-CM | POA: Diagnosis not present

## 2018-08-25 ENCOUNTER — Other Ambulatory Visit: Payer: Self-pay

## 2018-08-25 ENCOUNTER — Ambulatory Visit (HOSPITAL_COMMUNITY): Payer: Federal, State, Local not specified - PPO | Admitting: Psychiatry

## 2018-09-08 ENCOUNTER — Other Ambulatory Visit (HOSPITAL_COMMUNITY)
Admission: RE | Admit: 2018-09-08 | Discharge: 2018-09-08 | Disposition: A | Discharge status: Home / Self Care | Payer: Federal, State, Local not specified - PPO | Source: Ambulatory Visit | Attending: Gastroenterology | Admitting: Gastroenterology

## 2018-09-08 DIAGNOSIS — Z1159 Encounter for screening for other viral diseases: Secondary | ICD-10-CM | POA: Diagnosis not present

## 2018-09-08 LAB — SARS CORONAVIRUS 2 (TAT 6-24 HRS): SARS Coronavirus 2: NEGATIVE

## 2018-09-09 ENCOUNTER — Encounter (HOSPITAL_COMMUNITY): Payer: Self-pay | Admitting: Emergency Medicine

## 2018-09-09 ENCOUNTER — Other Ambulatory Visit: Payer: Self-pay

## 2018-09-12 ENCOUNTER — Encounter (HOSPITAL_COMMUNITY)
Admission: RE | Disposition: A | Discharge status: Home / Self Care | Payer: Self-pay | Source: Home / Self Care | Attending: Gastroenterology

## 2018-09-12 ENCOUNTER — Ambulatory Visit (HOSPITAL_COMMUNITY)
Admission: RE | Admit: 2018-09-12 | Discharge: 2018-09-12 | Disposition: A | Discharge status: Home / Self Care | Payer: Federal, State, Local not specified - PPO | Attending: Gastroenterology | Admitting: Gastroenterology

## 2018-09-12 DIAGNOSIS — K219 Gastro-esophageal reflux disease without esophagitis: Secondary | ICD-10-CM | POA: Diagnosis not present

## 2018-09-12 DIAGNOSIS — R112 Nausea with vomiting, unspecified: Secondary | ICD-10-CM | POA: Diagnosis not present

## 2018-09-12 DIAGNOSIS — R1110 Vomiting, unspecified: Secondary | ICD-10-CM

## 2018-09-12 DIAGNOSIS — R12 Heartburn: Secondary | ICD-10-CM | POA: Insufficient documentation

## 2018-09-12 HISTORY — DX: Psoriasis, unspecified: L40.9

## 2018-09-12 HISTORY — PX: PH IMPEDANCE STUDY: SHX5565

## 2018-09-12 HISTORY — DX: Constipation, unspecified: K59.00

## 2018-09-12 HISTORY — PX: ESOPHAGEAL MANOMETRY: SHX5429

## 2018-09-12 HISTORY — DX: Presence of spectacles and contact lenses: Z97.3

## 2018-09-12 SURGERY — MANOMETRY, ESOPHAGUS
Anesthesia: Choice

## 2018-09-12 MED ORDER — LIDOCAINE VISCOUS HCL 2 % MT SOLN
OROMUCOSAL | Status: AC
  Filled 2018-09-12: qty 15

## 2018-09-12 SURGICAL SUPPLY — 2 items
FACESHIELD LNG OPTICON STERILE (SAFETY) IMPLANT
GLOVE BIO SURGEON STRL SZ8 (GLOVE) ×4 IMPLANT

## 2018-09-12 NOTE — Progress Notes (Signed)
esophageal manometry done per protocol. Pt tolerated well.  Placed pH probe per protocol without difficulty.  Pt tolerated well.  Education given to pt with pH diary.  Pt verbalize understanding.  Pt to return at 1000 am on 09/13/2018 for pH probe removal.

## 2018-09-13 ENCOUNTER — Encounter (HOSPITAL_COMMUNITY): Payer: Self-pay | Admitting: Gastroenterology

## 2018-09-20 DIAGNOSIS — R1110 Vomiting, unspecified: Secondary | ICD-10-CM

## 2018-09-21 ENCOUNTER — Telehealth: Payer: Self-pay | Admitting: Gastroenterology

## 2018-09-21 NOTE — Telephone Encounter (Signed)
Patty I read the study, should be scanned in soon. I will print you a copy. Thanks

## 2018-09-21 NOTE — Telephone Encounter (Signed)
Patient had a procedure on 6-22 @[facility]  and has not heard back from anyone about the results or if she needs a f-up

## 2018-09-21 NOTE — Telephone Encounter (Signed)
Dr Silverio Decamp have you seen the pt's manometry?

## 2018-09-21 NOTE — Telephone Encounter (Signed)
Noted pt aware we will contact her as soon as the report has been scanned and recommendations made per Dr Silverio Decamp

## 2018-09-29 DIAGNOSIS — L700 Acne vulgaris: Secondary | ICD-10-CM | POA: Diagnosis not present

## 2018-09-29 DIAGNOSIS — L400 Psoriasis vulgaris: Secondary | ICD-10-CM | POA: Diagnosis not present

## 2018-09-29 DIAGNOSIS — L710 Perioral dermatitis: Secondary | ICD-10-CM | POA: Diagnosis not present

## 2018-10-02 ENCOUNTER — Other Ambulatory Visit: Payer: Self-pay | Admitting: Physician Assistant

## 2018-10-03 ENCOUNTER — Encounter (HOSPITAL_COMMUNITY): Payer: Self-pay | Admitting: Psychiatry

## 2018-10-03 ENCOUNTER — Ambulatory Visit (INDEPENDENT_AMBULATORY_CARE_PROVIDER_SITE_OTHER): Payer: Federal, State, Local not specified - PPO | Admitting: Psychiatry

## 2018-10-03 DIAGNOSIS — G4700 Insomnia, unspecified: Secondary | ICD-10-CM

## 2018-10-03 DIAGNOSIS — F331 Major depressive disorder, recurrent, moderate: Secondary | ICD-10-CM

## 2018-10-03 DIAGNOSIS — F411 Generalized anxiety disorder: Secondary | ICD-10-CM | POA: Diagnosis not present

## 2018-10-03 DIAGNOSIS — F410 Panic disorder [episodic paroxysmal anxiety] without agoraphobia: Secondary | ICD-10-CM | POA: Diagnosis not present

## 2018-10-03 NOTE — Progress Notes (Signed)
[facility] Follow up visit   Patient Identification: Andrea Baker MRN:  [id number] Date of Evaluation:  10/03/2018 Referral Source: Ezra SitesJade Breback, primary care Chief Complaint:   anxiety follow up  Visit Diagnosis:    ICD-10-CM   1. MDD (major depressive disorder), recurrent episode, moderate (HCC)  F33.1   2. GAD (generalized anxiety disorder)  F41.1   3. Panic attacks  F41.0   4. Insomnia, unspecified type  G47.00     I connected with Andrea Baker on 10/03/18 at  3:30 PM EDT by a video enabled telemedicine application and verified that I am speaking with the correct person using two identifiers.    I discussed the limitations of evaluation and management by telemedicine and the availability of in person appointments. 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   Patient has been experiencing symptoms of depression since last year.  Had tried wellbutrin, lexapro, paxil in the past Last visit I started remeron but she stopped due to food craving Has constipation with most other meds effexor helped but had constipation Also in therapy Also had used trintellix More worried about vivid dreams    She denies snoring or waking up tired.  She denies psychotic symptoms manic symptoms or any paranoia.  Does not use any alcohol on a regular basis denies substance use  Family history Mom diagnosed with depression has been affected since that has helped some mood issues but she was growing up with him  Modifying factors; sister, dog Aggravating factors; past relationship.  She feels current relationship is going fair but not as she would like to he is a veteran and has had ongoing concerns is seeing a  therapist  Duration since 2014 after the first break-up Also has seen a psychologist or psychiatrist when she was in middle school during her parents separation  Did better when her sister was here, does get lonely   Past Psychiatric History: depression, anxiety  Previous Psychotropic Medications: yes  Substance Abuse History in the last 12 months: no  Consequences of Substance Abuse: NA  Past Medical History:  Past Medical History:  Diagnosis Date  . Acne   . Anxiety and depression 05/02/2013   Intolerant to lexapro and effexor   . Constipation   . GERD 10/19/2009   Failed omeprazole and protonix.   Marland Kitchen. GERD (gastroesophageal reflux disease)   . Mouth ulcer   . Psoriasis    of the scalp  . Wears contact lenses   . Wears glasses     Past Surgical History:  Procedure Laterality Date  . ESOPHAGEAL MANOMETRY N/A 09/12/2018   Procedure: ESOPHAGEAL MANOMETRY (EM);  Surgeon: Napoleon FormNandigam, Kavitha V, MD;  Location: [facility];  Service: Endoscopy;  Laterality: N/A;  . ESOPHAGOGASTRODUODENOSCOPY     x2 have been normal and gastric emptying has been normal   . ESOPHAGOGASTRODUODENOSCOPY    . gastric empyting study     . PH IMPEDANCE STUDY N/A 09/12/2018   Procedure: PH IMPEDANCE STUDY;  Surgeon: Napoleon FormNandigam, Kavitha V, MD;  Location: [facility];  Service: Endoscopy;  Laterality: N/A;    Family Psychiatric History:mom : depression  Family History:  Family History  Problem Relation Age of Onset  . Depression  Mother   . Heart attack Father   . Diabetes Father        prediabetic  . Hypertension Father   . Hyperlipidemia Father   . Diabetes Paternal Grandmother   . Colon cancer Neg Hx   . Esophageal cancer Neg Hx   . Rectal cancer Neg Hx   . Stomach cancer Neg Hx     Social History:   Social History   Socioeconomic History  . Marital status: Single    Spouse name: Not on file  . Number of children: Not on file  . Years of education: Not on file  . Highest education  level: Not on file  Occupational History  . Not on file  Social Needs  . Financial resource strain: Not on file  . Food insecurity    Worry: Not on file    Inability: Not on file  . Transportation needs    Medical: Not on file    Non-medical: Not on file  Tobacco Use  . Smoking status: Never Smoker  . Smokeless tobacco: Never Used  Substance and Sexual Activity  . Alcohol use: Yes    Comment: Hard selzer once very 3 months. Cant tolerate alcoloh  . Drug use: No  . Sexual activity: Not Currently  Lifestyle  . Physical activity    Days per week: Not on file    Minutes per session: Not on file  . Stress: Not on file  Relationships  . Social Herbalist on phone: Not on file    Gets together: Not on file    Attends religious service: Not on file    Active member of club or organization: Not on file    Attends meetings of clubs or organizations: Not on file    Relationship status: Not on file  Other Topics Concern  . Not on file  Social History Narrative   LIVES WITH PARENTS.  STUDENT AT [employer]).        Allergies:   Allergies  Allergen Reactions  . Doxycycline     Nausea and vomiting   . Augmentin [Amoxicillin-Pot Clavulanate]     Vomiting when taken 10 years ago. Has taken amoxicillin without problems.    Metabolic Disorder Labs: No results found for: HGBA1C, MPG No results found for: PROLACTIN Lab Results  Component Value Date   CHOL 153 09/29/2013   TRIG 53 09/29/2013   HDL 47 09/29/2013   CHOLHDL 3.3 09/29/2013   VLDL 11 09/29/2013   LDLCALC 95 09/29/2013   Lab Results  Component Value Date   TSH 1.92 04/29/2018    Therapeutic Level Labs: No results found for: LITHIUM No results found for: CBMZ No results found for: VALPROATE  Current Medications: Current Outpatient Medications  Medication Sig Dispense Refill  . clobetasol (OLUX) 0.05 % topical foam Apply topically 2 (two) times daily. 50 g 5  . Dapsone 5 % topical gel APPLY  TOPICALLY TO THE AFFECTED AREA TWICE DAILY    . drospirenone-ethinyl estradiol (YAZ,GIANVI,LORYNA) 3-0.02 MG tablet Take 1 tablet by mouth daily. 30 tablet 11  . famotidine (PEPCID) 40 MG tablet TAKE 1 TABLET(40 MG) BY MOUTH DAILY 30 tablet 2  . hydrocortisone (ANUSOL-HC) 2.5 % rectal cream Place 1 application rectally daily. 30 g 5  . Multiple Vitamin (MULTIVITAMIN) capsule Take 1 capsule by mouth daily.    Marland Kitchen omeprazole (PRILOSEC) 40 MG capsule Take 1 capsule (40 mg total) by mouth daily. 90 capsule 3  . ondansetron (  ZOFRAN) 4 MG tablet Take 4 mg by mouth every 8 (eight) hours as needed for nausea or vomiting.    . Probiotic Product (PROBIOTIC DAILY PO) Take by mouth.    . promethazine (PHENERGAN) 25 MG tablet Take 25 mg by mouth every 6 (six) hours as needed for nausea or vomiting. Given only 12 pills    . spironolactone (ALDACTONE) 100 MG tablet Take 100 mg by mouth daily.    Marland Kitchen. venlafaxine XR (EFFEXOR XR) 37.5 MG 24 hr capsule Take 1 capsule (37.5 mg total) by mouth daily with breakfast. 30 capsule 0   No current facility-administered medications for this visit.       Psychiatric Specialty Exam: Review of Systems  Cardiovascular: Negative for chest pain.  Skin: Negative for rash.  Psychiatric/Behavioral: Negative for suicidal ideas. The patient is nervous/anxious and has insomnia.     There were no vitals taken for this visit.There is no height or weight on file to calculate BMI.  General Appearance:casual  Eye Contact:  fair  Speech: normal  Volume:decreased  Mood: subdued  Affect:  congruent  Thought Process:  linear  Orientation: full  Thought Content:no pyschosis  Suicidal Thoughts: denies  Homicidal Thoughts: denies  Memory: intact  Judgement:  fair  Insight:  fair  Psychomotor Activity:  decreased  Concentration: fair                  Cognition: intact  Sleep:  Fair but have vivid dreams   Screenings: GAD-7     [facility] [place] Office [facility] [place]  Total GAD-7 Score  15  16    PHQ2-9     [facility] [place] Office [facility] [place]  PHQ-2 Total Score  6  5  PHQ-9 Total Score  20  16      Assessment and Plan: as follows MDD moderate recurrent: wants to focus on anxiety and nightmares. Will re instate low dose klonopine Stop remeron. Not taking. Not suicidal May consider gene testing GAD: continue therapy for panic symptoms and relationship concern Work on distraction and not generalization into worries, has therpist In case symptoms are getting worse she can give us a call back also given because of her concern about sleep and GI side effects from the past Fu 4 -5 w or earlier, can call for klonopine refill. Also may consider prozac or other med after genet testing   I discussed the assessment and treatment plan with the patient. The patient was provided an opportunity to ask questions and all were answered. The patient agreed with the plan and demonstrated an understanding of the instructions.   The patient was advised to call back or seek an in-person evaluation if the symptoms worsen or if the condition fails to improve as anticipated.   Thresa RossNadeem Jovita Persing, MD 7/13/20203:48 PM

## 2018-10-05 ENCOUNTER — Telehealth: Payer: Self-pay | Admitting: Physician Assistant

## 2018-10-05 MED ORDER — FAMOTIDINE 20 MG PO TABS: 20.0000 mg | ORAL_TABLET | Freq: Two times a day (BID) | ORAL | 5 refills | Status: DC

## 2018-10-05 NOTE — Telephone Encounter (Signed)
40mg  on backorder. Sent 20mg  bid.

## 2018-10-11 ENCOUNTER — Telehealth (INDEPENDENT_AMBULATORY_CARE_PROVIDER_SITE_OTHER): Payer: Federal, State, Local not specified - PPO | Admitting: Physician Assistant

## 2018-10-11 VITALS — Ht 66.0 in

## 2018-10-11 DIAGNOSIS — K219 Gastro-esophageal reflux disease without esophagitis: Secondary | ICD-10-CM

## 2018-10-11 DIAGNOSIS — F411 Generalized anxiety disorder: Secondary | ICD-10-CM

## 2018-10-11 DIAGNOSIS — R1013 Epigastric pain: Secondary | ICD-10-CM

## 2018-10-11 DIAGNOSIS — F331 Major depressive disorder, recurrent, moderate: Secondary | ICD-10-CM

## 2018-10-11 DIAGNOSIS — R112 Nausea with vomiting, unspecified: Secondary | ICD-10-CM | POA: Diagnosis not present

## 2018-10-11 DIAGNOSIS — K5904 Chronic idiopathic constipation: Secondary | ICD-10-CM | POA: Diagnosis not present

## 2018-10-11 DIAGNOSIS — F515 Nightmare disorder: Secondary | ICD-10-CM

## 2018-10-11 DIAGNOSIS — F410 Panic disorder [episodic paroxysmal anxiety] without agoraphobia: Secondary | ICD-10-CM | POA: Diagnosis not present

## 2018-10-11 DIAGNOSIS — K640 First degree hemorrhoids: Secondary | ICD-10-CM

## 2018-10-11 DIAGNOSIS — F5101 Primary insomnia: Secondary | ICD-10-CM

## 2018-10-11 MED ORDER — PRAZOSIN HCL 1 MG PO CAPS: 1.0000 mg | ORAL_CAPSULE | Freq: Every day | ORAL | 1 refills | Status: DC

## 2018-10-11 MED ORDER — PROMETHAZINE HCL 25 MG PO TABS: 25.0000 mg | ORAL_TABLET | Freq: Four times a day (QID) | ORAL | 1 refills | Status: DC | PRN

## 2018-10-11 MED ORDER — PROMETHAZINE HCL 25 MG RE SUPP: 25.0000 mg | Freq: Four times a day (QID) | RECTAL | 1 refills | Status: DC | PRN

## 2018-10-11 MED ORDER — HYDROCORTISONE (PERIANAL) 2.5 % EX CREA: 1.0000 "application " | TOPICAL_CREAM | Freq: Two times a day (BID) | CUTANEOUS | 2 refills | Status: DC

## 2018-10-11 NOTE — Progress Notes (Signed)
Patient ID: Andrea Baker, female   DOB: 1991-03-01, 28 y.o.   MRN: [id number] .Marland KitchenVirtual Visit via Video Note  I connected with Andrea Baker on 10/13/18 at  4:00 PM EDT by a video enabled telemedicine application and verified that I am speaking with the correct person using two identifiers.  Location: Patient: home Provider: home   I discussed the limitations of evaluation and management by telemedicine and the availability of in person appointments. The patient expressed understanding and agreed to proceed.  History of Present Illness: Pt is a 28 yo female with GERD, MDD, GAD, panic attacks and intermittent vomiting and nausea who calls in for a follow up.   She is seeing GI for GERD. She has had lots of test with no answers. She is getting frustrated. She continues to have nausea and vomiting intermittently. She had a bad episode on 07/22/18 after drinking 2 alcoholic beverages and eating fried chicken. She had to go to ED for fluids.  She is very down and anxious. She sees a therapist regularly. She was not able to tolerate effexor due to constipation. She even tried linzess and did not work. She would rather have bowel movements than not be depressed. She continues to have really bad vivid nights that make her feel exhausted when she wakes up. No SI/Hc.   Marland Kitchen. Active Ambulatory Problems    Diagnosis Date Noted  . Gastroesophageal reflux disease 10/19/2009  . Anxiety and depression 05/02/2013  . Concentration deficit 05/02/2013  . Eczema 05/17/2013  . Chronic idiopathic constipation 06/12/2015  . 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 Seborrheic dermatitis of scalp 08/11/2016  . Grade I hemorrhoids 06/08/2017  . Panic attacks 05/02/2018  . Unexplained weight loss 05/02/2018  . Nightmares 05/02/2018  . Primary insomnia 06/06/2018  . Non-intractable vomiting   . GAD (generalized anxiety disorder) 10/13/2018  . Moderate episode of recurrent major depressive disorder ([place]) 10/13/2018  .  Dyspepsia 10/13/2018   Resolved Ambulatory Problems    Diagnosis Date Noted  . Hematuria 10/31/2014   Past Medical History:  Diagnosis Date  . Acne   . Constipation   . GERD 10/19/2009  . GERD (gastroesophageal reflux disease)   . Mouth ulcer   . Psoriasis   . Wears contact lenses   . Wears glasses    Reviewed med, allergy, problem list.   Observations/Objective: No acute distress. Normal mood.  Normal appearance.   No vitals.   .. Depression screen [facility] 2/9 10/11/2018 06/03/2018 04/29/2018  Decreased Interest 1 3 2   Down, Depressed, Hopeless 3 3 3   PHQ - 2 Score 4 6 5   Altered sleeping 3 3 3   Tired, decreased energy 3 3 3   Change in appetite 1 1 0  Feeling bad or failure about yourself  3 3 2   Trouble concentrating 3 1 1   Moving slowly or fidgety/restless 1 2 2   Suicidal thoughts 0 1 0  PHQ-9 Score 18 20 16   Difficult doing work/chores Very difficult Very difficult Very difficult   .Marland Kitchen GAD 7 : Generalized Anxiety Score 10/11/2018 06/03/2018 04/29/2018  Nervous, Anxious, on Edge 3 2 3   Control/stop worrying 3 2 3   Worry too much - different things 3 3 3   Trouble relaxing 3 2 2   Restless 3 0 0  Easily annoyed or irritable 1 3 2   Afraid - awful might happen 2 3 3   Total GAD 7 Score 18 15 16   Anxiety Difficulty Very difficult Very difficult Very difficult  Assessment and Plan: Marland Kitchen.Marland Kitchen.Andrea GreenWhitley was seen today for depression and gi problem.  Diagnoses and all orders for this visit:  Moderate episode of recurrent major depressive disorder (HCC)  Chronic idiopathic constipation  Non-intractable vomiting with nausea, unspecified vomiting type -     promethazine (PHENERGAN) 25 MG suppository; Place 1 suppository (25 mg total) rectally every 6 (six) hours as needed for nausea or vomiting. -     promethazine (PHENERGAN) 25 MG tablet; Take 1 tablet (25 mg total) by mouth every 6 (six) hours as needed for nausea or vomiting. Given only 12 pills  Panic attacks  Primary  insomnia  Gastroesophageal reflux disease without esophagitis  GAD (generalized anxiety disorder)  Gastroesophageal reflux disease, esophagitis presence not specified  Dyspepsia  Nightmares -     prazosin (MINIPRESS) 1 MG capsule; Take 1 capsule (1 mg total) by mouth at bedtime.  Grade I hemorrhoids -     hydrocortisone (ANUSOL-HC) 2.5 % rectal cream; Place 1 application rectally 2 (two) times daily.   Refilled needed medications.   Since she cannot take numerous medications and her MDD is so bad needs to consider TMS. Will make referral. Continue with therapist.   I would not take xanax every night to sleep. Her dreams are really bothering her. Start prazosin at bedtime.   Phenergan tablets and suppositories given. Ask GI about investigation of gallbladder with HIDA scan?   Follow Up Instructions:    I discussed the assessment and treatment plan with the patient. The patient was provided an opportunity to ask questions and all were answered. The patient agreed with the plan and demonstrated an understanding of the instructions.   The patient was advised to call back or seek an in-person evaluation if the symptoms worsen or if the condition fails to improve as anticipated.    Tandy GawJade Klair Leising, PA-C

## 2018-10-11 NOTE — Progress Notes (Signed)
Saw behavioral health who recommended taking Clonazepam every night, but doesn't want to do that. Wants your opinion. PHQ9-GAD7 completed.  Seeing GI next week. Needs refill anusol. Wants to discuss using phenergan > zofran.

## 2018-10-13 ENCOUNTER — Encounter: Payer: Self-pay | Admitting: Physician Assistant

## 2018-10-13 DIAGNOSIS — F411 Generalized anxiety disorder: Secondary | ICD-10-CM | POA: Insufficient documentation

## 2018-10-13 DIAGNOSIS — R1013 Epigastric pain: Secondary | ICD-10-CM | POA: Insufficient documentation

## 2018-10-13 DIAGNOSIS — F331 Major depressive disorder, recurrent, moderate: Secondary | ICD-10-CM | POA: Insufficient documentation

## 2018-10-19 ENCOUNTER — Other Ambulatory Visit: Payer: Self-pay

## 2018-10-19 ENCOUNTER — Telehealth (INDEPENDENT_AMBULATORY_CARE_PROVIDER_SITE_OTHER): Payer: Federal, State, Local not specified - PPO | Admitting: Gastroenterology

## 2018-10-19 ENCOUNTER — Encounter: Payer: Self-pay | Admitting: Gastroenterology

## 2018-10-19 VITALS — Ht 65.5 in | Wt 115.0 lb

## 2018-10-19 DIAGNOSIS — R112 Nausea with vomiting, unspecified: Secondary | ICD-10-CM

## 2018-10-19 DIAGNOSIS — K229 Disease of esophagus, unspecified: Secondary | ICD-10-CM | POA: Diagnosis not present

## 2018-10-19 DIAGNOSIS — F331 Major depressive disorder, recurrent, moderate: Secondary | ICD-10-CM

## 2018-10-19 DIAGNOSIS — K2289 Other specified disease of esophagus: Secondary | ICD-10-CM

## 2018-10-19 DIAGNOSIS — R1010 Upper abdominal pain, unspecified: Secondary | ICD-10-CM | POA: Diagnosis not present

## 2018-10-19 NOTE — Progress Notes (Signed)
Chief Complaint: Nausea, Vomiting, Esophageal Hypersensitivity, dyspepsia  GI History: 28 year old female initially seen by me in 04/2018 for evaluation of GERD.  Had been taking Zegrid since approx 2011, but changed to Omeprazole d/t insurance.  Index sxs of dyspepsia, nausea and visible abdominal distension. No HB, regurgitation, sour brash or belch. Only relief is with self-induced emesis.  Emesis contents typically bilious yellow/orange.  Emesis almost always self-induced because that will relieve symptoms the fastest.  Sxs typically middle of the night, waking from sleep, or postprandial.  Postprandial symptoms typically 1 hour after ingestion, but can be within minutes. Worse with tomato based foods, spicy, fatty/fried, EtOH.  No dysphagia or odynophagia.  No abdominal pain.  She has trialed multiple acid suppression agents, to include Protonix, Dexilant, Zegerid, Nexium, and now Prilosec.  She separately endorses a longstanding history of constipation since infancy.  This is unchanged from chronic symptoms and largely controlled with dietary modifications.  [place] in [place] and [place] in the past, with normal EGD and normal GES.   GI evaluation history: -[facility]01[facility] Normal -EGD (09/01/2013, [facility]): Duodenal nodule (lymphangiectasia with benign biopsy), otherwise normal duodenum with biopsies negative for Celiac Disease.  Otherwise normal - RUQ ultrasound (09/2015): Normal - GES (10/2015): Normal - EGD (05/2018, Dr. 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  Otherwise normal with normal peristalsis. -pH/impedance (08/2018): Normal.  Good symptom correlation with nausea and belch, but overall low number of events.  Consistent with hypersensitive esophagus.    HPI:    Due  to current restrictions/limitations of in-office visits due to the COVID-19 pandemic, this scheduled clinical appointment was converted to a telehealth virtual consultation using Doximity.  -Time of medical discussion: 22 minutes -The patient did consent to this virtual visit and is aware of possible charges through their insurance for this visit.  -Names of all parties present: Andrea Baker (patient), Doristine LocksVito Sobia Karger, DO, [facility] (physician) -Patient location: Office -Physician location: Office  Andrea Baker is a 28 y.o. female with a history of GAD, MDD, panic attacks, presenting to the Gastroenterology Clinic for routine follow-up.  Initially seen by me in 04/2018 with multiple UGI symptoms, to include nausea/vomiting, reflux, dyspepsia.  Evaluation has been unrevealing to date to include repeat EGD, esophageal manometry, pH/impedance testing.  pH/impedance notable for hypersensitive esophagus, with plan to discuss neuromodulation with TCA/SNRI/SSRI.  Today states she had flare of n/v on 5/1, went to the ER and given Rx of phenergan supp. Will continue to have intermittent n/v, with most recent occurrence in May after eating fried chicken and a small amount of EtOH (2 oz). Recurrent nausea and non-bloody emesis through the night. No improvement with PO Zofran. [facility] tx-ed with IVF and Phenergan.   [facility]epam q[facility]n. Has previously taken Wellbutrin, Effexor, Remeron, Paxil . Has been following regularly with her Psychiatrist since Jan 2020.   Effexor c/b constipation. Used Linzess during that time, but c/b abdominal cramping. Trialed Docusate prn with Effexor, but still with constipation so ultimately stopped.   Past medical history, past surgical history, social history, family history, medications, and allergies reviewed in the chart and with patient.    Past Medical History:  Diagnosis Date  . Acne   .  Anxiety  and depression 05/02/2013   Intolerant to lexapro and effexor   . Constipation   . GERD 10/19/2009   Failed omeprazole and protonix.   Marland Kitchen GERD (gastroesophageal reflux disease)   . Mouth ulcer   . Psoriasis    of the scalp  . Wears contact lenses   . Wears glasses      Past Surgical History:  Procedure Laterality Date  . ESOPHAGEAL MANOMETRY N/A 09/12/2018   Procedure: ESOPHAGEAL MANOMETRY (EM);  Surgeon: Mauri Pole, MD;  [facility];  Service: Endoscopy;  Laterality: N/A;  . ESOPHAGOGASTRODUODENOSCOPY     x2 have been normal and gastric emptying has been normal   . ESOPHAGOGASTRODUODENOSCOPY    . gastric empyting study     . Home IMPEDANCE STUDY N/A 09/12/2018   Procedure: [place] IMPEDANCE STUDY;  Surgeon: Mauri Pole, MD;  [facility]: [facility];  Service: Endoscopy;  Laterality: N/A;   Family History  Problem Relation Age of Onset  . Depression Mother   . Heart attack Father   . Diabetes Father        prediabetic  . Hypertension Father   . Hyperlipidemia Father   . Diabetes Paternal Grandmother   . Colon cancer Neg Hx   . Esophageal cancer Neg Hx   . Rectal cancer Neg Hx   . Stomach cancer Neg Hx    Social History   Tobacco Use  . Smoking status: Never Smoker  . Smokeless tobacco: Never Used  Substance Use Topics  . Alcohol use: Yes    Comment: Hard selzer once very 3 months. Cant tolerate alcoloh  . Drug use: No   Current Outpatient Medications  Medication Sig Dispense Refill  . clobetasol (OLUX) 0.05 % topical foam Apply topically 2 (two) times daily. 50 g 5  . clonazePAM (KLONOPIN) 0.5 MG tablet clonazepam 0.5 mg tablet    . drospirenone-ethinyl estradiol (YAZ,GIANVI,LORYNA) 3-0.02 MG tablet Take 1 tablet by mouth daily. 30 tablet 11  . famotidine (PEPCID) 20 MG tablet Take 1 tablet (20 mg total) by mouth 2 (two) times daily. 60 tablet 5  . hydrocortisone (ANUSOL-HC) 2.5 % rectal cream Place 1 application rectally 2 (two) times daily. 28 g  2  . metroNIDAZOLE (METROCREAM) 0.75 % cream APP EXT AA UNDER NOSE BID    . Multiple Vitamin (MULTIVITAMIN) capsule Take 1 capsule by mouth daily.    Marland Kitchen omeprazole (PRILOSEC) 40 MG capsule Take 1 capsule (40 mg total) by mouth daily. 90 capsule 3  . ondansetron (ZOFRAN) 4 MG tablet Take 4 mg by mouth every 8 (eight) hours as needed for nausea or vomiting.    . prazosin (MINIPRESS) 1 MG capsule Take 1 capsule (1 mg total) by mouth at bedtime. 30 capsule 1  . Probiotic Product (PROBIOTIC DAILY PO) Take by mouth.    . promethazine (PHENERGAN) 25 MG suppository Place 1 suppository (25 mg total) rectally every 6 (six) hours as needed for nausea or vomiting. 12 each 1  . promethazine (PHENERGAN) 25 MG tablet Take 1 tablet (25 mg total) by mouth every 6 (six) hours as needed for nausea or vomiting. Given only 12 pills 30 tablet 1  . spironolactone (ALDACTONE) 100 MG tablet Take 100 mg by mouth daily.     No current facility-administered medications for this visit.    Allergies  Allergen Reactions  . Doxycycline     Nausea and vomiting   . Augmentin [Amoxicillin-Pot Clavulanate]     Vomiting when taken 10  years ago. Has taken amoxicillin without problems.     Review of Systems: All systems reviewed and negative except where noted in HPI.     Physical Exam:    Complete physical exam not completed due to the nature of this telehealth communication.   Gen: Awake, alert, and oriented, and well communicative. HEENT: EOMI, non-icteric sclera, NCAT, MMM Neck: Normal movement of head and neck Pulm: No labored breathing, speaking in full sentences without conversational dyspnea Derm: No apparent lesions or bruising in visible field MS: Moves all visible extremities without noticeable abnormality Psych: Pleasant, cooperative, normal speech, thought processing seemingly intact   ASSESSMENT AND PLAN;   1) Esophageal Hypersensitivity 2) Functional dyspepsia 3) Nausea/vomiting  - Had a long  discussion re: starting either a TCA (amitriptyline) or SNRI (venlafaxine).  She has had issues tolerating venlafaxine in the past, and prefers to hold off on starting neuromodulation medication at this time. - Trial FD Gard or peppermint oil/peppermint Altoids -Can stop PPI, and if no appreciable breakthrough in symptoms, to stop H2 RA in another week or 2 -She'd like to proceed with a HIDA scan to rule out biliary dyskinesia  RTC in 3 to 4 months or sooner as needed   Shellia CleverlyVito V Carlee Tesfaye, DO, FACG  10/19/2018, 2:54 PM   Breeback, Jade L, PA-C

## 2018-10-19 NOTE — Patient Instructions (Addendum)
If you are age 28 or older, your body mass index should be between 23-30. Your Body mass index is 18.85 kg/m. If this is out of the aforementioned range listed, please consider follow up with your Primary Care Provider.  If you are age 28 or younger, your body mass index should be between 19-25. Your Body mass index is 18.85 kg/m. If this is out of the aformentioned range listed, please consider follow up with your Primary Care Provider.   To help prevent the possible spread of infection to our patients, communities, and staff; we will be implementing the following measures:  As of now we are not allowing any visitors/family members to accompany you to any upcoming appointments with [facility]. If you have any concerns about this please contact our office to discuss prior to the appointment.   Please purchase the following medications over the counter and take as directed: FD Delene RuffiniGard take as needed.  Stop taking your Prilosec then once tolerable stop taking Pepcid.  You have been scheduled for a HIDA scan at [facility] (1st floor) on 10/31/2018. Please arrive 15 minutes prior to your scheduled appointment at  3:47QQ8:00am. Make certain not to have anything to eat or drink at least 6 hours prior to your test. Should this appointment date or time not work well for you, please call radiology scheduling at [phone].  _____________________________________________________________________ hepatobiliary (HIDA) scan is an imaging procedure used to diagnose problems in the liver, gallbladder and bile ducts. In the HIDA scan, a radioactive chemical or tracer is injected into a vein in your arm. The tracer is handled by the liver like bile. Bile is a fluid produced and excreted by your liver that helps your digestive system break down fats in the foods you eat. Bile is stored in your gallbladder and the gallbladder releases the bile when you eat a meal. A special nuclear medicine scanner  (gamma camera) tracks the flow of the tracer from your liver into your gallbladder and small intestine.  During your HIDA scan  You'll be asked to change into a hospital gown before your HIDA scan begins. Your health care team will position you on a table, usually on your back. The radioactive tracer is then injected into a vein in your arm.The tracer travels through your bloodstream to your liver, where it's taken up by the bile-producing cells. The radioactive tracer travels with the bile from your liver into your gallbladder and through your bile ducts to your small intestine.You may feel some pressure while the radioactive tracer is injected into your vein. As you lie on the table, a special gamma camera is positioned over your abdomen taking pictures of the tracer as it moves through your body. The gamma camera takes pictures continually for about an hour. You'll need to keep still during the HIDA scan. This can become uncomfortable, but you may find that you can lessen the discomfort by taking deep breaths and thinking about other things. Tell your health care team if you're uncomfortable. The radiologist will watch on a computer the progress of the radioactive tracer through your body. The HIDA scan may be stopped when the radioactive tracer is seen in the gallbladder and enters your small intestine. This typically takes about an hour. In some cases extra imaging will be performed if original images aren't satisfactory, if morphine is given to help visualize the gallbladder or if the medication CCK is given to look at the contraction of the gallbladder. This test typically  takes 2 hours to complete. ________________________________________________________________________   Please call our office at [phone] to set up your 3 month follow up visit.  It was a pleasure to see you today!  Vito Cirigliano, D.O.

## 2018-10-24 DIAGNOSIS — Z681 Body mass index (BMI) 19 or less, adult: Secondary | ICD-10-CM | POA: Diagnosis not present

## 2018-10-24 DIAGNOSIS — L409 Psoriasis, unspecified: Secondary | ICD-10-CM | POA: Insufficient documentation

## 2018-10-24 DIAGNOSIS — Z01419 Encounter for gynecological examination (general) (routine) without abnormal findings: Secondary | ICD-10-CM | POA: Diagnosis not present

## 2018-10-31 ENCOUNTER — Encounter (HOSPITAL_COMMUNITY): Payer: Federal, State, Local not specified - PPO

## 2018-11-03 ENCOUNTER — Encounter: Payer: Self-pay | Admitting: Physician Assistant

## 2018-11-03 DIAGNOSIS — F329 Major depressive disorder, single episode, unspecified: Secondary | ICD-10-CM

## 2018-11-03 DIAGNOSIS — F331 Major depressive disorder, recurrent, moderate: Secondary | ICD-10-CM

## 2018-11-03 DIAGNOSIS — F419 Anxiety disorder, unspecified: Secondary | ICD-10-CM

## 2018-11-03 DIAGNOSIS — F32A Depression, unspecified: Secondary | ICD-10-CM

## 2018-11-15 ENCOUNTER — Telehealth: Payer: Self-pay | Admitting: Physician Assistant

## 2018-11-15 NOTE — Telephone Encounter (Signed)
Pt called to F/u on [place] referral and I left Lapria at [facility] office a message this morning to f/u on this referral and have not heard back yet. Lapria's number is [phone] I will call patient and let her know I left their office a message and hopefully they will reach out to patient. Thank you

## 2018-11-17 ENCOUNTER — Ambulatory Visit (HOSPITAL_COMMUNITY): Payer: Federal, State, Local not specified - PPO | Admitting: Psychiatry

## 2018-11-17 ENCOUNTER — Other Ambulatory Visit: Payer: Self-pay

## 2018-11-17 ENCOUNTER — Ambulatory Visit (HOSPITAL_COMMUNITY)
Admission: RE | Admit: 2018-11-17 | Discharge: 2018-11-17 | Disposition: A | Discharge status: Home / Self Care | Payer: Federal, State, Local not specified - PPO | Source: Ambulatory Visit | Attending: Gastroenterology | Admitting: Gastroenterology

## 2018-11-17 DIAGNOSIS — R112 Nausea with vomiting, unspecified: Secondary | ICD-10-CM | POA: Diagnosis not present

## 2018-11-17 DIAGNOSIS — R109 Unspecified abdominal pain: Secondary | ICD-10-CM | POA: Diagnosis not present

## 2018-11-17 MED ORDER — TECHNETIUM TC 99M MEBROFENIN IV KIT
5.0000 | PACK | Freq: Once | INTRAVENOUS | Status: AC | PRN
  Administered 2018-11-17: 5 via INTRAVENOUS

## 2018-11-17 NOTE — Telephone Encounter (Signed)
Patient has been already screened and scheduled for September 3rd at 1:00pm. It should be on your schedule.

## 2018-11-24 ENCOUNTER — Encounter (HOSPITAL_COMMUNITY): Payer: Self-pay | Admitting: Psychiatry

## 2018-11-24 ENCOUNTER — Ambulatory Visit (INDEPENDENT_AMBULATORY_CARE_PROVIDER_SITE_OTHER): Payer: Federal, State, Local not specified - PPO | Admitting: Psychiatry

## 2018-11-24 ENCOUNTER — Other Ambulatory Visit: Payer: Self-pay

## 2018-11-24 VITALS — BP 118/68 | HR 68 | Temp 97.9°F | Ht 65.5 in | Wt 111.0 lb

## 2018-11-24 DIAGNOSIS — F331 Major depressive disorder, recurrent, moderate: Secondary | ICD-10-CM | POA: Diagnosis not present

## 2018-11-24 DIAGNOSIS — F411 Generalized anxiety disorder: Secondary | ICD-10-CM

## 2018-11-24 NOTE — Progress Notes (Signed)
BH MD/PA/NP OP Progress Note  11/24/2018 1:39 PM Andrea Baker  MRN:  [id number]  Chief Complaint: I continue to struggle with my depression, I have a lot of side effects with the antidepressants, I do not seem to enjoy anything, I feel empty, sad and tired all the time HPI: Patient is a 28 year old female who presents today for TMS evaluation.  Patient reports that she has been struggling with anxiety and depression since 2012.  Patient reports that she first tried Lexapro in 2012 which made her depression worse, was then switched to Wellbutrin which helped with her depression and anxiety.  Patient has that she took Wellbutrin from 2012 04/2014.  Patient adds that in 2019, she started feeling depressed, having panic attacks.  She denies any precipitating factors and reports that she has a good job, is an outdoor person, has friends.  She reports that since her depression started in October 2019, it has progressively gotten worse, has not responded to any medications.  Patient states that she has been tried on Trintellix, Effexor, Paxil and she had side effects with all these medications with no benefit.  Patient adds that her depression has gotten progressively worse to the point where she feels empty, tired all the times, struggles with day-to-day activities, stays mostly to herself, feels overwhelmed, hopeless, helpless and worthless.  Patient does deny any suicidal thoughts, feels she needs to get out of her depression, wants to try TMS.  Patient denies any thoughts of self-harm or harm to others.  She also denies any psychotic symptoms  On a scale of 0-10, with 0 being no symptoms and 10 being the worst, patient reports that her depression stays an 8 out of 10.  She denies any relieving factors Visit Diagnosis:    ICD-10-CM   1. MDD (major depressive disorder), recurrent episode, moderate (HCC)  F33.1     Past Psychiatric History: Patient reports that she is seeing Dr. at [facility] at the outpatient in  [place], did not feel they made a connection, as that she is now seeing a therapist and continues to see her PCP at [facility] for med management.  She has no history of psychiatric hospitalization  Past Medical History:  Past Medical History:  Diagnosis Date  . Acne   . Anxiety and depression 05/02/2013   Intolerant to lexapro and effexor   . Constipation   . GERD 10/19/2009   Failed omeprazole and protonix.   Marland Kitchen GERD (gastroesophageal reflux disease)   . Mouth ulcer   . Psoriasis    of the scalp  . Wears contact lenses   . Wears glasses     Past Surgical History:  Procedure Laterality Date  . ESOPHAGEAL MANOMETRY N/A 09/12/2018   Procedure: ESOPHAGEAL MANOMETRY (EM);  Surgeon: Napoleon Form, MD;  Location: [facility];  Service: Endoscopy;  Laterality: N/A;  . ESOPHAGOGASTRODUODENOSCOPY     x2 have been normal and gastric emptying has been normal   . ESOPHAGOGASTRODUODENOSCOPY    . gastric empyting study     . PH IMPEDANCE STUDY N/A 09/12/2018   Procedure: PH IMPEDANCE STUDY;  Surgeon: Napoleon Form, MD;  Location: [facility];  Service: Endoscopy;  Laterality: N/A;    Family Psychiatric History: Patient reports that her mother suffers from depression and has taken Effexor to help with that  Family History:  Family History  Problem Relation Age of Onset  . Depression Mother   . Heart attack Father   . Diabetes Father  prediabetic  . Hypertension Father   . Hyperlipidemia Father   . Diabetes Paternal Grandmother   . Colon cancer Neg Hx   . Esophageal cancer Neg Hx   . Rectal cancer Neg Hx   . Stomach cancer Neg Hx     Social History:  Social History   Socioeconomic History  . Marital status: Single    Spouse name: Not on file  . Number of children: Not on file  . Years of education: Not on file  . Highest education level: Not on file  Occupational History  . Not on file  Social Needs  . Financial resource strain: Not on file  . Food  insecurity    Worry: Not on file    Inability: Not on file  . Transportation needs    Medical: Not on file    Non-medical: Not on file  Tobacco Use  . Smoking status: Never Smoker  . Smokeless tobacco: Never Used  Substance and Sexual Activity  . Alcohol use: Yes    Comment: Hard selzer once very 3 months. Cant tolerate alcoloh  . Drug use: No  . Sexual activity: Not Currently  Lifestyle  . Physical activity    Days per week: Not on file    Minutes per session: Not on file  . Stress: Not on file  Relationships  . Social Musicianconnections    Talks on phone: Not on file    Gets together: Not on file    Attends religious service: Not on file    Active member of club or organization: Not on file    Attends meetings of clubs or organizations: Not on file    Relationship status: Not on file  Other Topics Concern  . Not on file  Social History Narrative   LIVES WITH PARENTS.  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      Allergies:  Allergies  Allergen Reactions  . Doxycycline     Nausea and vomiting   . Augmentin [Amoxicillin-Pot Clavulanate]     Vomiting when taken 10 years ago. Has taken amoxicillin without problems.    Metabolic Disorder Labs: No results found for: HGBA1C, MPG No results found for: PROLACTIN Lab Results  Component Value Date   CHOL 153 09/29/2013   TRIG 53 09/29/2013   HDL 47 09/29/2013   CHOLHDL 3.3 09/29/2013   VLDL 11 09/29/2013   LDLCALC 95 09/29/2013   Lab Results  Component Value Date   TSH 1.92 04/29/2018    Therapeutic Level Labs: No results found for: LITHIUM No results found for: VALPROATE No components found for:  CBMZ  Current Medications: Current Outpatient Medications  Medication Sig Dispense Refill  . clobetasol (OLUX) 0.05 % topical foam Apply topically 2 (two) times daily. 50 g 5  . clonazePAM (KLONOPIN) 0.5 MG tablet clonazepam 0.5 mg tablet    . drospirenone-ethinyl estradiol (YAZ,GIANVI,LORYNA) 3-0.02 MG tablet Take 1 tablet by mouth  daily. 30 tablet 11  . famotidine (PEPCID) 20 MG tablet Take 1 tablet (20 mg total) by mouth 2 (two) times daily. 60 tablet 5  . hydrocortisone (ANUSOL-HC) 2.5 % rectal cream Place 1 application rectally 2 (two) times daily. 28 g 2  . metroNIDAZOLE (METROCREAM) 0.75 % cream APP EXT AA UNDER NOSE BID    . ondansetron (ZOFRAN) 4 MG tablet Take 4 mg by mouth every 8 (eight) hours as needed for nausea or vomiting.    . prazosin (MINIPRESS) 1 MG capsule Take 1 capsule (1 mg total) by mouth  at bedtime. 30 capsule 1  . Probiotic Product (PROBIOTIC DAILY PO) Take by mouth.    . promethazine (PHENERGAN) 25 MG suppository Place 1 suppository (25 mg total) rectally every 6 (six) hours as needed for nausea or vomiting. 12 each 1  . promethazine (PHENERGAN) 25 MG tablet Take 1 tablet (25 mg total) by mouth every 6 (six) hours as needed for nausea or vomiting. Given only 12 pills 30 tablet 1  . spironolactone (ALDACTONE) 100 MG tablet Take 100 mg by mouth daily.    . Multiple Vitamin (MULTIVITAMIN) capsule Take 1 capsule by mouth daily.     No current facility-administered medications for this visit.      Musculoskeletal: Strength & Muscle Tone: within normal limits Gait & Station: normal Patient leans: N/A  Psychiatric Specialty Exam: Review of Systems  Constitutional: Positive for malaise/fatigue and weight loss. Negative for fever.  HENT: Negative.  Negative for hearing loss, sinus pain and sore throat.   Eyes: Negative.  Negative for blurred vision, double vision, photophobia, discharge and redness.  Respiratory: Negative.  Negative for cough, shortness of breath and wheezing.   Cardiovascular: Negative.  Negative for chest pain and palpitations.  Gastrointestinal: Positive for constipation, heartburn, nausea and vomiting. Negative for abdominal pain and diarrhea.  Musculoskeletal: Negative.  Negative for falls, joint pain and myalgias.  Neurological: Negative.  Negative for dizziness, seizures,  loss of consciousness, weakness and headaches.  Endo/Heme/Allergies: Positive for environmental allergies.  Psychiatric/Behavioral: Positive for depression. Negative for hallucinations, substance abuse and suicidal ideas. The patient is nervous/anxious and has insomnia.     Blood pressure 118/68, pulse 68, temperature 97.9 F (36.6 C), height 5' 5.5" (1.664 m), weight 111 lb (50.3 kg).Body mass index is 18.19 kg/m.  General Appearance: Casual  Eye Contact:  Good  Speech:  Clear and Coherent and Normal Rate  Volume:  Normal  Mood:  Depressed, Dysphoric, Hopeless and Worthless  Affect:  Congruent, Depressed and Full Range  Thought Process:  Coherent, Goal Directed and Descriptions of Associations: Intact  Orientation:  Full (Time, Place, and Person)  Thought Content: Logical, Hallucinations: None and Rumination   Suicidal Thoughts:  No  Homicidal Thoughts:  No  Memory:  Immediate;   Fair Recent;   Fair Remote;   Fair  Judgement:  Intact  Insight:  Present  Psychomotor Activity:  Normal  Concentration:  Concentration: Fair and Attention Span: Fair  Recall:  [employer] of Knowledge: Fair  Language: Fair  Akathisia:  No  Handed:  Right  AIMS (if indicated): not done  Assets:  Desire for Improvement Financial Resources/Insurance Housing Leisure Time Talents/Skills Transportation  ADL's:  Intact  Cognition: WNL  Sleep:  Poor   Screenings: GAD-7     Video Visit from 10/11/2018 in [place] from 06/03/2018 in [place] Office Visit from 04/29/2018 in [place]  Total GAD-7 Score  18  15  16     PHQ2-9     Video Visit from 10/11/2018 in [place] Office Visit from 06/03/2018 in [place] Office Visit from 04/29/2018 in [place]  PHQ-2 Total Score  4   6  5   PHQ-9 Total Score  18  20  16        Assessment and Plan: Major depressive disorder, recurrent, severe Patient has tried multiple  medications as listed above, has had a lot of side effects with medications, has increased depression and unable to to manage day-to-day activities.  Patient is a candidate for TMS, discussed risks and benefits with patient and patient agreeable with this plan. Chronic idiopathic constipation, eczema, seborrheic dermatitis of scalp, psoriasis and allergic rhinitis To continue to see PCP for the above medical diagnoses Gastro esophageal reflux disorder, not intractable vomiting, dyspepsia, unexplained weight loss To to continue to follow up with her gastroenterologist for the above medical diagnoses  Call as needed, discussed with patient that we could do FMLA once approval for TMS happens to help patient be able to have her treatments on a regular basis.  Also discussed getting genetics, to help with medication management in the future for patient's depression and anxiety.  Patient currently is on clonazepam 0.5 to help with her anxiety daily and adds that she does not feel she would like to try an antidepressant at this time.  To continue prazosin 1 mg at bedtime to help with nightmares  This visit was a 40-minute visit as patient's history, medical diagnoses and treatment along with past psychiatric treatment, with mental status exam, review of systems, treatment options was discussed in length with the patient at this visit   Nelly RoutArchana Sourish Allender, MD 11/24/2018, 1:39 PM

## 2018-11-28 DIAGNOSIS — F331 Major depressive disorder, recurrent, moderate: Secondary | ICD-10-CM | POA: Diagnosis not present

## 2018-12-06 ENCOUNTER — Other Ambulatory Visit: Payer: Self-pay | Admitting: Physician Assistant

## 2018-12-06 DIAGNOSIS — F515 Nightmare disorder: Secondary | ICD-10-CM

## 2018-12-09 ENCOUNTER — Ambulatory Visit (INDEPENDENT_AMBULATORY_CARE_PROVIDER_SITE_OTHER): Payer: Federal, State, Local not specified - PPO | Admitting: Psychiatry

## 2018-12-09 ENCOUNTER — Other Ambulatory Visit: Payer: Self-pay

## 2018-12-09 DIAGNOSIS — F332 Major depressive disorder, recurrent severe without psychotic features: Secondary | ICD-10-CM | POA: Diagnosis not present

## 2018-12-09 NOTE — Progress Notes (Signed)
Pt reported to [facility] for cortical mapping and motor threshold determination for Repetitive Transcranial Magnetic Stimulation treatment for Major Depressive Disorder. Pt completed a PHQ-9 with a score of 24(severe depression). Pt also completed a Beck's Depression Inventory with a score of 33(severe depression). Prior to procedure, pt signed an informed consent agreement for [place] treatment. Pt's treatment area was found by applying single pulses to her left motor cortex, hunting along the anterior/posterior plane and along the superior oblique angle until the best motor response was elicited from the pt's right thumb. The best response was observed at 5.7 cmA/P and 30degrees SOA, with a coil angle of 0degrees. Pt's motor threshold was calculated using the [employer]'s proprietary MT Assist algorithm, which produced a calculated motor threshold of 0.71SMT. Per these findings, pt's treatment parameters are as follows: A/P -- 11.1cm, SOA -- 30degrees, Coil Angle -- 0degrees, Motor Threshold -- 0.71SMT. With these parameters, the pt will receive 36 sessions of TMS according to the following protocol: 3000 pulses per session, with stimulation in bursts of pulses lasting 4 seconds at a frequency of 10 Hz, separated by 26 seconds of rest. After determining pt's tx parameters, coil was moved to the treatment location, and the first burst of pulses was applied at a reduced power of 80%MT. Pt reported no complaints, and stated that the stimulation was tolerable. Upon completion of mapping, pt completed a few treatment intervals for observation of side effects. Pt tolerated tx well. Pt departed from clinic without issue.

## 2018-12-13 ENCOUNTER — Other Ambulatory Visit (HOSPITAL_COMMUNITY): Payer: Federal, State, Local not specified - PPO | Attending: Psychiatry | Admitting: Emergency Medicine

## 2018-12-13 ENCOUNTER — Other Ambulatory Visit: Payer: Self-pay

## 2018-12-13 DIAGNOSIS — F332 Major depressive disorder, recurrent severe without psychotic features: Secondary | ICD-10-CM | POA: Diagnosis not present

## 2018-12-13 NOTE — Progress Notes (Signed)
Patient reported to [facility] for Repetitive Transcranial Magnetic Stimulation treatment for severe episode of recurrent major depressive disorder, without psychotic features. Patient presented with appropriate affect, level mood and denied any suicidal or homicidal ideations. Patient denies any other current symptoms and remains optimistic with continued TMS treatment. Patient reported no change in alcohol/substance use, caffeine consumption, sleep pattern or metal implant status since previous tx. Patient reported to tx session with pleasant affect.Shespoke with writer.Powerwas titrated to120% for the duration of txand will remain there until patient gets adjusted. Patient reported no complaints or discomfort. Patientdeparted post-treatment with no concerns or complaints. 

## 2018-12-14 ENCOUNTER — Other Ambulatory Visit: Payer: Self-pay

## 2018-12-14 ENCOUNTER — Other Ambulatory Visit (INDEPENDENT_AMBULATORY_CARE_PROVIDER_SITE_OTHER): Payer: Federal, State, Local not specified - PPO | Admitting: Emergency Medicine

## 2018-12-14 DIAGNOSIS — F332 Major depressive disorder, recurrent severe without psychotic features: Secondary | ICD-10-CM

## 2018-12-14 NOTE — Progress Notes (Signed)
Patient reported to [facility] for Repetitive Transcranial Magnetic Stimulation treatment for severe episode of recurrent major depressive disorder, without psychotic features. Patient presented with appropriate affect, level mood and denied any suicidal or homicidal ideations. Patient denies any other current symptoms and remains optimistic with continued TMS treatment. Patient reported no change in alcohol/substance use, caffeine consumption, sleep pattern or metal implant status since previous tx. Patient reported to tx session with pleasant affect.Shespoke with writer.Powerwas titrated to120% for the duration of txand will remain there until patient gets adjusted. Patient reported no complaints or discomfort. Patientdeparted post-treatment with no concerns or complaints. 

## 2018-12-15 ENCOUNTER — Encounter (HOSPITAL_COMMUNITY): Payer: Federal, State, Local not specified - PPO

## 2018-12-16 ENCOUNTER — Other Ambulatory Visit (INDEPENDENT_AMBULATORY_CARE_PROVIDER_SITE_OTHER): Payer: Federal, State, Local not specified - PPO | Admitting: Emergency Medicine

## 2018-12-16 ENCOUNTER — Other Ambulatory Visit: Payer: Self-pay

## 2018-12-16 DIAGNOSIS — F332 Major depressive disorder, recurrent severe without psychotic features: Secondary | ICD-10-CM | POA: Diagnosis not present

## 2018-12-16 NOTE — Progress Notes (Signed)
Patient reported to [facility] for Repetitive Transcranial Magnetic Stimulation treatment for severe episode of recurrent major depressive disorder, without psychotic features. Patient presented with appropriate affect, level mood and denied any suicidal or homicidal ideations. Patient denies any other current symptoms and remains optimistic with continued TMS treatment. Patient reported no change in alcohol/substance use, caffeine consumption, sleep pattern or metal implant status since previous tx. Patient reported to tx session with pleasant affect.Shespoke with writer.She shared that she will be spending time with her family this weekend.Powerwas titrated to120% for the duration of txand will remain there until patient gets adjusted. Patient reported no complaints or discomfort. Patientdeparted post-treatment with no concerns or complaints. 

## 2018-12-19 ENCOUNTER — Other Ambulatory Visit: Payer: Self-pay

## 2018-12-19 ENCOUNTER — Other Ambulatory Visit (INDEPENDENT_AMBULATORY_CARE_PROVIDER_SITE_OTHER): Payer: Federal, State, Local not specified - PPO | Admitting: Emergency Medicine

## 2018-12-19 DIAGNOSIS — F332 Major depressive disorder, recurrent severe without psychotic features: Secondary | ICD-10-CM

## 2018-12-19 NOTE — Progress Notes (Signed)
Patient reported to [facility] for Repetitive Transcranial Magnetic Stimulation treatment for severe episode of recurrent major depressive disorder, without psychotic features. Patient presented with appropriate affect, level mood and denied any suicidal or homicidal ideations. Patient denies any other current symptoms and remains optimistic with continued TMS treatment. Patient reported no change in alcohol/substance use, caffeine consumption, sleep pattern or metal implant status since previous tx. Patient reported to tx session with pleasant affect.Shespoke with writer.She shared that she will be spending time with her family this weekend.Powerwas titrated to120% for the duration of txand will remain there until patient gets adjusted. Patient reported no complaints or discomfort. Patientdeparted post-treatment with no concerns or complaints. 

## 2018-12-20 ENCOUNTER — Other Ambulatory Visit: Payer: Self-pay

## 2018-12-20 ENCOUNTER — Other Ambulatory Visit (INDEPENDENT_AMBULATORY_CARE_PROVIDER_SITE_OTHER): Payer: Federal, State, Local not specified - PPO | Admitting: Emergency Medicine

## 2018-12-20 DIAGNOSIS — F332 Major depressive disorder, recurrent severe without psychotic features: Secondary | ICD-10-CM

## 2018-12-20 NOTE — Progress Notes (Signed)
Patient reported to [facility] for Repetitive Transcranial Magnetic Stimulation treatment for severe episode of recurrent major depressive disorder, without psychotic features. Patient presented with appropriate affect, level mood and denied any suicidal or homicidal ideations. Patient denies any other current symptoms and remains optimistic with continued TMS treatment. Patient reported no change in alcohol/substance use, caffeine consumption, sleep pattern or metal implant status since previous tx. Patient reported to tx session with pleasant affect.Shespoke with writer.She shared that she will be spending time with her family this weekend.Powerwas titrated to120% for the duration of txand will remain there until patient gets adjusted. Patient reported no complaints or discomfort. Patientdeparted post-treatment with no concerns or complaints. 

## 2018-12-21 ENCOUNTER — Other Ambulatory Visit (INDEPENDENT_AMBULATORY_CARE_PROVIDER_SITE_OTHER): Payer: Federal, State, Local not specified - PPO | Admitting: Emergency Medicine

## 2018-12-21 ENCOUNTER — Other Ambulatory Visit: Payer: Self-pay

## 2018-12-21 DIAGNOSIS — F332 Major depressive disorder, recurrent severe without psychotic features: Secondary | ICD-10-CM | POA: Diagnosis not present

## 2018-12-21 NOTE — Progress Notes (Signed)
Patient reported to [facility] for Repetitive Transcranial Magnetic Stimulation treatment for severe episode of recurrent major depressive disorder, without psychotic features. Patient presented with appropriate affect, level mood and denied any suicidal or homicidal ideations. Patient denies any other current symptoms and remains optimistic with continued TMS treatment. Patient reported no change in alcohol/substance use, caffeine consumption, sleep pattern or metal implant status since previous tx. Patient reported to tx session with pleasant affect.Shespoke with writer.She shared that she will be spending time with her family this weekend.Powerwas titrated to120% for the duration of txand will remain there until patient gets adjusted. Patient reported no complaints or discomfort. Patientdeparted post-treatment with no concerns or complaints. 

## 2018-12-22 ENCOUNTER — Other Ambulatory Visit (HOSPITAL_COMMUNITY): Payer: Federal, State, Local not specified - PPO | Attending: Psychiatry | Admitting: Emergency Medicine

## 2018-12-22 ENCOUNTER — Other Ambulatory Visit: Payer: Self-pay

## 2018-12-22 DIAGNOSIS — F332 Major depressive disorder, recurrent severe without psychotic features: Secondary | ICD-10-CM | POA: Diagnosis not present

## 2018-12-22 DIAGNOSIS — F331 Major depressive disorder, recurrent, moderate: Secondary | ICD-10-CM | POA: Diagnosis not present

## 2018-12-22 NOTE — Progress Notes (Signed)
Patient reported to [facility] for Repetitive Transcranial Magnetic Stimulation treatment for severe episode of recurrent major depressive disorder, without psychotic features. Patient presented with appropriate affect, level mood and denied any suicidal or homicidal ideations. Patient denies any other current symptoms and remains optimistic with continued [place] treatment. Patient reported no change in alcohol/substance use, caffeine consumption, sleep pattern or metal implant status since previous tx. Patient reported to tx session with pleasant affect.Shespoke with Probation officer.She shared that she will be spending time with her family this weekend.Powerwas titrated to120% for the duration of txand will remain there until patient gets adjusted. Patient reported no complaints or discomfort. Patientdeparted post-treatment with no concerns or complaints.

## 2018-12-26 ENCOUNTER — Other Ambulatory Visit: Payer: Self-pay

## 2018-12-26 ENCOUNTER — Other Ambulatory Visit (INDEPENDENT_AMBULATORY_CARE_PROVIDER_SITE_OTHER): Payer: Federal, State, Local not specified - PPO | Admitting: Emergency Medicine

## 2018-12-26 DIAGNOSIS — F332 Major depressive disorder, recurrent severe without psychotic features: Secondary | ICD-10-CM

## 2018-12-26 NOTE — Progress Notes (Signed)
Patient reported to [facility] for Repetitive Transcranial Magnetic Stimulation treatment for severe episode of recurrent major depressive disorder, without psychotic features. Patient presented with appropriate affect, level mood and denied any suicidal or homicidal ideations. Patient denies any other current symptoms and remains optimistic with continued TMS treatment. Patient reported no change in alcohol/substance use, caffeine consumption, sleep pattern or metal implant status since previous tx. Patient reported to tx session with pleasant affect.Shespoke with writer.She shared that she had a laid back weekend.Powerwas titrated to120% for the duration of txand will remain there until patient gets adjusted. Patient reported no complaints or discomfort. Patientdeparted post-treatment with no concerns or complaints. 

## 2018-12-27 ENCOUNTER — Other Ambulatory Visit (INDEPENDENT_AMBULATORY_CARE_PROVIDER_SITE_OTHER): Payer: Federal, State, Local not specified - PPO | Admitting: Emergency Medicine

## 2018-12-27 ENCOUNTER — Other Ambulatory Visit: Payer: Self-pay

## 2018-12-27 DIAGNOSIS — F332 Major depressive disorder, recurrent severe without psychotic features: Secondary | ICD-10-CM | POA: Diagnosis not present

## 2018-12-27 NOTE — Progress Notes (Signed)
Patient reported to [facility] for Repetitive Transcranial Magnetic Stimulation treatment for severe episode of recurrent major depressive disorder, without psychotic features. Patient presented with appropriate affect, level mood and denied any suicidal or homicidal ideations. Patient denies any other current symptoms and remains optimistic with continued TMS treatment. Patient reported no change in alcohol/substance use, caffeine consumption, sleep pattern or metal implant status since previous tx. Patient reported to tx session with pleasant affect.Shespoke with writer.She shared that she had a laid back weekend.Powerwas titrated to120% for the duration of txand will remain there until patient gets adjusted. Patient reported no complaints or discomfort. Patientdeparted post-treatment with no concerns or complaints. 

## 2018-12-28 ENCOUNTER — Other Ambulatory Visit: Payer: Self-pay

## 2018-12-28 ENCOUNTER — Other Ambulatory Visit (INDEPENDENT_AMBULATORY_CARE_PROVIDER_SITE_OTHER): Payer: Federal, State, Local not specified - PPO | Admitting: Emergency Medicine

## 2018-12-28 DIAGNOSIS — F332 Major depressive disorder, recurrent severe without psychotic features: Secondary | ICD-10-CM

## 2018-12-28 NOTE — Progress Notes (Signed)
Patient reported to [facility] for Repetitive Transcranial Magnetic Stimulation treatment for severe episode of recurrent major depressive disorder, without psychotic features. Patient presented with appropriate affect, level mood and denied any suicidal or homicidal ideations. Patient denies any other current symptoms and remains optimistic with continued TMS treatment. Patient reported no change in alcohol/substance use, caffeine consumption, sleep pattern or metal implant status since previous tx. Patient reported to tx session with pleasant affect.Shespoke with writer.She shared that she had a laid back weekend.Powerwas titrated to120% for the duration of txand will remain there until patient gets adjusted. Patient reported no complaints or discomfort. Patientdeparted post-treatment with no concerns or complaints. 

## 2018-12-29 ENCOUNTER — Other Ambulatory Visit (INDEPENDENT_AMBULATORY_CARE_PROVIDER_SITE_OTHER): Payer: Federal, State, Local not specified - PPO | Admitting: Emergency Medicine

## 2018-12-29 ENCOUNTER — Other Ambulatory Visit: Payer: Self-pay

## 2018-12-29 DIAGNOSIS — F332 Major depressive disorder, recurrent severe without psychotic features: Secondary | ICD-10-CM

## 2018-12-29 NOTE — Progress Notes (Signed)
Patient reported to [facility] for Repetitive Transcranial Magnetic Stimulation treatment for severe episode of recurrent major depressive disorder, without psychotic features. Patient presented with appropriate affect, level mood and denied any suicidal or homicidal ideations. Patient denies any other current symptoms and remains optimistic with continued [place] treatment. Patient reported no change in alcohol/substance use, caffeine consumption, sleep pattern or metal implant status since previous tx. Patient reported to tx session with pleasant affect.Shespoke with Probation officer.She shared that she had a laid back weekend.Powerwas titrated to120% for the duration of txand will remain there until patient gets adjusted. Patient reported no complaints or discomfort. Patientdeparted post-treatment with no concerns or complaints.

## 2018-12-30 ENCOUNTER — Encounter (HOSPITAL_COMMUNITY): Payer: Federal, State, Local not specified - PPO | Admitting: Emergency Medicine

## 2019-01-03 ENCOUNTER — Other Ambulatory Visit (HOSPITAL_COMMUNITY): Payer: Federal, State, Local not specified - PPO | Admitting: Emergency Medicine

## 2019-01-03 ENCOUNTER — Other Ambulatory Visit: Payer: Self-pay

## 2019-01-04 ENCOUNTER — Other Ambulatory Visit (INDEPENDENT_AMBULATORY_CARE_PROVIDER_SITE_OTHER): Payer: Federal, State, Local not specified - PPO | Admitting: Emergency Medicine

## 2019-01-04 ENCOUNTER — Other Ambulatory Visit: Payer: Self-pay

## 2019-01-04 DIAGNOSIS — F332 Major depressive disorder, recurrent severe without psychotic features: Secondary | ICD-10-CM

## 2019-01-04 NOTE — Progress Notes (Signed)
Patient reported to [facility] for Repetitive Transcranial Magnetic Stimulation treatment for severe episode of recurrent major depressive disorder, without psychotic features. Patient presented with appropriate affect, level mood and denied any suicidal or homicidal ideations. Patient denies any other current symptoms and remains optimistic with continued TMS treatment. Patient reported no change in alcohol/substance use, caffeine consumption, sleep pattern or metal implant status since previous tx. Patient reported to tx session with pleasant affect.Shespoke with writer.Powerwas titrated to120% for the duration of txand will remain there until patient gets adjusted. Patient reported no complaints or discomfort. Patientdeparted post-treatment with no concerns or complaints. 

## 2019-01-05 ENCOUNTER — Other Ambulatory Visit: Payer: Self-pay

## 2019-01-05 ENCOUNTER — Other Ambulatory Visit (INDEPENDENT_AMBULATORY_CARE_PROVIDER_SITE_OTHER): Payer: Federal, State, Local not specified - PPO | Admitting: Emergency Medicine

## 2019-01-05 DIAGNOSIS — F332 Major depressive disorder, recurrent severe without psychotic features: Secondary | ICD-10-CM

## 2019-01-05 DIAGNOSIS — F331 Major depressive disorder, recurrent, moderate: Secondary | ICD-10-CM | POA: Diagnosis not present

## 2019-01-05 NOTE — Progress Notes (Signed)
Patient reported to [facility] for Repetitive Transcranial Magnetic Stimulation treatment for severe episode of recurrent major depressive disorder, without psychotic features. Patient presented with appropriate affect, level mood and denied any suicidal or homicidal ideations. Patient denies any other current symptoms and remains optimistic with continued [place] treatment. Patient reported no change in alcohol/substance use, caffeine consumption, sleep pattern or metal implant status since previous tx. Patient reported to tx session with pleasant affect.Shespoke with Probation officer.Powerwas titrated to120% for the duration of txand will remain there until patient gets adjusted. Patient reported no complaints or discomfort. Patientdeparted post-treatment with no concerns or complaints.

## 2019-01-06 ENCOUNTER — Other Ambulatory Visit (INDEPENDENT_AMBULATORY_CARE_PROVIDER_SITE_OTHER): Payer: Federal, State, Local not specified - PPO | Admitting: Emergency Medicine

## 2019-01-06 ENCOUNTER — Other Ambulatory Visit: Payer: Self-pay

## 2019-01-06 DIAGNOSIS — F332 Major depressive disorder, recurrent severe without psychotic features: Secondary | ICD-10-CM | POA: Diagnosis not present

## 2019-01-06 NOTE — Progress Notes (Signed)
Patient reported to [facility] for Repetitive Transcranial Magnetic Stimulation treatment for severe episode of recurrent major depressive disorder, without psychotic features. Patient presented with appropriate affect, level mood and denied any suicidal or homicidal ideations. Patient denies any other current symptoms and remains optimistic with continued TMS treatment. Patient reported no change in alcohol/substance use, caffeine consumption, sleep pattern or metal implant status since previous tx. Patient reported to tx session with pleasant affect.Shespoke with writer.Powerwas titrated to120% for the duration of txand will remain there until patient gets adjusted. Patient reported no complaints or discomfort. Patientdeparted post-treatment with no concerns or complaints. 

## 2019-01-09 ENCOUNTER — Other Ambulatory Visit: Payer: Self-pay

## 2019-01-09 ENCOUNTER — Other Ambulatory Visit (INDEPENDENT_AMBULATORY_CARE_PROVIDER_SITE_OTHER): Payer: Federal, State, Local not specified - PPO | Admitting: Emergency Medicine

## 2019-01-09 DIAGNOSIS — F332 Major depressive disorder, recurrent severe without psychotic features: Secondary | ICD-10-CM

## 2019-01-09 NOTE — Progress Notes (Signed)
Patient reported to [facility] for Repetitive Transcranial Magnetic Stimulation treatment for severe episode of recurrent major depressive disorder, without psychotic features. Patient presented with appropriate affect, level mood and denied any suicidal or homicidal ideations. Patient denies any other current symptoms and remains optimistic with continued TMS treatment. Patient reported no change in alcohol/substance use, caffeine consumption, sleep pattern or metal implant status since previous tx. Patient reported to tx session with pleasant affect.Shespoke with writer and watched TV.Powerwas titrated to120% for the duration of txand will remain there until patient gets adjusted. Patient reported no complaints or discomfort. Patientdeparted post-treatment with no concerns or complaints. 

## 2019-01-10 ENCOUNTER — Other Ambulatory Visit: Payer: Self-pay | Admitting: Physician Assistant

## 2019-01-10 ENCOUNTER — Other Ambulatory Visit: Payer: Self-pay

## 2019-01-10 ENCOUNTER — Other Ambulatory Visit (INDEPENDENT_AMBULATORY_CARE_PROVIDER_SITE_OTHER): Payer: Federal, State, Local not specified - PPO | Admitting: Emergency Medicine

## 2019-01-10 DIAGNOSIS — F332 Major depressive disorder, recurrent severe without psychotic features: Secondary | ICD-10-CM

## 2019-01-10 DIAGNOSIS — F515 Nightmare disorder: Secondary | ICD-10-CM

## 2019-01-11 ENCOUNTER — Other Ambulatory Visit: Payer: Self-pay

## 2019-01-11 ENCOUNTER — Other Ambulatory Visit (HOSPITAL_COMMUNITY): Payer: Federal, State, Local not specified - PPO | Admitting: Emergency Medicine

## 2019-01-11 NOTE — Progress Notes (Signed)
[facility] treatment for severe episode of recurrent major depressive disorder, without psychotic features. Patient presented with appropriate affect, level mood and denied any suicidal or homicidal ideations. Patient denies any other current symptoms and remains optimistic with continued TMS treatment. Patient reported no change in alcohol/substance use, caffeine consumption, sleep pattern or metal implant status since previous tx. Patient reported to tx session with pleasant affect.Shespoke with writer and watched TV.Powerwas titrated to120% for the duration of txand will remain there until patient gets adjusted. Patient reported no complaints or discomfort. Patientdeparted post-treatment with no concerns or complaints. 

## 2019-01-12 ENCOUNTER — Other Ambulatory Visit (INDEPENDENT_AMBULATORY_CARE_PROVIDER_SITE_OTHER): Payer: Federal, State, Local not specified - PPO | Admitting: Emergency Medicine

## 2019-01-12 ENCOUNTER — Other Ambulatory Visit: Payer: Self-pay

## 2019-01-12 DIAGNOSIS — F332 Major depressive disorder, recurrent severe without psychotic features: Secondary | ICD-10-CM

## 2019-01-12 NOTE — Progress Notes (Signed)
Patient reported to [facility] for Repetitive Transcranial Magnetic Stimulation treatment for severe episode of recurrent major depressive disorder, without psychotic features. Patient presented with appropriate affect, level mood and denied any suicidal or homicidal ideations. Patient denies any other current symptoms and remains optimistic with continued TMS treatment. Patient reported no change in alcohol/substance use, caffeine consumption, sleep pattern or metal implant status since previous tx. Patient reported to tx session with pleasant affect.Shespoke with writer and watched TV.Powerwas titrated to120% for the duration of txand will remain there until patient gets adjusted. Patient reported no complaints or discomfort. Patientdeparted post-treatment with no concerns or complaints. 

## 2019-01-13 ENCOUNTER — Other Ambulatory Visit (INDEPENDENT_AMBULATORY_CARE_PROVIDER_SITE_OTHER): Payer: Federal, State, Local not specified - PPO | Admitting: Emergency Medicine

## 2019-01-13 ENCOUNTER — Other Ambulatory Visit: Payer: Self-pay

## 2019-01-13 DIAGNOSIS — F332 Major depressive disorder, recurrent severe without psychotic features: Secondary | ICD-10-CM | POA: Diagnosis not present

## 2019-01-13 NOTE — Progress Notes (Signed)
Patient reported to [facility] for Repetitive Transcranial Magnetic Stimulation treatment for severe episode of recurrent major depressive disorder, without psychotic features. Patient presented with appropriate affect, level mood and denied any suicidal or homicidal ideations. Patient denies any other current symptoms and remains optimistic with continued TMS treatment. Patient reported no change in alcohol/substance use, caffeine consumption, sleep pattern or metal implant status since previous tx. Patient reported to tx session with pleasant affect.Shespoke with writer and watched TV.Powerwas titrated to120% for the duration of txand will remain there until patient gets adjusted. Patient reported no complaints or discomfort. Patientdeparted post-treatment with no concerns or complaints. 

## 2019-01-16 ENCOUNTER — Other Ambulatory Visit: Payer: Self-pay

## 2019-01-16 ENCOUNTER — Other Ambulatory Visit (INDEPENDENT_AMBULATORY_CARE_PROVIDER_SITE_OTHER): Payer: Federal, State, Local not specified - PPO | Admitting: Emergency Medicine

## 2019-01-16 DIAGNOSIS — F332 Major depressive disorder, recurrent severe without psychotic features: Secondary | ICD-10-CM

## 2019-01-16 NOTE — Progress Notes (Signed)
Patient reported to [facility] for Repetitive Transcranial Magnetic Stimulation treatment for severe episode of recurrent major depressive disorder, without psychotic features. Patient presented with appropriate affect, level mood and denied any suicidal or homicidal ideations. Patient denies any other current symptoms and remains optimistic with continued TMS treatment. Patient reported no change in alcohol/substance use, caffeine consumption, sleep pattern or metal implant status since previous tx. Patient reported to tx session with pleasant affect.Shespoke with writer and watched TV.Powerwas titrated to120% for the duration of txand will remain there until patient gets adjusted. Patient reported no complaints or discomfort. Patientdeparted post-treatment with no concerns or complaints. 

## 2019-01-17 ENCOUNTER — Other Ambulatory Visit (HOSPITAL_COMMUNITY): Payer: Federal, State, Local not specified - PPO | Admitting: Emergency Medicine

## 2019-01-17 ENCOUNTER — Other Ambulatory Visit: Payer: Self-pay

## 2019-01-18 ENCOUNTER — Other Ambulatory Visit: Payer: Self-pay

## 2019-01-18 ENCOUNTER — Other Ambulatory Visit (INDEPENDENT_AMBULATORY_CARE_PROVIDER_SITE_OTHER): Payer: Federal, State, Local not specified - PPO | Admitting: Emergency Medicine

## 2019-01-18 DIAGNOSIS — F332 Major depressive disorder, recurrent severe without psychotic features: Secondary | ICD-10-CM

## 2019-01-18 NOTE — Progress Notes (Signed)
Patient reported to [facility] for Repetitive Transcranial Magnetic Stimulation treatment for severe episode of recurrent major depressive disorder, without psychotic features. Patient presented with appropriate affect, level mood and denied any suicidal or homicidal ideations. Patient denies any other current symptoms and remains optimistic with continued TMS treatment. Patient reported no change in alcohol/substance use, caffeine consumption, sleep pattern or metal implant status since previous tx. Patient reported to tx session with pleasant affect.Shespoke with writer and watched TV.Powerwas titrated to120% for the duration of txand will remain there until patient gets adjusted. Patient reported no complaints or discomfort. Patientdeparted post-treatment with no concerns or complaints. 

## 2019-01-19 ENCOUNTER — Other Ambulatory Visit: Payer: Self-pay

## 2019-01-19 ENCOUNTER — Other Ambulatory Visit (HOSPITAL_COMMUNITY): Payer: Federal, State, Local not specified - PPO | Admitting: Emergency Medicine

## 2019-01-20 ENCOUNTER — Encounter (HOSPITAL_COMMUNITY): Payer: Federal, State, Local not specified - PPO

## 2019-01-20 ENCOUNTER — Other Ambulatory Visit (INDEPENDENT_AMBULATORY_CARE_PROVIDER_SITE_OTHER): Payer: Federal, State, Local not specified - PPO | Admitting: Emergency Medicine

## 2019-01-20 ENCOUNTER — Other Ambulatory Visit: Payer: Self-pay

## 2019-01-20 DIAGNOSIS — F332 Major depressive disorder, recurrent severe without psychotic features: Secondary | ICD-10-CM | POA: Diagnosis not present

## 2019-01-20 NOTE — Progress Notes (Signed)
Patient reported to [facility] for Repetitive Transcranial Magnetic Stimulation treatment for severe episode of recurrent major depressive disorder, without psychotic features. Patient presented with appropriate affect, level mood and denied any suicidal or homicidal ideations. Patient denies any other current symptoms and remains optimistic with continued TMS treatment. Patient reported no change in alcohol/substance use, caffeine consumption, sleep pattern or metal implant status since previous tx. Patient reported to tx session with pleasant affect.Shespoke with writer and watched TV.Powerwas titrated to120% for the duration of txand will remain there until patient gets adjusted. Patient reported no complaints or discomfort. Patientdeparted post-treatment with no concerns or complaints. 

## 2019-01-23 ENCOUNTER — Other Ambulatory Visit: Payer: Self-pay

## 2019-01-23 ENCOUNTER — Other Ambulatory Visit (HOSPITAL_COMMUNITY): Payer: Federal, State, Local not specified - PPO | Attending: Psychiatry | Admitting: Emergency Medicine

## 2019-01-23 DIAGNOSIS — F332 Major depressive disorder, recurrent severe without psychotic features: Secondary | ICD-10-CM

## 2019-01-23 NOTE — Progress Notes (Signed)
Patient reported to [facility] for Repetitive Transcranial Magnetic Stimulation treatment for severe episode of recurrent major depressive disorder, without psychotic features. Patient presented with appropriate affect, level mood and denied any suicidal or homicidal ideations. Patient denies any other current symptoms and remains optimistic with continued TMS treatment. Patient reported no change in alcohol/substance use, caffeine consumption, sleep pattern or metal implant status since previous tx. Patient reported to tx session with pleasant affect.Shespoke with writer and watched TV.Powerwas titrated to120% for the duration of txand will remain there until patient gets adjusted. Patient reported no complaints or discomfort. Patientdeparted post-treatment with no concerns or complaints. 

## 2019-01-24 ENCOUNTER — Other Ambulatory Visit (INDEPENDENT_AMBULATORY_CARE_PROVIDER_SITE_OTHER): Payer: Federal, State, Local not specified - PPO | Admitting: Emergency Medicine

## 2019-01-24 ENCOUNTER — Other Ambulatory Visit: Payer: Self-pay

## 2019-01-24 DIAGNOSIS — F332 Major depressive disorder, recurrent severe without psychotic features: Secondary | ICD-10-CM

## 2019-01-24 NOTE — Progress Notes (Signed)
Patient reported to [facility] for Repetitive Transcranial Magnetic Stimulation treatment for severe episode of recurrent major depressive disorder, without psychotic features. Patient presented with appropriate affect, level mood and denied any suicidal or homicidal ideations. Patient denies any other current symptoms and remains optimistic with continued TMS treatment. Patient reported no change in alcohol/substance use, caffeine consumption, sleep pattern or metal implant status since previous tx. Patient reported to tx session with pleasant affect.Shespoke with writer and watched TV.Powerwas titrated to120% for the duration of txand will remain there until patient gets adjusted. Patient reported no complaints or discomfort. Patientdeparted post-treatment with no concerns or complaints. 

## 2019-01-25 ENCOUNTER — Other Ambulatory Visit (INDEPENDENT_AMBULATORY_CARE_PROVIDER_SITE_OTHER): Payer: Federal, State, Local not specified - PPO | Admitting: Emergency Medicine

## 2019-01-25 ENCOUNTER — Other Ambulatory Visit: Payer: Self-pay

## 2019-01-25 DIAGNOSIS — F332 Major depressive disorder, recurrent severe without psychotic features: Secondary | ICD-10-CM

## 2019-01-25 NOTE — Progress Notes (Signed)
Patient reported to [facility] for Repetitive Transcranial Magnetic Stimulation treatment for severe episode of recurrent major depressive disorder, without psychotic features. Patient presented with appropriate affect, level mood and denied any suicidal or homicidal ideations. Patient denies any other current symptoms and remains optimistic with continued TMS treatment. Patient reported no change in alcohol/substance use, caffeine consumption, sleep pattern or metal implant status since previous tx. Patient reported to tx session with pleasant affect.Shespoke with writer and watched TV.Powerwas titrated to120% for the duration of txand will remain there until patient gets adjusted. Patient reported no complaints or discomfort. Patientdeparted post-treatment with no concerns or complaints. 

## 2019-01-26 ENCOUNTER — Other Ambulatory Visit (INDEPENDENT_AMBULATORY_CARE_PROVIDER_SITE_OTHER): Payer: Federal, State, Local not specified - PPO | Admitting: Emergency Medicine

## 2019-01-26 ENCOUNTER — Other Ambulatory Visit: Payer: Self-pay

## 2019-01-26 DIAGNOSIS — F332 Major depressive disorder, recurrent severe without psychotic features: Secondary | ICD-10-CM

## 2019-01-26 NOTE — Progress Notes (Signed)
Patient reported to [facility] for Repetitive Transcranial Magnetic Stimulation treatment for severe episode of recurrent major depressive disorder, without psychotic features. Patient presented with appropriate affect, level mood and denied any suicidal or homicidal ideations. Patient denies any other current symptoms and remains optimistic with continued TMS treatment. Patient reported no change in alcohol/substance use, caffeine consumption, sleep pattern or metal implant status since previous tx. Patient reported to tx session with pleasant affect.Shespoke with writer and watched TV.Powerwas titrated to120% for the duration of txand will remain there until patient gets adjusted. Patient reported no complaints or discomfort. Patientdeparted post-treatment with no concerns or complaints. 

## 2019-01-27 ENCOUNTER — Other Ambulatory Visit (INDEPENDENT_AMBULATORY_CARE_PROVIDER_SITE_OTHER): Payer: Federal, State, Local not specified - PPO | Admitting: Emergency Medicine

## 2019-01-27 ENCOUNTER — Other Ambulatory Visit: Payer: Self-pay

## 2019-01-27 DIAGNOSIS — F332 Major depressive disorder, recurrent severe without psychotic features: Secondary | ICD-10-CM

## 2019-01-27 NOTE — Progress Notes (Signed)
Patient reported to [facility] for Repetitive Transcranial Magnetic Stimulation treatment for severe episode of recurrent major depressive disorder, without psychotic features. Patient presented with appropriate affect, level mood and denied any suicidal or homicidal ideations. Patient denies any other current symptoms and remains optimistic with continued [place] treatment. Patient reported no change in alcohol/substance use, caffeine consumption, sleep pattern or metal implant status since previous tx. Patient reported to tx session with pleasant affect.Shespoke with Probation officer and watched TV.Powerwas titrated to120% for the duration of txand will remain there until patient gets adjusted. Patient reported no complaints or discomfort. Patientdeparted post-treatment with no concerns or complaints.

## 2019-01-30 ENCOUNTER — Other Ambulatory Visit (INDEPENDENT_AMBULATORY_CARE_PROVIDER_SITE_OTHER): Payer: Federal, State, Local not specified - PPO | Admitting: Emergency Medicine

## 2019-01-30 ENCOUNTER — Other Ambulatory Visit: Payer: Self-pay

## 2019-01-30 DIAGNOSIS — F332 Major depressive disorder, recurrent severe without psychotic features: Secondary | ICD-10-CM | POA: Diagnosis not present

## 2019-01-30 NOTE — Progress Notes (Signed)
Patient reported to [facility] for Repetitive Transcranial Magnetic Stimulation treatment for severe episode of recurrent major depressive disorder, without psychotic features. Patient presented with appropriate affect, level mood and denied any suicidal or homicidal ideations. Patient denies any other current symptoms and remains optimistic with continued TMS treatment. Patient reported no change in alcohol/substance use, caffeine consumption, sleep pattern or metal implant status since previous tx. Patient reported to tx session with pleasant affect.Shespoke with writer and watched TV.Powerwas titrated to120% for the duration of txand will remain there until patient gets adjusted. Patient reported no complaints or discomfort. Patientdeparted post-treatment with no concerns or complaints. 

## 2019-01-31 ENCOUNTER — Other Ambulatory Visit: Payer: Self-pay

## 2019-01-31 ENCOUNTER — Other Ambulatory Visit (INDEPENDENT_AMBULATORY_CARE_PROVIDER_SITE_OTHER): Payer: Federal, State, Local not specified - PPO | Admitting: Emergency Medicine

## 2019-01-31 DIAGNOSIS — F332 Major depressive disorder, recurrent severe without psychotic features: Secondary | ICD-10-CM

## 2019-01-31 NOTE — Progress Notes (Signed)
Patient reported to [facility] for Repetitive Transcranial Magnetic Stimulation treatment for severe episode of recurrent major depressive disorder, without psychotic features. Patient presented with appropriate affect, level mood and denied any suicidal or homicidal ideations. Patient denies any other current symptoms and remains optimistic with continued TMS treatment. Patient reported no change in alcohol/substance use, caffeine consumption, sleep pattern or metal implant status since previous tx. Patient reported to tx session with pleasant affect.Shespoke with writer and watched TV.Powerwas titrated to120% for the duration of txand will remain there until patient gets adjusted. Patient reported no complaints or discomfort. Patientdeparted post-treatment with no concerns or complaints. 

## 2019-02-01 ENCOUNTER — Other Ambulatory Visit (INDEPENDENT_AMBULATORY_CARE_PROVIDER_SITE_OTHER): Payer: Federal, State, Local not specified - PPO | Admitting: Emergency Medicine

## 2019-02-01 ENCOUNTER — Other Ambulatory Visit: Payer: Self-pay

## 2019-02-01 DIAGNOSIS — F332 Major depressive disorder, recurrent severe without psychotic features: Secondary | ICD-10-CM

## 2019-02-01 NOTE — Progress Notes (Signed)
Patient reported to [facility] for Repetitive Transcranial Magnetic Stimulation treatment for severe episode of recurrent major depressive disorder, without psychotic features. Patient presented with appropriate affect, level mood and denied any suicidal or homicidal ideations. Patient denies any other current symptoms and remains optimistic with continued TMS treatment. Patient reported no change in alcohol/substance use, caffeine consumption, sleep pattern or metal implant status since previous tx. Patient reported to tx session with pleasant affect.Shespoke with writer and watched TV.Powerwas titrated to120% for the duration of txand will remain there until patient gets adjusted. Patient reported no complaints or discomfort. Patientdeparted post-treatment with no concerns or complaints. 

## 2019-02-02 ENCOUNTER — Other Ambulatory Visit: Payer: Self-pay

## 2019-02-02 ENCOUNTER — Other Ambulatory Visit (HOSPITAL_COMMUNITY): Payer: Federal, State, Local not specified - PPO | Admitting: Emergency Medicine

## 2019-02-06 ENCOUNTER — Encounter: Payer: Self-pay | Admitting: Physician Assistant

## 2019-02-06 ENCOUNTER — Other Ambulatory Visit (INDEPENDENT_AMBULATORY_CARE_PROVIDER_SITE_OTHER): Payer: Federal, State, Local not specified - PPO | Admitting: Emergency Medicine

## 2019-02-06 ENCOUNTER — Other Ambulatory Visit: Payer: Self-pay

## 2019-02-06 ENCOUNTER — Other Ambulatory Visit: Payer: Self-pay | Admitting: Physician Assistant

## 2019-02-06 DIAGNOSIS — F332 Major depressive disorder, recurrent severe without psychotic features: Secondary | ICD-10-CM

## 2019-02-06 DIAGNOSIS — F331 Major depressive disorder, recurrent, moderate: Secondary | ICD-10-CM | POA: Diagnosis not present

## 2019-02-06 DIAGNOSIS — F515 Nightmare disorder: Secondary | ICD-10-CM

## 2019-02-06 NOTE — Progress Notes (Signed)
Patient reported to [facility] for Repetitive Transcranial Magnetic Stimulation treatment for severe episode of recurrent major depressive disorder, without psychotic features. Patient presented with appropriate affect, level mood and denied any suicidal or homicidal ideations. Patient denies any other current symptoms and remains optimistic with continued TMS treatment. Patient reported no change in alcohol/substance use, caffeine consumption, sleep pattern or metal implant status since previous tx. Patient reported to tx session with pleasant affect.Shespoke with writer and watched TV.Powerwas titrated to120% for the duration of txand will remain there until patient gets adjusted. Patient reported no complaints or discomfort. Patientdeparted post-treatment with no concerns or complaints. 

## 2019-02-07 ENCOUNTER — Other Ambulatory Visit (INDEPENDENT_AMBULATORY_CARE_PROVIDER_SITE_OTHER): Payer: Federal, State, Local not specified - PPO | Admitting: Emergency Medicine

## 2019-02-07 ENCOUNTER — Other Ambulatory Visit: Payer: Self-pay

## 2019-02-07 DIAGNOSIS — F332 Major depressive disorder, recurrent severe without psychotic features: Secondary | ICD-10-CM

## 2019-02-07 NOTE — Telephone Encounter (Signed)
Received call from pt regarding discontinuing Minipress. Pt states she's been off of it for several nights as feels [place] is working, and has no noticeable side effects. So just questioning if she needs to titrate medication. Please review and advise.

## 2019-02-07 NOTE — Telephone Encounter (Signed)
She does not need to titrate. She can just stop she was on the lowest dose.

## 2019-02-07 NOTE — Progress Notes (Signed)
Patient reported to [facility] for Repetitive Transcranial Magnetic Stimulation treatment for severe episode of recurrent major depressive disorder, without psychotic features. Patient presented with appropriate affect, level mood and denied any suicidal or homicidal ideations. Patient denies any other current symptoms and remains optimistic with continued TMS treatment. Patient reported no change in alcohol/substance use, caffeine consumption, sleep pattern or metal implant status since previous tx. Patient reported to tx session with pleasant affect.Shespoke with writer and watched TV.Powerwas titrated to120% for the duration of txand will remain there until patient gets adjusted. Patient reported no complaints or discomfort. Patientdeparted post-treatment with no concerns or complaints. 

## 2019-02-08 ENCOUNTER — Other Ambulatory Visit: Payer: Self-pay

## 2019-02-08 ENCOUNTER — Other Ambulatory Visit (INDEPENDENT_AMBULATORY_CARE_PROVIDER_SITE_OTHER): Payer: Federal, State, Local not specified - PPO | Admitting: Emergency Medicine

## 2019-02-08 DIAGNOSIS — F332 Major depressive disorder, recurrent severe without psychotic features: Secondary | ICD-10-CM | POA: Diagnosis not present

## 2019-02-08 NOTE — Progress Notes (Signed)
Patient reported to [facility] for Repetitive Transcranial Magnetic Stimulation treatment for severe episode of recurrent major depressive disorder, without psychotic features. Patient presented with appropriate affect, level mood and denied any suicidal or homicidal ideations. Patient denies any other current symptoms and remains optimistic with continued TMS treatment. Patient reported no change in alcohol/substance use, caffeine consumption, sleep pattern or metal implant status since previous tx. Patient reported to tx session with pleasant affect.Shespoke with writer and watched TV.Powerwas titrated to120% for the duration of txand will remain there until patient gets adjusted. Patient reported no complaints or discomfort. Patientdeparted post-treatment with no concerns or complaints. 

## 2019-02-08 NOTE — Telephone Encounter (Signed)
Thank you :)

## 2019-02-14 ENCOUNTER — Other Ambulatory Visit (INDEPENDENT_AMBULATORY_CARE_PROVIDER_SITE_OTHER): Payer: Federal, State, Local not specified - PPO | Admitting: Emergency Medicine

## 2019-02-14 ENCOUNTER — Other Ambulatory Visit: Payer: Self-pay

## 2019-02-14 DIAGNOSIS — F332 Major depressive disorder, recurrent severe without psychotic features: Secondary | ICD-10-CM | POA: Diagnosis not present

## 2019-02-14 NOTE — Progress Notes (Signed)
Patient reported to [facility] for Repetitive Transcranial Magnetic Stimulation treatment for severe episode of recurrent major depressive disorder, without psychotic features. Patient presented with appropriate affect, level mood and denied any suicidal or homicidal ideations. Patient denies any other current symptoms and remains optimistic with continued [place] treatment. Patient reported no change in alcohol/substance use, caffeine consumption, sleep pattern or metal implant status since previous tx. Patient reported to tx session with pleasant affect.Shespoke with Probation officer and watched TV. Pt shared that [place] has not significantly improved her quality of life. She is unsure if the medication change impacted the results. Powerwas titrated to120% for the duration of txand will remain there until patient gets adjusted. Patient reported no complaints or discomfort. Patientdeparted post-treatment with no concerns or complaints.

## 2019-02-15 ENCOUNTER — Telehealth (INDEPENDENT_AMBULATORY_CARE_PROVIDER_SITE_OTHER): Payer: Federal, State, Local not specified - PPO | Admitting: Physician Assistant

## 2019-02-15 VITALS — Ht 66.0 in | Wt 113.0 lb

## 2019-02-15 DIAGNOSIS — F410 Panic disorder [episodic paroxysmal anxiety] without agoraphobia: Secondary | ICD-10-CM | POA: Diagnosis not present

## 2019-02-15 DIAGNOSIS — F515 Nightmare disorder: Secondary | ICD-10-CM

## 2019-02-15 DIAGNOSIS — F411 Generalized anxiety disorder: Secondary | ICD-10-CM | POA: Diagnosis not present

## 2019-02-15 DIAGNOSIS — F331 Major depressive disorder, recurrent, moderate: Secondary | ICD-10-CM

## 2019-02-15 DIAGNOSIS — F5101 Primary insomnia: Secondary | ICD-10-CM

## 2019-02-15 MED ORDER — PRAZOSIN HCL 2 MG PO CAPS: ORAL_CAPSULE | ORAL | 1 refills | Status: DC

## 2019-02-15 NOTE — Progress Notes (Signed)
Depression/anxiety worse. PHQ9-GAD7 completed.

## 2019-02-15 NOTE — Progress Notes (Signed)
Patient ID: Andrea Baker, female   DOB: [birth year], 28 y.o.   MRN: [id number] .Marland KitchenVirtual Visit via Video Note  I connected with Joselyn Edling on 02/20/19 at  7:10 AM EST by a video enabled telemedicine application and verified that I am speaking with the correct person using two identifiers.  Location: Patient: car Provider: home   I discussed the limitations of evaluation and management by telemedicine and the availability of in person appointments. The patient expressed understanding and agreed to proceed.  History of Present Illness: Pt is a 28 yo female with MDD, GAD, panic attacks, and nightmares who presents to the clinic for follow up on medication.   Patient is currently seeing Dr. Dwyane Dee in behavioral health.  She just finished [place].  She is unsure if [place] had any benefit.  She does notice her nausea has improved significantly.  She is also using natural treatments such as peppermint oil.  Patient had some GeneSight testing done but she has not heard back results.  She continues to be very depressed, anxious.  She is not having as many panic attacks.  She thought the prazosin was not helping with her nightmares but when she stopped it she realized it was helping.  She feels like when she is on medication the nightmares do not have as much effect on her. No suicidal or homicidal thoughts.  .. Active Ambulatory Problems    Diagnosis Date Noted  . Gastroesophageal reflux disease 10/19/2009  . Anxiety and depression 05/02/2013  . Concentration deficit 05/02/2013  . Eczema 05/17/2013  . Chronic idiopathic constipation 06/12/2015  . 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 Seborrheic dermatitis of scalp 08/11/2016  . Grade I hemorrhoids 06/08/2017  . Panic attacks 05/02/2018  . Unexplained weight loss 05/02/2018  . Nightmares 05/02/2018  . Primary insomnia 06/06/2018  . Non-intractable vomiting   . GAD (generalized anxiety disorder) 10/13/2018  . Moderate episode of recurrent major depressive  disorder ([place]) 10/13/2018  . Dyspepsia 10/13/2018  . Allergic rhinitis 08/07/2011  . Psoriasis 10/24/2018   Resolved Ambulatory Problems    Diagnosis Date Noted  . Hematuria 10/31/2014   Past Medical History:  Diagnosis Date  . Acne   . Constipation   . GERD 10/19/2009  . GERD (gastroesophageal reflux disease)   . Mouth ulcer   . Wears contact lenses   . Wears glasses    Reviewed med, allergy, problem list.  Observations/Objective: No acute distress. Normal mood and appearance.  No labored breathing.   No vital signs.   .. Depression screen [facility] 2/9 02/15/2019 10/11/2018 06/03/2018 04/29/2018  Decreased Interest 2 1 3 2   Down, Depressed, Hopeless 3 3 3 3   PHQ - 2 Score 5 4 6 5   Altered sleeping 3 3 3 3   Tired, decreased energy 3 3 3 3   Change in appetite 1 1 1  0  Feeling bad or failure about yourself  3 3 3 2   Trouble concentrating 3 3 1 1   Moving slowly or fidgety/restless 3 1 2 2   Suicidal thoughts 0 0 1 0  PHQ-9 Score 21 18 20 16   Difficult doing work/chores Very difficult Very difficult Very difficult Very difficult  Some encounter information is confidential and restricted. Go to Review Flowsheets activity to see all data.   .. GAD 7 : Generalized Anxiety Score 02/15/2019 10/11/2018 06/03/2018 04/29/2018  Nervous, Anxious, on Edge 3 3 2 3   Control/stop worrying 3 3 2 3   Worry too much - different things 3 3 3  3  Trouble relaxing 3 3 2 2   Restless 1 3 0 0  Easily annoyed or irritable 3 1 3 2   Afraid - awful might happen 3 2 3 3   Total GAD 7 Score 19 18 15 16   Anxiety Difficulty Somewhat difficult Very difficult Very difficult Very difficult       Assessment and Plan:  Kemia was seen today for depression.  Diagnoses and all orders for this visit:  Moderate episode of recurrent major depressive disorder (HCC)  Nightmares -     prazosin (MINIPRESS) 2 MG capsule; Take one to three times before bed for nightmares.  GAD (generalized anxiety  disorder)  Panic attacks  Primary insomnia   Continue to follow up with [facility]. Increased prazosin.  Perhaps increase will further help her nightmares and her restful sleep.  Will reach out to Dr. Marland Kitchen to see if they can get back to you with gene sight results.   Follow Up Instructions:    I discussed the assessment and treatment plan with the patient. The patient was provided an opportunity to ask questions and all were answered. The patient agreed with the plan and demonstrated an understanding of the instructions.   The patient was advised to call back or seek an in-person evaluation if the symptoms worsen or if the condition fails to improve as anticipated.   Marland Kitchen, PA-C

## 2019-02-20 ENCOUNTER — Telehealth: Payer: Self-pay | Admitting: Physician Assistant

## 2019-02-20 ENCOUNTER — Encounter: Payer: Self-pay | Admitting: Physician Assistant

## 2019-02-20 NOTE — Telephone Encounter (Signed)
Pt is inquiring about her gene sight testing results from [facility], Dr. Dwyane Dee. Can we reach out to them for her?

## 2019-02-20 NOTE — Telephone Encounter (Signed)
Left message on machine for Behavioral Health nursing to call back with an update on time line for results.

## 2019-02-21 NOTE — Telephone Encounter (Signed)
Behavioral Health called back, they will call patient with results and are faxing a copy to [place].

## 2019-02-22 ENCOUNTER — Telehealth: Payer: Self-pay | Admitting: Physician Assistant

## 2019-02-22 DIAGNOSIS — F331 Major depressive disorder, recurrent, moderate: Secondary | ICD-10-CM | POA: Diagnosis not present

## 2019-02-22 NOTE — Telephone Encounter (Signed)
mychart msg.  

## 2019-02-28 NOTE — Telephone Encounter (Signed)
Reminder sent to look for results in scan.

## 2019-02-28 NOTE — Telephone Encounter (Signed)
I sent gene sight testing to scan. Can we make a note to keep checking so we can print for patient. Or get patient a copy.

## 2019-03-09 DIAGNOSIS — F331 Major depressive disorder, recurrent, moderate: Secondary | ICD-10-CM | POA: Diagnosis not present

## 2019-03-10 MED ORDER — DESVENLAFAXINE SUCCINATE ER 50 MG PO TB24: 50.0000 mg | ORAL_TABLET | Freq: Every day | ORAL | 1 refills | Status: DC

## 2019-03-10 NOTE — Telephone Encounter (Signed)
Sent pristiq. I am still looking for scanned copy of report. Can you help?

## 2019-03-10 NOTE — Telephone Encounter (Signed)
Patient called again to ask about starting Pristiq. Please advise.

## 2019-03-10 NOTE — Addendum Note (Signed)
Addended by: Donella Stade on: 03/10/2019 11:16 AM   Modules accepted: Orders

## 2019-03-10 NOTE — Telephone Encounter (Signed)
I looked a couple days ago and it was not scanned yet. Will keep checking.

## 2019-03-11 ENCOUNTER — Emergency Department
Admission: EM | Admit: 2019-03-11 | Discharge: 2019-03-11 | Disposition: A | Discharge status: Home / Self Care | Payer: Federal, State, Local not specified - PPO | Source: Home / Self Care | Attending: Family Medicine | Admitting: Family Medicine

## 2019-03-11 ENCOUNTER — Other Ambulatory Visit: Payer: Self-pay

## 2019-03-11 ENCOUNTER — Encounter: Payer: Self-pay | Admitting: Emergency Medicine

## 2019-03-11 DIAGNOSIS — R519 Headache, unspecified: Secondary | ICD-10-CM | POA: Diagnosis not present

## 2019-03-11 LAB — POCT URINALYSIS DIP (MANUAL ENTRY)
Blood, UA: NEGATIVE
Glucose, UA: NEGATIVE mg/dL
Leukocytes, UA: NEGATIVE
Nitrite, UA: NEGATIVE
Protein Ur, POC: NEGATIVE mg/dL
Spec Grav, UA: 1.025 (ref 1.010–1.025)
Urobilinogen, UA: 0.2 E.U./dL
pH, UA: 6 (ref 5.0–8.0)

## 2019-03-11 LAB — POCT URINE PREGNANCY: Preg Test, Ur: NEGATIVE

## 2019-03-11 MED ORDER — KETOROLAC TROMETHAMINE 60 MG/2ML IM SOLN
60.0000 mg | Freq: Once | INTRAMUSCULAR | Status: AC
  Administered 2019-03-11: 60 mg via INTRAMUSCULAR

## 2019-03-11 NOTE — ED Provider Notes (Signed)
[facility]    CSN: [id number] Arrival date & time: 03/11/19  0903      History   Chief Complaint Chief Complaint  Patient presents with  . Nausea    HPI Andrea Baker is a 28 y.o. female.   Patient just started a new prescription of Pristiq last night.  At 9pm yesterday she developed nausea, without vomiting or diarrhea.  She awoke this morning with nausea.  At 12:30 pm today she developed a typical headache.  The history is provided by the patient.    Past Medical History:  Diagnosis Date  . Acne   . Anxiety and depression 05/02/2013   Intolerant to lexapro and effexor   . Constipation   . GERD 10/19/2009   Failed omeprazole and protonix.   Marland Kitchen GERD (gastroesophageal reflux disease)   . Mouth ulcer   . Psoriasis    of the scalp  . Wears contact lenses   . Wears glasses     Patient Active Problem List   Diagnosis Date Noted  . Psoriasis 10/24/2018  . GAD (generalized anxiety disorder) 10/13/2018  . Moderate episode of recurrent major depressive disorder ([place]) 10/13/2018  . Dyspepsia 10/13/2018  . Non-intractable vomiting   . Primary insomnia 06/06/2018  . Panic attacks 05/02/2018  . Unexplained weight loss 05/02/2018  . Nightmares 05/02/2018  . Grade I hemorrhoids 06/08/2017  . Seborrheic dermatitis of scalp 08/11/2016  . 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 Chronic idiopathic constipation 06/12/2015  . Eczema 05/17/2013  . Anxiety and depression 05/02/2013  . Concentration deficit 05/02/2013  . Allergic rhinitis 08/07/2011  . Gastroesophageal reflux disease 10/19/2009    Past Surgical History:  Procedure Laterality Date  . ESOPHAGEAL MANOMETRY N/A 09/12/2018   Procedure: ESOPHAGEAL MANOMETRY (EM);  Surgeon: Mauri Pole, MD;  Location: [facility];  Service: Endoscopy;  Laterality: N/A;  . ESOPHAGOGASTRODUODENOSCOPY     x2 have been normal and gastric emptying has been normal   . ESOPHAGOGASTRODUODENOSCOPY    . gastric empyting study      . [place] IMPEDANCE STUDY N/A 09/12/2018   Procedure: [place] IMPEDANCE STUDY;  Surgeon: Mauri Pole, MD;  Location: [facility];  Service: Endoscopy;  Laterality: N/A;    OB History   No obstetric history on file.      Home Medications    Prior to Admission medications   Medication Sig Start Date End Date Taking? Authorizing Provider  clobetasol (OLUX) 0.05 % topical foam Apply topically 2 (two) times daily. 08/11/16   Breeback, Jade L, PA-C  clonazePAM (KLONOPIN) 0.5 MG tablet clonazepam 0.5 mg tablet    [provider]  desvenlafaxine (PRISTIQ) 50 MG 24 hr tablet Take 1 tablet (50 mg total) by mouth daily. 03/10/19   Breeback, Royetta Car, PA-C  drospirenone-ethinyl estradiol (YAZ,GIANVI,LORYNA) 3-0.02 MG tablet Take 1 tablet by mouth daily. 08/20/17   Breeback, Royetta Car, PA-C  hydrocortisone (ANUSOL-HC) 2.5 % rectal cream Place 1 application rectally 2 (two) times daily. 10/11/18   Donella Stade, PA-C  metroNIDAZOLE (METROCREAM) 0.75 % cream APP EXT AA UNDER NOSE BID 07/11/18   [provider]  Multiple Vitamin (MULTIVITAMIN) capsule Take 1 capsule by mouth daily.    [provider]  ondansetron (ZOFRAN) 4 MG tablet Take 4 mg by mouth every 8 (eight) hours as needed for nausea or vomiting.    [provider]  prazosin (MINIPRESS) 2 MG capsule Take one to three times before bed for nightmares. 02/15/19   Breeback,  Jade L, PA-C  Probiotic Product (PROBIOTIC DAILY PO) Take by mouth.    [provider]  promethazine (PHENERGAN) 25 MG suppository Place 1 suppository (25 mg total) rectally every 6 (six) hours as needed for nausea or vomiting. 10/11/18   Breeback, Jade L, PA-C  promethazine (PHENERGAN) 25 MG tablet Take 1 tablet (25 mg total) by mouth every 6 (six) hours as needed for nausea or vomiting. Given only 12 pills 10/11/18   Tandy GawBreeback, Jade L, PA-C  spironolactone (ALDACTONE) 100 MG tablet Take 100 mg by mouth daily.    [provider]   mirtazapine (REMERON) 7.5 MG tablet Take 1 tablet (7.5 mg total) by mouth at bedtime. 07/21/18 10/03/18  Thresa RossAkhtar, Nadeem, MD  traZODone (DESYREL) 50 MG tablet Take 1 tablet (50 mg total) by mouth at bedtime. 06/03/18 07/21/18  Jomarie LongsBreeback, Jade L, PA-C    Family History Family History  Problem Relation Age of Onset  . Depression Mother   . Heart attack Father   . Diabetes Father        prediabetic  . Hypertension Father   . Hyperlipidemia Father   . Diabetes Paternal Grandmother   . Colon cancer Neg Hx   . Esophageal cancer Neg Hx   . Rectal cancer Neg Hx   . Stomach cancer Neg Hx     Social History Social History   Tobacco Use  . Smoking status: Never Smoker  . Smokeless tobacco: Never Used  Substance Use Topics  . Alcohol use: Yes    Comment: Hard selzer once very 3 months. Cant tolerate alcoloh  . Drug use: No     Allergies   Doxycycline and Augmentin [amoxicillin-pot clavulanate]   Review of Systems Review of Systems No sore throat No cough No pleuritic pain No wheezing No nasal congestion No post-nasal drainage No sinus pain/pressure No itchy/red eyes No earache No hemoptysis No SOB No fever/chills + nausea No vomiting No abdominal pain No diarrhea No urinary symptoms No skin rash + fatigue No myalgias + headache   Physical Exam Triage Vital Signs ED Triage Vitals  Enc Vitals Group     BP 03/11/19 0945 126/81     Pulse Rate 03/11/19 0945 88     Resp --      Temp 03/11/19 0945 98 F (36.7 C)     Temp Source 03/11/19 0945 Oral     SpO2 03/11/19 0945 98 %     Weight --      Height --      Head Circumference --      Peak Flow --      Pain Score 03/11/19 0939 0     Pain Loc --      Pain Edu? --      Excl. in [place]? --    No data found.  Updated Vital Signs BP 126/81 (BP Location: Right Arm)   Pulse 88   Temp 98 F (36.7 C) (Oral)   LMP 02/09/2019   SpO2 98%   Visual Acuity Right Eye Distance:   Left Eye Distance:   Bilateral  Distance:    Right Eye Near:   Left Eye Near:    Bilateral Near:     Physical Exam Nursing notes and Vital Signs reviewed. Appearance:  Patient appears stated age, and in no acute distress Eyes:  Pupils are equal, round, and reactive to light and accomodation.  Extraocular movement is intact. Fundi benign. Conjunctivae are not inflamed  Ears:  Canals normal.  Tympanic  membranes normal.  Nose:  Normal turbinates.  No sinus tenderness.   Pharynx:  Normal Neck:  Supple.  No adenopathy  Lungs:  Clear to auscultation.  Breath sounds are equal.  Moving air well. Heart:  Regular rate and rhythm without murmurs, rubs, or gallops.  Abdomen:  Nontender without masses or hepatosplenomegaly.  Bowel sounds are present.  No CVA or flank tenderness.  Extremities:  No edema.  Skin:  No rash present.  Neurologic:  Cranial nerves 2 through 12 are normal.  Patellar, achilles, and elbow reflexes are normal.  Cerebellar function is intact (finger-to-nose and rapid alternating hand movement).  Gait and station are normal.  Grip strength symmetric bilaterally.  Romberg negative.   UC Treatments / Results  Labs (all labs ordered are listed, but only abnormal results are displayed) Labs Reviewed  POCT URINALYSIS DIP (MANUAL ENTRY) - Abnormal; Notable for the following components:      Result Value   Color, UA brown (*)    Bilirubin, UA small (*)    Ketones, POC UA trace (5) (*)    All other components within normal limits  SARS-COV-2 RNA,(COVID-19) QUALITATIVE NAAT  POCT URINE PREGNANCY    EKG   Radiology No results found.  Procedures Procedures (including critical care time)  Medications Ordered in UC Medications  ketorolac (TORADOL) injection 60 mg (has no administration in time range)    Initial Impression / Assessment and Plan / UC Course  I have reviewed the triage vital signs and the nursing notes.  Pertinent labs & imaging results that were available during my care of the patient  were reviewed by me and considered in my medical decision making (see chart for details).    Nausea resulting from Pristiq? Or possible COVID19? Administered Toradol 60mg  IM  COVID19 send out.   Final Clinical Impressions(s) / UC Diagnoses   Final diagnoses:  Acute nonintractable headache, unspecified headache type     Discharge Instructions     Discontinue Pristiq and followup with your family doctor.  May take Promethazine as needed for nausea.  Stay well hydrated today.  Isolate yourself until COVID-19 test result is available.   If your COVID19 test is positive, then you are infected with the novel coronavirus and could give the virus to others.  Please continue isolation at home for at least 10 days since the start of your symptoms. Once you complete your 10 day quarantine, you may return to normal activities as long as you've not had a fever for over 24 hours (without taking fever reducing medicine) and your symptoms are improving. Please continue good preventive care measures, including:  frequent hand-washing, avoid touching your face, cover coughs/sneezes, stay out of crowds and keep a 6 foot distance from others.  Go to the nearest hospital emergency room if fever/cough/breathlessness are severe or illness seems like a threat to life.     ED Prescriptions    None        , MD 03/21/19 1146

## 2019-03-11 NOTE — Discharge Instructions (Addendum)
Discontinue Pristiq and followup with your family doctor.  May take Promethazine as needed for nausea.  Stay well hydrated today.  Isolate yourself until COVID-19 test result is available.   If your COVID19 test is positive, then you are infected with the novel coronavirus and could give the virus to others.  Please continue isolation at home for at least 10 days since the start of your symptoms. Once you complete your 10 day quarantine, you may return to normal activities as long as you've not had a fever for over 24 hours (without taking fever reducing medicine) and your symptoms are improving. Please continue good preventive care measures, including:  frequent hand-washing, avoid touching your face, cover coughs/sneezes, stay out of crowds and keep a 6 foot distance from others.  Go to the nearest hospital emergency room if fever/cough/breathlessness are severe or illness seems like a threat to life.

## 2019-03-11 NOTE — ED Triage Notes (Signed)
Pt states she started pristiq medication last night and this am she woke up with nausea and headache.

## 2019-03-13 ENCOUNTER — Encounter: Payer: Self-pay | Admitting: Physician Assistant

## 2019-03-13 LAB — SARS-COV-2 RNA,(COVID-19) QUALITATIVE NAAT: SARS CoV2 RNA: NOT DETECTED

## 2019-03-15 ENCOUNTER — Telehealth: Payer: Federal, State, Local not specified - PPO | Admitting: Physician Assistant

## 2019-03-23 ENCOUNTER — Emergency Department
Admission: EM | Admit: 2019-03-23 | Discharge: 2019-03-23 | Disposition: A | Discharge status: Home / Self Care | Payer: Federal, State, Local not specified - PPO | Source: Home / Self Care

## 2019-03-23 ENCOUNTER — Encounter: Payer: Self-pay | Admitting: Emergency Medicine

## 2019-03-23 ENCOUNTER — Other Ambulatory Visit: Payer: Self-pay

## 2019-03-23 DIAGNOSIS — Z20822 Contact with and (suspected) exposure to covid-19: Secondary | ICD-10-CM

## 2019-03-23 DIAGNOSIS — Z20828 Contact with and (suspected) exposure to other viral communicable diseases: Secondary | ICD-10-CM | POA: Diagnosis not present

## 2019-03-23 NOTE — Discharge Instructions (Signed)
Return if any problems.

## 2019-03-23 NOTE — ED Triage Notes (Signed)
Exposure to covid, asymptomatic

## 2019-03-25 LAB — SARS-COV-2 RNA,(COVID-19) QUALITATIVE NAAT: SARS CoV2 RNA: NOT DETECTED

## 2019-03-26 NOTE — ED Provider Notes (Signed)
[facility]    CSN: [id number] Arrival date & time: 03/23/19  1619      History   Chief Complaint Chief Complaint  Patient presents with  . Exposure to covid    HPI Andrea Baker is a 29 y.o. female.   Pt reports she was exposed to covid.  No fever, no chills no cough   The history is provided by the patient. No language interpreter was used.    Past Medical History:  Diagnosis Date  . Acne   . Anxiety and depression 05/02/2013   Intolerant to lexapro and effexor   . Constipation   . GERD 10/19/2009   Failed omeprazole and protonix.   Marland Kitchen GERD (gastroesophageal reflux disease)   . Mouth ulcer   . Psoriasis    of the scalp  . Wears contact lenses   . Wears glasses     Patient Active Problem List   Diagnosis Date Noted  . Psoriasis 10/24/2018  . GAD (generalized anxiety disorder) 10/13/2018  . Moderate episode of recurrent major depressive disorder (HCC) 10/13/2018  . Dyspepsia 10/13/2018  . Non-intractable vomiting   . Primary insomnia 06/06/2018  . Panic attacks 05/02/2018  . Unexplained weight loss 05/02/2018  . Nightmares 05/02/2018  . Grade I hemorrhoids 06/08/2017  . Seborrheic dermatitis of scalp 08/11/2016  . 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 Chronic idiopathic constipation 06/12/2015  . Eczema 05/17/2013  . Anxiety and depression 05/02/2013  . Concentration deficit 05/02/2013  . Allergic rhinitis 08/07/2011  . Gastroesophageal reflux disease 10/19/2009    Past Surgical History:  Procedure Laterality Date  . ESOPHAGEAL MANOMETRY N/A 09/12/2018   Procedure: ESOPHAGEAL MANOMETRY (EM);  Surgeon: Napoleon Form, MD;  Location: [facility];  Service: Endoscopy;  Laterality: N/A;  . ESOPHAGOGASTRODUODENOSCOPY     x2 have been normal and gastric emptying has been normal   . ESOPHAGOGASTRODUODENOSCOPY    . gastric empyting study     . [place] IMPEDANCE STUDY N/A 09/12/2018   Procedure: [place] IMPEDANCE STUDY;  Surgeon: Napoleon Form, MD;   Location: [facility];  Service: Endoscopy;  Laterality: N/A;    OB History   No obstetric history on file.      Home Medications    Prior to Admission medications   Medication Sig Start Date End Date Taking? Authorizing Provider  clobetasol (OLUX) 0.05 % topical foam Apply topically 2 (two) times daily. 08/11/16   Breeback, Jade L, PA-C  clonazePAM (KLONOPIN) 0.5 MG tablet clonazepam 0.5 mg tablet    [provider]  drospirenone-ethinyl estradiol (YAZ,GIANVI,LORYNA) 3-0.02 MG tablet Take 1 tablet by mouth daily. 08/20/17   Breeback, Lonna Cobb, PA-C  hydrocortisone (ANUSOL-HC) 2.5 % rectal cream Place 1 application rectally 2 (two) times daily. 10/11/18   Breeback, Lonna Cobb, PA-C  Multiple Vitamin (MULTIVITAMIN) capsule Take 1 capsule by mouth daily.    [provider]  ondansetron (ZOFRAN) 4 MG tablet Take 4 mg by mouth every 8 (eight) hours as needed for nausea or vomiting.    [provider]  prazosin (MINIPRESS) 2 MG capsule Take one to three times before bed for nightmares. 02/15/19   Breeback, Lonna Cobb, PA-C  Probiotic Product (PROBIOTIC DAILY PO) Take by mouth.    [provider]  promethazine (PHENERGAN) 25 MG suppository Place 1 suppository (25 mg total) rectally every 6 (six) hours as needed for nausea or vomiting. 10/11/18   Breeback, Jade L, PA-C  promethazine (PHENERGAN) 25 MG tablet Take 1 tablet (25  mg total) by mouth every 6 (six) hours as needed for nausea or vomiting. Given only 12 pills 10/11/18   Iran Planas L, PA-C  spironolactone (ALDACTONE) 100 MG tablet Take 100 mg by mouth daily.    [provider]  mirtazapine (REMERON) 7.5 MG tablet Take 1 tablet (7.5 mg total) by mouth at bedtime. 07/21/18 10/03/18  Merian Capron, MD  traZODone (DESYREL) 50 MG tablet Take 1 tablet (50 mg total) by mouth at bedtime. 06/03/18 07/21/18  Donella Stade, PA-C    Family History Family History  Problem Relation Age of Onset  . Depression Mother    . Heart attack Father   . Diabetes Father        prediabetic  . Hypertension Father   . Hyperlipidemia Father   . Diabetes Paternal Grandmother   . Colon cancer Neg Hx   . Esophageal cancer Neg Hx   . Rectal cancer Neg Hx   . Stomach cancer Neg Hx     Social History Social History   Tobacco Use  . Smoking status: Never Smoker  . Smokeless tobacco: Never Used  Substance Use Topics  . Alcohol use: Yes    Comment: Hard selzer once very 3 months. Cant tolerate alcoloh  . Drug use: No     Allergies   Doxycycline, Pristiq [desvenlafaxine], and Augmentin [amoxicillin-pot clavulanate]   Review of Systems Review of Systems  Constitutional: Negative for fever.  Respiratory: Negative for cough.   All other systems reviewed and are negative.    Physical Exam Triage Vital Signs ED Triage Vitals  Enc Vitals Group     BP 03/23/19 1645 112/77     Pulse Rate 03/23/19 1645 73     Resp --      Temp 03/23/19 1645 97.7 F (36.5 C)     Temp Source 03/23/19 1645 Oral     SpO2 03/23/19 1645 100 %     Weight 03/23/19 1646 112 lb (50.8 kg)     Height 03/23/19 1646 5\' 5"  (1.651 m)     Head Circumference --      Peak Flow --      Pain Score 03/23/19 1646 0     Pain Loc --      Pain Edu? --      Excl. in [place]? --    No data found.  Updated Vital Signs BP 112/77 (BP Location: Right Arm)   Pulse 73   Temp 97.7 F (36.5 C) (Oral)   Ht 5\' 5"  (1.651 m)   Wt 50.8 kg   SpO2 100%   BMI 18.64 kg/m   Visual Acuity Right Eye Distance:   Left Eye Distance:   Bilateral Distance:    Right Eye Near:   Left Eye Near:    Bilateral Near:     Physical Exam Vitals and nursing note reviewed.  Constitutional:      Appearance: She is well-developed.  HENT:     Head: Normocephalic.  Cardiovascular:     Rate and Rhythm: Normal rate.  Pulmonary:     Effort: Pulmonary effort is normal.  Abdominal:     General: There is no distension.  Musculoskeletal:        General: Normal range  of motion.     Cervical back: Normal range of motion.  Neurological:     Mental Status: She is alert and oriented to person, place, and time.      UC Treatments / Results  Labs (all labs ordered  are listed, but only abnormal results are displayed) Labs Reviewed  SARS-COV-2 RNA,(COVID-19) QUALITATIVE NAAT    EKG   Radiology No results found.  Procedures Procedures (including critical care time)  Medications Ordered in UC Medications - No data to display  Initial Impression / Assessment and Plan / UC Course  I have reviewed the triage vital signs and the nursing notes.  Pertinent labs & imaging results that were available during my care of the patient were reviewed by me and considered in my medical decision making (see chart for details).     MDM  Covid ordered.   Final Clinical Impressions(s) / UC Diagnoses   Final diagnoses:  Exposure to COVID-19 virus     Discharge Instructions     Return if any problems.    ED Prescriptions    None     PDMP not reviewed this encounter.  An After Visit Summary was printed and given to the patient.    Elson Areas, [place] 03/26/19 1032

## 2019-03-30 ENCOUNTER — Ambulatory Visit (INDEPENDENT_AMBULATORY_CARE_PROVIDER_SITE_OTHER): Payer: Federal, State, Local not specified - PPO | Admitting: Psychiatry

## 2019-03-30 ENCOUNTER — Ambulatory Visit (HOSPITAL_COMMUNITY): Payer: Federal, State, Local not specified - PPO | Admitting: Psychiatry

## 2019-03-30 ENCOUNTER — Other Ambulatory Visit: Payer: Self-pay

## 2019-03-30 DIAGNOSIS — F331 Major depressive disorder, recurrent, moderate: Secondary | ICD-10-CM | POA: Diagnosis not present

## 2019-03-30 DIAGNOSIS — F411 Generalized anxiety disorder: Secondary | ICD-10-CM | POA: Diagnosis not present

## 2019-03-30 DIAGNOSIS — F410 Panic disorder [episodic paroxysmal anxiety] without agoraphobia: Secondary | ICD-10-CM

## 2019-03-30 MED ORDER — TRAZODONE HCL 100 MG PO TABS: 100.0000 mg | ORAL_TABLET | Freq: Every day | ORAL | Status: AC

## 2019-03-30 NOTE — Progress Notes (Signed)
BH MD/PA/NP OP Progress Note  03/30/2019 12:28 PM Andrea Baker  MRN:  [id number]  Chief Complaint: And struggling with my sleep, I am tired a lot because of it, I do not think my mood has improved with TMS HPI: Patient is a 29 year old female diagnosed with major depressive disorder, recurrent, severe and panic disorder who presents today for follow-up visit after completing [place].  Patient reports on a scale of 0-10, with 0 being no symptoms and 10 being the worst her depression is still a 6 out of 10.  She has that not sleeping well is an aggravating factor.  She currently denies any relieving factors.  Patient reports that she tried Pristiq, adds that it made her have a migraine headache, states that she is no longer taking it.  Patient reports that she continues to have nightmares which makes her sleep fragmented and so she feels tired in the morning.  Discussed in length sleep hygiene with patient and the need to try medication to help her sleep well at night as it might help with her mood, her migraine and her anxiety.  Patient states that she is okay with seeing a psychiatrist, patient being referred to Dr. Dwyane Dee for outpatient follow-up.  Also discussed with patient in length the need to have a sleep study can patient continues to have sleep issues after being tried on trazodone along with Minipress.  Patient denies any psychotic symptoms, any thoughts of self-harm or harm to others.  She denies any other complaints at this visit.  Patient states that she does have a copy of her genetic testing and is careful in what medications she is prescribed as she has a lot of side effects with medications. Visit Diagnosis:    ICD-10-CM   1. MDD (major depressive disorder), recurrent episode, moderate (HCC)  F33.1   2. Panic attacks  F41.0   3. GAD (generalized anxiety disorder)  F41.1     Past Psychiatric History: Patient has completed [place] but continues to endorse depressive symptoms, patient struggling  with her sleep, no other changes in her past psychiatric history from her previous visit in September 2020  Past Medical History:  Past Medical History:  Diagnosis Date  . Acne   . Anxiety and depression 05/02/2013   Intolerant to lexapro and effexor   . Constipation   . GERD 10/19/2009   Failed omeprazole and protonix.   [place] GERD (gastroesophageal reflux disease)   . Mouth ulcer   . Psoriasis    of the scalp  . Wears contact lenses   . Wears glasses     Past Surgical History:  Procedure Laterality Date  . ESOPHAGEAL MANOMETRY N/A 09/12/2018   Procedure: ESOPHAGEAL MANOMETRY (EM);  Surgeon: Mauri Pole, MD;  Location: [facility];  Service: Endoscopy;  Laterality: N/A;  . ESOPHAGOGASTRODUODENOSCOPY     x2 have been normal and gastric emptying has been normal   . ESOPHAGOGASTRODUODENOSCOPY    . gastric empyting study     . [place] IMPEDANCE STUDY N/A 09/12/2018   Procedure: [place] IMPEDANCE STUDY;  Surgeon: Mauri Pole, MD;  Location: [facility];  Service: Endoscopy;  Laterality: N/A;    Family Psychiatric History: Unchanged from previous visit  Family History:  Family History  Problem Relation Age of Onset  . Depression Mother   . Heart attack Father   . Diabetes Father        prediabetic  . Hypertension Father   . Hyperlipidemia Father   . Diabetes Paternal  Grandmother   . Colon cancer Neg Hx   . Esophageal cancer Neg Hx   . Rectal cancer Neg Hx   . Stomach cancer Neg Hx     Social History:  Social History   Socioeconomic History  . Marital status: Single    Spouse name: Not on file  . Number of children: Not on file  . Years of education: Not on file  . Highest education level: Not on file  Occupational History  . Not on file  Tobacco Use  . Smoking status: Never Smoker  . Smokeless tobacco: Never Used  Substance and Sexual Activity  . Alcohol use: Yes    Comment: Hard selzer once very 3 months. Cant tolerate alcoloh  . Drug use: No  . Sexual  activity: Not Currently  Other Topics Concern  . Not on file  Social History Narrative   LIVES WITH PARENTS.  STUDENT AT [employer]).     Social Determinants of Health   Financial Resource Strain:   . Difficulty of Paying Living Expenses: Not on file  Food Insecurity:   . Worried About Programme researcher, broadcasting/film/video in the Last Year: Not on file  . Ran Out of Food in the Last Year: Not on file  Transportation Needs:   . Lack of Transportation (Medical): Not on file  . Lack of Transportation (Non-Medical): Not on file  Physical Activity:   . Days of Exercise per Week: Not on file  . Minutes of Exercise per Session: Not on file  Stress:   . Feeling of Stress : Not on file  Social Connections:   . Frequency of Communication with Friends and Family: Not on file  . Frequency of Social Gatherings with Friends and Family: Not on file  . Attends Religious Services: Not on file  . Active Member of Clubs or Organizations: Not on file  . Attends Banker Meetings: Not on file  . Marital Status: Not on file    Allergies:  Allergies  Allergen Reactions  . Doxycycline     Nausea and vomiting   . Pristiq [Desvenlafaxine]     Nausea/vomiting/headache  . Augmentin [Amoxicillin-Pot Clavulanate]     Vomiting when taken 10 years ago. Has taken amoxicillin without problems.    Metabolic Disorder Labs: No results found for: HGBA1C, MPG No results found for: PROLACTIN Lab Results  Component Value Date   CHOL 153 09/29/2013   TRIG 53 09/29/2013   HDL 47 09/29/2013   CHOLHDL 3.3 09/29/2013   VLDL 11 09/29/2013   LDLCALC 95 09/29/2013   Lab Results  Component Value Date   TSH 1.92 04/29/2018    Therapeutic Level Labs: No results found for: LITHIUM No results found for: VALPROATE No components found for:  CBMZ  Current Medications: Current Outpatient Medications  Medication Sig Dispense Refill  . clobetasol (OLUX) 0.05 % topical foam Apply topically 2 (two) times daily. 50 g  5  . clonazePAM (KLONOPIN) 0.5 MG tablet clonazepam 0.5 mg tablet    . drospirenone-ethinyl estradiol (YAZ,GIANVI,LORYNA) 3-0.02 MG tablet Take 1 tablet by mouth daily. 30 tablet 11  . hydrocortisone (ANUSOL-HC) 2.5 % rectal cream Place 1 application rectally 2 (two) times daily. 28 g 2  . Multiple Vitamin (MULTIVITAMIN) capsule Take 1 capsule by mouth daily.    . ondansetron (ZOFRAN) 4 MG tablet Take 4 mg by mouth every 8 (eight) hours as needed for nausea or vomiting.    . prazosin (MINIPRESS) 2 MG capsule Take  one to three times before bed for nightmares. 90 capsule 1  . Probiotic Product (PROBIOTIC DAILY PO) Take by mouth.    . promethazine (PHENERGAN) 25 MG suppository Place 1 suppository (25 mg total) rectally every 6 (six) hours as needed for nausea or vomiting. 12 each 1  . promethazine (PHENERGAN) 25 MG tablet Take 1 tablet (25 mg total) by mouth every 6 (six) hours as needed for nausea or vomiting. Given only 12 pills 30 tablet 1  . spironolactone (ALDACTONE) 100 MG tablet Take 100 mg by mouth daily.     No current facility-administered medications for this visit.     Musculoskeletal: Strength & Muscle Tone: could not be done as it was a video visit Gait & Station: patient was sitting Patient leans: N/A  Psychiatric Specialty Exam: Review of Systems  There were no vitals taken for this visit.There is no height or weight on file to calculate BMI.  General Appearance: Casual  Eye Contact:  Fair  Speech:  Clear and Coherent and Normal Rate  Volume:  Normal  Mood:  Anxious and Dysphoric  Affect:  Congruent and tired  Thought Process:  Coherent, Goal Directed and Descriptions of Associations: Intact  Orientation:  Full (Time, Place, and Person)  Thought Content: Logical and Rumination   Suicidal Thoughts:  No  Homicidal Thoughts:  No  Memory:  Immediate;   Fair Recent;   Fair Remote;   Fair  Judgement:  Intact  Insight:  Present  Psychomotor Activity:  Mannerisms   Concentration:  Concentration: Fair and Attention Span: Fair  Recall:  Fair  Fund of Knowledge: Good  Language: Good  Akathisia:  No  Handed:  Right  AIMS (if indicated): not done  Assets:  Communication Skills Desire for Improvement Financial Resources/Insurance Housing Social Support Transportation  ADL's:  Intact  Cognition: WNL  Sleep:  Poor   Screenings: GAD-7     Video Visit from 02/15/2019 in [facility] Video Visit from 10/11/2018 in [facility] Office Visit from 06/03/2018 in [facility] Office Visit from 04/29/2018 in [facility]  Total GAD-7 Score  19  18  15  16     PHQ2-9     Video Visit from 02/15/2019 in [facility] Office Visit from 11/24/2018 in [facility] Video Visit from 10/11/2018 in [facility] Office Visit from 06/03/2018 in [facility] Visit from 04/29/2018 in [facility]  PHQ-2 Total Score  5  6  4  6  5   PHQ-9 Total Score  21  23  18  20  16        Assessment and Plan: Discussed in length with patient the need to sleep well at night.  Discussed various treatment options and patient restarting trazodone at 100 mg at bedtime to help with sleep along with taking Minipress 2 mg at bedtime.  Risks and benefits were discussed with patient and she is agreeable with this plan to try it for a week to see if it helps with sleep.  Also discussed next steps if sleep has not improved,  the need to have a sleep study due to patient's continued sleep issues along with nightmares. This is one of patient's biggest stressor  Discussed with patient in regards to her major  depressive disorder and generalized anxiety disorder, to see if improved sleep helps  with her mood, her concentration and fatigue.  Discussed that we could look at next steps once patient sleep is stable to reassess her depressive symptoms and patient is agreeable with this plan.  This was a 20-minute visit with the patient via WebEx( video visit) to reassess patient as she has completed TMS.  Patient also to starting seeing Dr Evelene Croon at outpatient [place] office for psychiatric medication management Patient was at home, seen via video visit.   Nelly Rout, MD 03/30/2019, 12:28 PM

## 2019-03-31 ENCOUNTER — Ambulatory Visit (HOSPITAL_COMMUNITY): Payer: Federal, State, Local not specified - PPO | Admitting: Psychiatry

## 2019-04-11 ENCOUNTER — Ambulatory Visit: Payer: Federal, State, Local not specified - PPO | Admitting: Psychiatry

## 2019-04-11 ENCOUNTER — Ambulatory Visit (INDEPENDENT_AMBULATORY_CARE_PROVIDER_SITE_OTHER): Payer: Federal, State, Local not specified - PPO | Admitting: Psychiatry

## 2019-04-11 ENCOUNTER — Other Ambulatory Visit: Payer: Self-pay

## 2019-04-11 ENCOUNTER — Encounter: Payer: Self-pay | Admitting: Psychiatry

## 2019-04-11 DIAGNOSIS — F411 Generalized anxiety disorder: Secondary | ICD-10-CM

## 2019-04-11 DIAGNOSIS — F410 Panic disorder [episodic paroxysmal anxiety] without agoraphobia: Secondary | ICD-10-CM

## 2019-04-11 DIAGNOSIS — F331 Major depressive disorder, recurrent, moderate: Secondary | ICD-10-CM | POA: Diagnosis not present

## 2019-04-11 MED ORDER — MIRTAZAPINE 15 MG PO TABS: 15.0000 mg | ORAL_TABLET | Freq: Every day | ORAL | 1 refills | Status: DC
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
## 2019-04-17 DIAGNOSIS — F331 Major depressive disorder, recurrent, moderate: Secondary | ICD-10-CM | POA: Diagnosis not present

## 2019-04-19 ENCOUNTER — Telehealth: Payer: Self-pay

## 2019-04-19 MED ORDER — DOXEPIN HCL 10 MG PO CAPS: ORAL_CAPSULE | ORAL | 0 refills | Status: DC

## 2019-04-19 NOTE — Telephone Encounter (Signed)
I called the patient and spoke with her.  She informed that mirtazapine 15 mg dose was too strong for her.  She felt very sleepy during the daytime.  She stated that she slept off pretty much the entire weekend.  She was unable to do anything that she had planned to do.  She has taken half dose 7.5 mg of mirtazapine in the past and did not find that to be very helpful so is reluctant to try it again.  She also reported that her dreams seem to be more vivid when she was taking mirtazapine. Patient was offered trial of doxepin for insomnia.  She has never tried it in the past.  She stated she is willing to try it.  She was recommended to start with one10 mg capsule and increase it to 2 capsules if 1 capsule is not enough.

## 2019-04-19 NOTE — Telephone Encounter (Signed)
pt states she can not take the mirtazapine it is making her so sleepy that she afraid to drive.

## 2019-04-19 NOTE — Telephone Encounter (Signed)
Okay, please ask if she would like to try taking half tablet of Mirtazapine to see if that helps her mood and sleep at night with out causing daytime sleepiness.  I want to evaluate her for ADHD which could not be done at the time of her last visit due to time constraints. We will discuss other options at the time of her next visit on 05/10/2019.

## 2019-04-19 NOTE — Telephone Encounter (Signed)
i spoke with patient in regards of you wanting to know if patient would be willing to take the mirtazapine 1/2.  when i spoke with patient she stated that she already tried that and it was no better.

## 2019-05-04 DIAGNOSIS — F331 Major depressive disorder, recurrent, moderate: Secondary | ICD-10-CM | POA: Diagnosis not present

## 2019-05-08 ENCOUNTER — Telehealth: Payer: Self-pay

## 2019-05-08 NOTE — Telephone Encounter (Signed)
Pt needs refills    doxepin (SINEQUAN) 10 MG capsule Pt needMedication Date: 04/19/2019 Department: [facility] Ordering/Authorizing: Zena Amos, MD  Order Providers  Prescribing Provider Encounter Provider  Zena Amos, MD Elvina Mattes, CMA  Outpatient Medication Detail   Disp Refills Start End   doxepin (SINEQUAN) 10 MG capsule 45 capsule 0 04/19/2019    Sig: Take 1 to 2 capsules at bedtime as needed for sleep.   Sent to pharmacy as: doxepin (SINEQUAN) 10 MG capsule   E-Prescribing Status: Receipt confirmed by pharmacy (04/19/2019 11:39 AM EST)

## 2019-05-09 MED ORDER — DOXEPIN HCL 10 MG PO CAPS: ORAL_CAPSULE | ORAL | 0 refills | Status: DC

## 2019-05-09 NOTE — Telephone Encounter (Signed)
Done

## 2019-05-10 ENCOUNTER — Other Ambulatory Visit: Payer: Self-pay

## 2019-05-10 ENCOUNTER — Encounter: Payer: Self-pay | Admitting: Psychiatry

## 2019-05-10 ENCOUNTER — Ambulatory Visit (INDEPENDENT_AMBULATORY_CARE_PROVIDER_SITE_OTHER): Payer: Federal, State, Local not specified - PPO | Admitting: Psychiatry

## 2019-05-10 ENCOUNTER — Telehealth: Payer: Self-pay

## 2019-05-10 DIAGNOSIS — F411 Generalized anxiety disorder: Secondary | ICD-10-CM

## 2019-05-10 DIAGNOSIS — F331 Major depressive disorder, recurrent, moderate: Secondary | ICD-10-CM

## 2019-05-10 DIAGNOSIS — F410 Panic disorder [episodic paroxysmal anxiety] without agoraphobia: Secondary | ICD-10-CM | POA: Diagnosis not present

## 2019-05-10 DIAGNOSIS — G4700 Insomnia, unspecified: Secondary | ICD-10-CM | POA: Diagnosis not present

## 2019-05-10 DIAGNOSIS — F900 Attention-deficit hyperactivity disorder, predominantly inattentive type: Secondary | ICD-10-CM | POA: Insufficient documentation

## 2019-05-10 MED ORDER — SUVOREXANT 10 MG PO TABS: 10.0000 mg | ORAL_TABLET | Freq: Every day | ORAL | 0 refills | Status: DC

## 2019-05-10 MED ORDER — LAMOTRIGINE 25 MG PO TABS: 25.0000 mg | ORAL_TABLET | Freq: Every evening | ORAL | 0 refills | Status: DC

## 2019-05-10 NOTE — Telephone Encounter (Signed)
pt insurance called states that insurance will not pay for the belsomra that they only cover eszopiclone, zaleplon capsule or zolpidem

## 2019-05-10 NOTE — Telephone Encounter (Signed)
What about Temazepam ? Is that covered ?

## 2019-05-10 NOTE — Telephone Encounter (Signed)
try to do a prior auth for belsomra for patient to see if we can get approved.
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
## 2019-05-11 NOTE — Telephone Encounter (Signed)
Insurance will only cover the ezipiclone, zoleplon capsule and zolpidem

## 2019-05-12 ENCOUNTER — Telehealth (HOSPITAL_COMMUNITY): Payer: Self-pay

## 2019-05-12 NOTE — Telephone Encounter (Signed)
Have been trying to reach her at her cell phone but call keeps going to voicemail. If the pt calls then inform her regarding the insurance not covering suvorexant. And will go from there.

## 2019-05-15 MED ORDER — TEMAZEPAM 7.5 MG PO CAPS: ORAL_CAPSULE | ORAL | 0 refills | Status: DC

## 2019-05-15 NOTE — Telephone Encounter (Signed)
I spoke with the patient on the phone.  I informed the patient about insurance not covering Belsomra.  Patient was offered trial of temazepam as she has not been using clonazepam frequently.  She reported that she has new clonazepam more than a couple of weeks now. She was also informed of Lunesta and Kathaleen Bury however she was advised to start a trial of temazepam first. Potential side effects of medication and risks vs benefits of treatment vs non-treatment were explained and discussed. All questions were answered.  Patient reported she tried Lamictal and when she took it at night she noticed that she started to have more vivid dreams and therefore she has switched its time to morning time however she still has been having vivid nightmares.  Patient was advised to continue taking the medication morning and try temazepam 7.5 mg at bedtime.  She was advised to take 2 capsules after a few days if 1 capsule is ineffective for sleep.

## 2019-05-18 ENCOUNTER — Ambulatory Visit: Payer: Federal, State, Local not specified - PPO | Admitting: Psychiatry

## 2019-05-29 DIAGNOSIS — F331 Major depressive disorder, recurrent, moderate: Secondary | ICD-10-CM | POA: Diagnosis not present

## 2019-06-08 ENCOUNTER — Ambulatory Visit (INDEPENDENT_AMBULATORY_CARE_PROVIDER_SITE_OTHER): Payer: Federal, State, Local not specified - PPO | Admitting: Psychiatry

## 2019-06-08 ENCOUNTER — Other Ambulatory Visit: Payer: Self-pay

## 2019-06-08 ENCOUNTER — Encounter: Payer: Self-pay | Admitting: Psychiatry

## 2019-06-08 DIAGNOSIS — F410 Panic disorder [episodic paroxysmal anxiety] without agoraphobia: Secondary | ICD-10-CM | POA: Diagnosis not present

## 2019-06-08 DIAGNOSIS — F900 Attention-deficit hyperactivity disorder, predominantly inattentive type: Secondary | ICD-10-CM

## 2019-06-08 DIAGNOSIS — F331 Major depressive disorder, recurrent, moderate: Secondary | ICD-10-CM

## 2019-06-08 DIAGNOSIS — F411 Generalized anxiety disorder: Secondary | ICD-10-CM | POA: Diagnosis not present

## 2019-06-08 DIAGNOSIS — F515 Nightmare disorder: Secondary | ICD-10-CM

## 2019-06-08 MED ORDER — AMPHETAMINE-DEXTROAMPHET ER 10 MG PO CP24: 10.0000 mg | ORAL_CAPSULE | ORAL | 0 refills | Status: DC

## 2019-06-08 MED ORDER — FETZIMA 20 MG PO CP24: 20.0000 mg | ORAL_CAPSULE | Freq: Every day | ORAL | 1 refills | Status: DC

## 2019-06-08 NOTE — Progress Notes (Signed)
[facility] Progress Note  I connected with  Andrea Baker on 06/08/19 by a video enabled telemedicine application and verified that I am speaking with the correct person using two identifiers.   I discussed the limitations of evaluation and management by telemedicine. The patient expressed understanding and agreed to proceed.    06/08/2019 8:58 AM Andrea Baker  MRN:  [id number]  Chief Complaint: " My sadness is bad during the day. I feel sad like someone would feel if they were told they have lost a loved one."  HPI: Patient stated that she continues to feel sad all the time.  She cannot shake this feeling.  She stated that she worries about things like she is going to lose her dog one day instead of focusing on spending time with him at present. She also reported that she struggling with concentration and completing her tasks.  She stated that currently she is about a month behind in her charting at work.  There is concern about her getting in trouble for that.  She was prescribed Lamictal 25 mg at bedtime on February 17 at the time of her last visit.  Patient stated that she took that medicine for about 8 days and felt that it caused her to have more frequent nightmares and dreams as compared to the past.  She thought it would get better with time however it did not change and after 8 days when she stopped taking the medicine the frequency of her nightmares it reduced back to 1 or 2 per night. She was also prescribed temazepam to help her sleep better and to see if that would also help with her nightmares.  However she noticed that with temazepam she was waking up early around 5 AM instead of her usual time.  She also felt that she did not get good solid night sleep while on temazepam which defeated its purpose.  She reported she took temazepam for about a week.  Patient was asked if she would like to try Fetzima at this point as according to her pharmacokinetic results it can be considered as a  safe option.  Patient stated that she is worried since even Pristiq was in the green zone and despite that she had a horrible reaction 20 to 30 minutes after taking Pristiq which resulted in a visit to the urgent care.  She asked if she could try Adderall to target her poor concentration first.  She stated that her sister whose in her early 8s has taken it and has had some response to it.  She also reported that one of her friends recently told her that when they started taking Adderall they noticed improvement in their mood along with concentration abilities.  Patient was offered the option of trying Adderall XR in the morning.  Patient was agreeable to try it.  Writer recommended that we also go ahead and send the prescription for Fetzima at this point so in case Adderall XR does not help she can try Fetzima over the next few weeks before her next appointment.  Regarding nightmares, writer asked if she has ever taken higher dose of prazosin.  Patient currently takes 2 mg at bedtime, she reported that she did try 4 mg in the past and that did not really help much.  Patient was agreeable to try the higher dose of 6 mg to see if that would make any difference.  Patient was advised to titrate the dose to 4 mg for a few  days and then go up to 6 mg to see if that helps the nightmares.    Visit Diagnosis:    ICD-10-CM   1. MDD (major depressive disorder), recurrent episode, moderate (HCC)  F33.1   2. GAD (generalized anxiety disorder)  F41.1   3. Panic attacks  F41.0   4. 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 Acne   . Anxiety and depression 05/02/2013   Intolerant to lexapro and effexor   . Constipation   . GERD 10/19/2009   Failed omeprazole and protonix.   Marland Kitchen GERD (gastroesophageal  reflux disease)   . Mouth ulcer   . Psoriasis    of the scalp  . Wears contact lenses   . Wears glasses     Past Surgical History:  Procedure Laterality Date  . ESOPHAGEAL MANOMETRY N/A 09/12/2018   Procedure: ESOPHAGEAL MANOMETRY (EM);  Surgeon: Napoleon Form, MD;  Location: [facility];  Service: Endoscopy;  Laterality: N/A;  . ESOPHAGOGASTRODUODENOSCOPY     x2 have been normal and gastric emptying has been normal   . ESOPHAGOGASTRODUODENOSCOPY    . gastric empyting study     . PH IMPEDANCE STUDY N/A 09/12/2018   Procedure: PH IMPEDANCE STUDY;  Surgeon: Napoleon Form, MD;  Location: [facility];  Service: Endoscopy;  Laterality: N/A;    Family Psychiatric History: Mother- depression and is currently prescribed Effexor.  Mother also has been diagnosed with ADHD and has tried Vyvanse in the past.  Family History:  Family History  Problem Relation Age of Onset  . Depression Mother   . Heart attack Father   . Diabetes Father        prediabetic  . Hypertension Father   . Hyperlipidemia Father   . Diabetes Paternal Grandmother   . Colon cancer Neg Hx   . Esophageal cancer Neg Hx   . Rectal cancer Neg Hx   . Stomach cancer Neg Hx     Social History:  Social History   Socioeconomic History  . Marital status: Single    Spouse name: Not on file  . Number of children: Not on file  . Years of education: Not on file  . Highest education level: Not on file  Occupational History  . Not on file  Tobacco Use  . Smoking status: Never Smoker  . Smokeless tobacco: Never Used  Substance and Sexual Activity  . Alcohol use: Yes    Comment: Hard selzer once very 3 months. Cant tolerate alcoloh  . Drug use: No  . Sexual activity: Not Currently  Other Topics Concern  . Not on file  Social History Narrative   LIVES WITH PARENTS.  STUDENT AT [employer]).     Social Determinants of Health   Financial Resource Strain:   . Difficulty of Paying Living Expenses:   Food  Insecurity:   . Worried About Programme researcher, broadcasting/film/video in the Last Year:   . Barista in the Last Year:   Transportation Needs:   . Freight forwarder (Medical):   Marland Kitchen Lack of Transportation (Non-Medical):   Physical Activity:   . Days of Exercise per Week:   . Minutes of Exercise per Session:   Stress:   . Feeling of Stress :   Social Connections:   .  Frequency of Communication with Friends and Family:   . Frequency of Social Gatherings with Friends and Family:   . Attends Religious Services:   . Active Member of Clubs or Organizations:   . Attends Banker Meetings:   Marland Kitchen Marital Status:     Allergies:  Allergies  Allergen Reactions  . Doxycycline     Nausea and vomiting   . Pristiq [Desvenlafaxine]     Nausea/vomiting/headache  . Augmentin [Amoxicillin-Pot Clavulanate]     Vomiting when taken 10 years ago. Has taken amoxicillin without problems.    Metabolic Disorder Labs: No results found for: HGBA1C, MPG No results found for: PROLACTIN Lab Results  Component Value Date   CHOL 153 09/29/2013   TRIG 53 09/29/2013   HDL 47 09/29/2013   CHOLHDL 3.3 09/29/2013   VLDL 11 09/29/2013   LDLCALC 95 09/29/2013   Lab Results  Component Value Date   TSH 1.92 04/29/2018    Therapeutic Level Labs: No results found for: LITHIUM No results found for: VALPROATE No components found for:  CBMZ  Current Medications: Current Outpatient Medications  Medication Sig Dispense Refill  . clobetasol (OLUX) 0.05 % topical foam Apply topically 2 (two) times daily. 50 g 5  . clonazePAM (KLONOPIN) 0.5 MG tablet clonazepam 0.5 mg tablet    . drospirenone-ethinyl estradiol (YAZ,GIANVI,LORYNA) 3-0.02 MG tablet Take 1 tablet by mouth daily. 30 tablet 11  . hydrocortisone (ANUSOL-HC) 2.5 % rectal cream Place 1 application rectally 2 (two) times daily. 28 g 2  . lamoTRIgine (LAMICTAL) 25 MG tablet Take 1 tablet (25 mg total) by mouth every evening. 30 tablet 0  . Multiple  Vitamin (MULTIVITAMIN) capsule Take 1 capsule by mouth daily.    . ondansetron (ZOFRAN) 4 MG tablet Take 4 mg by mouth every 8 (eight) hours as needed for nausea or vomiting.    . prazosin (MINIPRESS) 2 MG capsule Take one to three times before bed for nightmares. 90 capsule 1  . Probiotic Product (PROBIOTIC DAILY PO) Take by mouth.    . promethazine (PHENERGAN) 25 MG suppository Place 1 suppository (25 mg total) rectally every 6 (six) hours as needed for nausea or vomiting. 12 each 1  . promethazine (PHENERGAN) 25 MG tablet Take 1 tablet (25 mg total) by mouth every 6 (six) hours as needed for nausea or vomiting. Given only 12 pills 30 tablet 1  . spironolactone (ALDACTONE) 100 MG tablet Take 100 mg by mouth daily.    . Suvorexant 10 MG TABS Take 10 mg by mouth at bedtime. 30 tablet 0  . temazepam (RESTORIL) 7.5 MG capsule Take 1 to 2 capsules at bedtime as needed for sleep. 60 capsule 0   No current facility-administered medications for this visit.    Musculoskeletal: Strength & Muscle Tone: unable to assess due to telemed visit Gait & Station: unable to assess due to telemed visit Patient leans: unable to assess due to telemed visit  Psychiatric Specialty Exam: Review of Systems  There were no vitals taken for this visit.There is no height or weight on file to calculate BMI.  General Appearance: Well Groomed  Eye Contact:  Good  Speech:  Clear and Coherent and Normal Rate  Volume:  Normal  Mood:  Depressed, "Sad"  Affect:  Congruent  Thought Process:  Goal Directed and Descriptions of Associations: Intact  Orientation:  Full (Time, Place, and Person)  Thought Content: Logical   Suicidal Thoughts:  No  Homicidal Thoughts:  No  Memory:  Immediate;   Good Recent;   Good  Judgement:  Fair  Insight:  Fair  Psychomotor Activity:  Normal  Concentration:  Concentration: Good and Attention Span: Fair  Recall:  Good  Fund of Knowledge: Good  Language: Good  Akathisia:  Negative   Handed:  Right  AIMS (if indicated): not done  Assets:  Communication Skills Desire for Improvement Financial Resources/Insurance Housing  ADL's:  Intact  Cognition: WNL  Sleep:  Fair, still has nightmares.   Screenings:  GAD-7     Video Visit from 02/15/2019 in [facility] Video Visit from 10/11/2018 in [facility] Office Visit from 06/03/2018 in [facility] Office Visit from 04/29/2018 in [facility]  Total GAD-7 Score  19  18  15  16     PHQ2-9     Office Visit [facility] 03/30/2019 in [facility] Video Visit from 02/15/2019 in [facility] Office Visit from 11/24/2018 in [facility] Video Visit from 10/11/2018 in [facility] Office Visit from 06/03/2018 in [facility]  PHQ-2 Total Score  3  5  6  4  6   PHQ-9 Total Score  11  21  23  18  20      Adult ADHD self-report scale (ASRS-v1.1) Symptom Checklist- was conducted on 05/10/19. Based on the checklist, pt meets criteria for ADHD, inattentive type.  Assessment and Plan: 29 year old female with history of MDD, panic attacks, GAD, ADHD, inattentive type, nightmare disorder now seen for f/up.  Patient did not find Lamictal and temazepam to be helpful that were prescribed for mood and sleep symptoms respectively.  She stopped taking them after 7 to 8 days. She asked if she could try a stimulant medication to target her difficulty in focusing as it has been impacting her work.  She was agreeable to a trial of Adderall XR 10 mg every morning.  She was also agreeable to trial of Fetzima after she tries Adderall XR for mood regulation.  She was also advised to try higher dose of prazosin, titration to 6 mg HS, to see if that would help  her nightmares better.  1. MDD (major depressive disorder), recurrent episode, moderate (HCC)  - Levomilnacipran HCl ER (FETZIMA) 20 MG CP24; Take 20 mg by mouth daily.  Dispense: 30 capsule; Refill: 1  2. GAD (generalized anxiety disorder)  - Levomilnacipran HCl ER (FETZIMA) 20 MG CP24; Take 20 mg by mouth daily.  Dispense: 30 capsule; Refill: 1  3. Panic attacks   4. Attention deficit hyperactivity disorder (ADHD), predominantly inattentive type  - amphetamine-dextroamphetamine (ADDERALL XR) 10 MG 24 hr capsule; Take 1 capsule (10 mg total) by mouth every morning.  Dispense: 30 capsule; Refill: 0  5. Nightmare disorder  - Titrate Prazosin to 6 mg HS.    Follow up in 4 weeks. Continue individual therapy.   , MD 06/08/2019, 8:58 AM

## 2019-06-13 ENCOUNTER — Telehealth: Payer: Self-pay

## 2019-06-13 NOTE — Telephone Encounter (Signed)
Received a fax from the [employer] regarding the patient's Fetzima 20mg . It states that the medication is not covered by patient's insurance. It also states that the covered alternatives are: Levomilnacipran, Desvenlafaxine cap, Duloxetine cap, Venlafaxine tab, and Venlafaxine ER cap and tab. Would you like to switch her to one of the alternatives or do a PA on the Fetzima? Please review and advise. Thank you.

## 2019-06-14 NOTE — Telephone Encounter (Signed)
Spoke with pharmacy and they stated that there is not a generic available right now for Fetzima so I'm going to do the PA for patient's Fetzima 20mg  and her Adderall XR 10mg . Thank you.

## 2019-06-14 NOTE — Telephone Encounter (Signed)
Levomilnacipran is the generic name for Fetzima. If the generic is covered then can you contact the pharmacy and tell them to dispense generic medicine to the patient. She has tried and failed other medications. Thanks.

## 2019-06-19 ENCOUNTER — Telehealth (HOSPITAL_COMMUNITY): Payer: Self-pay

## 2019-06-19 NOTE — Telephone Encounter (Signed)
FETZIMA 20MG  APPROVAL - COST TO PATIENT $170.59

## 2019-06-19 NOTE — Telephone Encounter (Signed)
[employer] APPROVED  FETZIMA 20MG  CAPSULE EFFECTIVE 06/15/2019 TO 06/14/2021  APPROVED  AMPHETAMINE-DEXTROAMPHETAMINE 10MG   EFFECTIVE 05/16/2019 TO 06/14/2020

## 2019-06-29 ENCOUNTER — Other Ambulatory Visit: Payer: Self-pay | Admitting: Physician Assistant

## 2019-06-29 DIAGNOSIS — F515 Nightmare disorder: Secondary | ICD-10-CM

## 2019-07-04 ENCOUNTER — Encounter: Payer: Self-pay | Admitting: Psychiatry

## 2019-07-04 ENCOUNTER — Other Ambulatory Visit: Payer: Self-pay

## 2019-07-04 ENCOUNTER — Ambulatory Visit (INDEPENDENT_AMBULATORY_CARE_PROVIDER_SITE_OTHER): Payer: Federal, State, Local not specified - PPO | Admitting: Psychiatry

## 2019-07-04 DIAGNOSIS — F515 Nightmare disorder: Secondary | ICD-10-CM

## 2019-07-04 DIAGNOSIS — F900 Attention-deficit hyperactivity disorder, predominantly inattentive type: Secondary | ICD-10-CM | POA: Diagnosis not present

## 2019-07-04 DIAGNOSIS — F411 Generalized anxiety disorder: Secondary | ICD-10-CM | POA: Diagnosis not present

## 2019-07-04 DIAGNOSIS — F410 Panic disorder [episodic paroxysmal anxiety] without agoraphobia: Secondary | ICD-10-CM | POA: Diagnosis not present

## 2019-07-04 DIAGNOSIS — F331 Major depressive disorder, recurrent, moderate: Secondary | ICD-10-CM

## 2019-07-04 MED ORDER — AMPHETAMINE-DEXTROAMPHET ER 10 MG PO CP24: 10.0000 mg | ORAL_CAPSULE | ORAL | 0 refills | Status: DC

## 2019-07-04 MED ORDER — CLONIDINE HCL 0.1 MG PO TABS: 0.1000 mg | ORAL_TABLET | Freq: Every day | ORAL | 0 refills | Status: DC

## 2019-07-04 NOTE — Progress Notes (Signed)
BH MD OP Progress Note  I connected with  Andrea Baker on 07/04/19 by a video enabled telemedicine application and verified that I am speaking with the correct person using two identifiers.   I discussed the limitations of evaluation and management by telemedicine. The patient expressed understanding and agreed to proceed.    07/04/2019 4:31 PM Andrea Baker  MRN:  [id number]  Chief Complaint: " Adderall has been helpful."  HPI: Patient reported that Adderall XR has been helpful.  It has helped her focus better and improved her productivity at work.  She stated that she feels more focused and as a result she does not worry about what others are thinking about her.  She however has not noticed any change in her sadness levels. She also complained of slight increase in irritability and jaw clenching after starting Adderall XR.  However overall the irritability is manageable so she would like to continue taking it for now.  She did try Fetzima for 2 nights this past weekend.  She reported that after she took it she had to deal with significant nausea and she also noticed worsening of her nightmares.  She discontinued it after 2 doses.  She asked if there was any other option left for her.  Patient was advised to retry Fetzima again for a little bit longer so that we can give it a full try before we switch to a different option. Writer discussed option of esketamine nasal spray Spravato as the next step if she is unable to tolerate Fetzima. Patient was suggested to look it up and do her research.  She was informed that it is administered in specialized clinics only.  Regarding nightmares, she informed that she increase the dose of prazosin to 2 tablets (4 mg) at night.  She has not really noticed a significant difference in her nightmares after increasing the dose.  However she did report feeling more tired in the morning and having to put her head down on the kitchen counter for a few minutes after  getting up. Writer suggested not to increase the dose any further in that case. She asked if there was any other option for nightmares.  Patient was offered the option of trial of clonidine which is also used for nightmares. She was willing to give that a try.    Visit Diagnosis:    ICD-10-CM   1. MDD (major depressive disorder), recurrent episode, moderate (HCC)  F33.1   2. GAD (generalized anxiety disorder)  F41.1   3. Attention deficit hyperactivity disorder (ADHD), predominantly inattentive type  F90.0 amphetamine-dextroamphetamine (ADDERALL XR) 10 MG 24 hr capsule  4. Panic attacks  F41.0   5. 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 Acne   . Anxiety and depression 05/02/2013   Intolerant to lexapro and effexor   . Constipation   . GERD 10/19/2009   Failed omeprazole and protonix.   Marland Kitchen GERD (gastroesophageal reflux disease)   . Mouth ulcer   . Psoriasis    of the scalp  . Wears contact lenses   . Wears glasses     Past Surgical History:  Procedure Laterality Date  . ESOPHAGEAL MANOMETRY N/A 09/12/2018   Procedure: ESOPHAGEAL MANOMETRY (EM);  Surgeon: Napoleon Form, MD;  Location: [facility];  Service: Endoscopy;  Laterality: N/A;  . ESOPHAGOGASTRODUODENOSCOPY  x2 have been normal and gastric emptying has been normal   . ESOPHAGOGASTRODUODENOSCOPY    . gastric empyting study     . PH IMPEDANCE STUDY N/A 09/12/2018   Procedure: PH IMPEDANCE STUDY;  Surgeon: Napoleon Form, MD;  Location: [facility];  Service: Endoscopy;  Laterality: N/A;    Family Psychiatric History: Mother- depression and is currently prescribed Effexor.  Mother also has been diagnosed with ADHD and has tried Vyvanse in the past.  Family History:  Family History  Problem Relation Age of Onset  . Depression Mother   . Heart  attack Father   . Diabetes Father        prediabetic  . Hypertension Father   . Hyperlipidemia Father   . Diabetes Paternal Grandmother   . Colon cancer Neg Hx   . Esophageal cancer Neg Hx   . Rectal cancer Neg Hx   . Stomach cancer Neg Hx     Social History:  Social History   Socioeconomic History  . Marital status: Single    Spouse name: Not on file  . Number of children: Not on file  . Years of education: Not on file  . Highest education level: Not on file  Occupational History  . Not on file  Tobacco Use  . Smoking status: Never Smoker  . Smokeless tobacco: Never Used  Substance and Sexual Activity  . Alcohol use: Yes    Comment: Hard selzer once very 3 months. Cant tolerate alcoloh  . Drug use: No  . Sexual activity: Not Currently  Other Topics Concern  . Not on file  Social History Narrative   LIVES WITH PARENTS.  STUDENT AT [employer]).     Social Determinants of Health   Financial Resource Strain:   . Difficulty of Paying Living Expenses:   Food Insecurity:   . Worried About Programme researcher, broadcasting/film/video in the Last Year:   . Barista in the Last Year:   Transportation Needs:   . Freight forwarder (Medical):   Marland Kitchen Lack of Transportation (Non-Medical):   Physical Activity:   . Days of Exercise per Week:   . Minutes of Exercise per Session:   Stress:   . Feeling of Stress :   Social Connections:   . Frequency of Communication with Friends and Family:   . Frequency of Social Gatherings with Friends and Family:   . Attends Religious Services:   . Active Member of Clubs or Organizations:   . Attends Banker Meetings:   Marland Kitchen Marital Status:     Allergies:  Allergies  Allergen Reactions  . Doxycycline     Nausea and vomiting   . Pristiq [Desvenlafaxine]     Nausea/vomiting/headache  . Augmentin [Amoxicillin-Pot Clavulanate]     Vomiting when taken 10 years ago. Has taken amoxicillin without problems.    Metabolic Disorder Labs: No  results found for: HGBA1C, MPG No results found for: PROLACTIN Lab Results  Component Value Date   CHOL 153 09/29/2013   TRIG 53 09/29/2013   HDL 47 09/29/2013   CHOLHDL 3.3 09/29/2013   VLDL 11 09/29/2013   LDLCALC 95 09/29/2013   Lab Results  Component Value Date   TSH 1.92 04/29/2018    Therapeutic Level Labs: No results found for: LITHIUM No results found for: VALPROATE No components found for:  CBMZ  Current Medications: Current Outpatient Medications  Medication Sig Dispense Refill  . amphetamine-dextroamphetamine (ADDERALL XR) 10 MG 24 hr capsule  Take 1 capsule (10 mg total) by mouth every morning. 30 capsule 0  . clobetasol (OLUX) 0.05 % topical foam Apply topically 2 (two) times daily. 50 g 5  . clonazePAM (KLONOPIN) 0.5 MG tablet clonazepam 0.5 mg tablet    . cloNIDine (CATAPRES) 0.1 MG tablet Take 1 tablet (0.1 mg total) by mouth at bedtime. 30 tablet 0  . drospirenone-ethinyl estradiol (YAZ,GIANVI,LORYNA) 3-0.02 MG tablet Take 1 tablet by mouth daily. 30 tablet 11  . hydrocortisone (ANUSOL-HC) 2.5 % rectal cream Place 1 application rectally 2 (two) times daily. 28 g 2  . Levomilnacipran HCl ER (FETZIMA) 20 MG CP24 Take 20 mg by mouth daily. 30 capsule 1  . Multiple Vitamin (MULTIVITAMIN) capsule Take 1 capsule by mouth daily.    . ondansetron (ZOFRAN) 4 MG tablet Take 4 mg by mouth every 8 (eight) hours as needed for nausea or vomiting.    . Probiotic Product (PROBIOTIC DAILY PO) Take by mouth.    . promethazine (PHENERGAN) 25 MG suppository Place 1 suppository (25 mg total) rectally every 6 (six) hours as needed for nausea or vomiting. 12 each 1  . promethazine (PHENERGAN) 25 MG tablet Take 1 tablet (25 mg total) by mouth every 6 (six) hours as needed for nausea or vomiting. Given only 12 pills 30 tablet 1  . spironolactone (ALDACTONE) 100 MG tablet Take 100 mg by mouth daily.     No current facility-administered medications for this visit.     Musculoskeletal: Strength & Muscle Tone: unable to assess due to telemed visit Gait & Station: unable to assess due to telemed visit Patient leans: unable to assess due to telemed visit  Psychiatric Specialty Exam: Review of Systems    There were no vitals taken for this visit.There is no height or weight on file to calculate BMI.  General Appearance: Well Groomed  Eye Contact:  Good  Speech:  Clear and Coherent and Normal Rate  Volume:  Normal  Mood:  Depressed, "Sad"  Affect:  Congruent  Thought Process:  Goal Directed and Descriptions of Associations: Intact  Orientation:  Full (Time, Place, and Person)  Thought Content: Logical   Suicidal Thoughts:  No  Homicidal Thoughts:  No  Memory:  Immediate;   Good Recent;   Good  Judgement:  Fair  Insight:  Fair  Psychomotor Activity:  Normal  Concentration:  Concentration: Good and Attention Span: Fair  Recall:  Good  Fund of Knowledge: Good  Language: Good  Akathisia:  Negative  Handed:  Right  AIMS (if indicated): not done  Assets:  Communication Skills Desire for Improvement Financial Resources/Insurance Housing  ADL's:  Intact  Cognition: WNL  Sleep:  Fair, still has nightmares.   Screenings:  GAD-7     Video Visit from 02/15/2019 in [facility] Video Visit from 10/11/2018 in [facility] Office Visit from 06/03/2018 in [facility] Office Visit from 04/29/2018 in [facility]  Total GAD-7 Score  19  18  15  16     PHQ2-9     [facility] Visit from 03/30/2019 in [facility] Video Visit from 02[facility]15/2019 in [facility] Office Visit from 11/24/2018 in [facility] Video Visit from 10/11/2018 in [facility] Office Visit from 06/03/2018 in [facility] At [facility]  PHQ-2 Total  Score  3  5  6  4  6   PHQ-9 Total Score  11  21  23  18  20      Adult ADHD self-report scale (ASRS-v1.1) Symptom Checklist- was conducted on 05/10/19. Based on the checklist, pt meets criteria for ADHD, inattentive type.  Assessment and Plan: 29 year old female with history of MDD, panic attacks, GAD, ADHD, inattentive type, nightmare disorder now seen for f/up.  Patient did not find Lamictal and temazepam to be helpful that were prescribed for mood and sleep symptoms respectively.  She stopped taking them after 7 to 8 days. She asked if she could try a stimulant medication to target her difficulty in focusing as it has been impacting her work.  She was agreeable to a trial of Adderall XR 10 mg every morning.  She was also agreeable to trial of Fetzima after she tries Adderall XR for mood regulation.  She was also advised to try higher dose of prazosin, titration to 6 mg HS, to see if that would help her nightmares better.  1. MDD (major depressive disorder), recurrent episode, moderate (HCC)  - Recommended to retry Levomilnacipran HCl ER (FETZIMA) 20 MG CP24; Take 20 mg by mouth daily.  Dispense: 30 capsule; Refill: 1  2. GAD (generalized anxiety disorder)   3. Panic attacks   4. Attention deficit hyperactivity disorder (ADHD), predominantly inattentive type  - Continue amphetamine-dextroamphetamine (ADDERALL XR) 10 MG 24 hr capsule; Take 1 capsule (10 mg total) by mouth every morning.  Dispense: 30 capsule; Refill: 0  5. Nightmare disorder  - Taper off Prazosin, take 1 tablet for 1 week and then stop. - Start Clonidine 0.1 mg HS.    Follow up in 4 weeks. Continue individual therapy.   [place]ev[place] Crane, MD 07/04/2019, 4:31 PM

## 2019-07-19 ENCOUNTER — Encounter: Payer: Self-pay | Admitting: Physician Assistant

## 2019-07-19 DIAGNOSIS — F331 Major depressive disorder, recurrent, moderate: Secondary | ICD-10-CM | POA: Diagnosis not present

## 2019-07-20 ENCOUNTER — Other Ambulatory Visit: Payer: Self-pay | Admitting: Nurse Practitioner

## 2019-07-20 DIAGNOSIS — F419 Anxiety disorder, unspecified: Secondary | ICD-10-CM

## 2019-07-20 DIAGNOSIS — K140 Glossitis: Secondary | ICD-10-CM

## 2019-07-20 DIAGNOSIS — R634 Abnormal weight loss: Secondary | ICD-10-CM

## 2019-07-20 DIAGNOSIS — L309 Dermatitis, unspecified: Secondary | ICD-10-CM

## 2019-07-20 DIAGNOSIS — R5383 Other fatigue: Secondary | ICD-10-CM

## 2019-07-20 DIAGNOSIS — R6889 Other general symptoms and signs: Secondary | ICD-10-CM

## 2019-07-20 DIAGNOSIS — F329 Major depressive disorder, single episode, unspecified: Secondary | ICD-10-CM

## 2019-07-24 DIAGNOSIS — F419 Anxiety disorder, unspecified: Secondary | ICD-10-CM | POA: Diagnosis not present

## 2019-07-24 DIAGNOSIS — R634 Abnormal weight loss: Secondary | ICD-10-CM | POA: Diagnosis not present

## 2019-07-24 DIAGNOSIS — K140 Glossitis: Secondary | ICD-10-CM | POA: Diagnosis not present

## 2019-07-24 DIAGNOSIS — L309 Dermatitis, unspecified: Secondary | ICD-10-CM | POA: Diagnosis not present

## 2019-07-24 DIAGNOSIS — R5383 Other fatigue: Secondary | ICD-10-CM | POA: Diagnosis not present

## 2019-07-24 DIAGNOSIS — F329 Major depressive disorder, single episode, unspecified: Secondary | ICD-10-CM | POA: Diagnosis not present

## 2019-07-25 LAB — CBC WITH DIFFERENTIAL/PLATELET
Absolute Monocytes: 482 cells/uL (ref 200–950)
Basophils Absolute: 32 cells/uL (ref 0–200)
Basophils Relative: 0.6 %
Eosinophils Absolute: 58 cells/uL (ref 15–500)
Eosinophils Relative: 1.1 %
HCT: 41.8 % (ref 35.0–45.0)
Hemoglobin: 13.9 g/dL (ref 11.7–15.5)
Lymphs Abs: 1866 cells/uL (ref 850–3900)
MCH: 31.6 pg (ref 27.0–33.0)
MCHC: 33.3 g/dL (ref 32.0–36.0)
MCV: 95 fL (ref 80.0–100.0)
MPV: 9.9 fL (ref 7.5–12.5)
Monocytes Relative: 9.1 %
Neutro Abs: 2862 cells/uL (ref 1500–7800)
Neutrophils Relative %: 54 %
Platelets: 273 10*3/uL (ref 140–400)
RBC: 4.4 10*6/uL (ref 3.80–5.10)
RDW: 11.3 % (ref 11.0–15.0)
Total Lymphocyte: 35.2 %
WBC: 5.3 10*3/uL (ref 3.8–10.8)

## 2019-07-25 LAB — COMPLETE METABOLIC PANEL WITHOUT GFR
AG Ratio: 1.7 (calc) (ref 1.0–2.5)
ALT: 15 U/L (ref 6–29)
AST: 23 U/L (ref 10–30)
Albumin: 4.3 g/dL (ref 3.6–5.1)
Alkaline phosphatase (APISO): 38 U/L (ref 31–125)
BUN: 10 mg/dL (ref 7–25)
CO2: 26 mmol/L (ref 20–32)
Calcium: 9.3 mg/dL (ref 8.6–10.2)
Chloride: 104 mmol/L (ref 98–110)
Creat: 0.93 mg/dL (ref 0.50–1.10)
GFR, Est African American: 97 mL/min/1.73m2
GFR, Est Non African American: 84 mL/min/1.73m2
Globulin: 2.6 g/dL (ref 1.9–3.7)
Glucose, Bld: 91 mg/dL (ref 65–99)
Potassium: 4.3 mmol/L (ref 3.5–5.3)
Sodium: 137 mmol/L (ref 135–146)
Total Bilirubin: 0.7 mg/dL (ref 0.2–1.2)
Total Protein: 6.9 g/dL (ref 6.1–8.1)

## 2019-07-25 LAB — B12 AND FOLATE PANEL
Folate: 17.5 ng/mL
Vitamin B-12: 503 pg/mL (ref 200–1100)

## 2019-07-25 LAB — TSH+FREE T4: TSH W/REFLEX TO FT4: 2.3 m[IU]/L

## 2019-07-27 DIAGNOSIS — L219 Seborrheic dermatitis, unspecified: Secondary | ICD-10-CM | POA: Diagnosis not present

## 2019-07-27 DIAGNOSIS — L400 Psoriasis vulgaris: Secondary | ICD-10-CM | POA: Diagnosis not present

## 2019-07-27 DIAGNOSIS — L700 Acne vulgaris: Secondary | ICD-10-CM | POA: Diagnosis not present

## 2019-07-31 ENCOUNTER — Telehealth (INDEPENDENT_AMBULATORY_CARE_PROVIDER_SITE_OTHER): Payer: Federal, State, Local not specified - PPO | Admitting: Nurse Practitioner

## 2019-07-31 ENCOUNTER — Encounter: Payer: Self-pay | Admitting: Nurse Practitioner

## 2019-07-31 VITALS — Ht 65.5 in | Wt 109.9 lb

## 2019-07-31 DIAGNOSIS — R5383 Other fatigue: Secondary | ICD-10-CM | POA: Diagnosis not present

## 2019-07-31 DIAGNOSIS — L409 Psoriasis, unspecified: Secondary | ICD-10-CM

## 2019-07-31 DIAGNOSIS — K5904 Chronic idiopathic constipation: Secondary | ICD-10-CM

## 2019-07-31 DIAGNOSIS — R6889 Other general symptoms and signs: Secondary | ICD-10-CM

## 2019-07-31 DIAGNOSIS — R634 Abnormal weight loss: Secondary | ICD-10-CM | POA: Diagnosis not present

## 2019-07-31 DIAGNOSIS — F419 Anxiety disorder, unspecified: Secondary | ICD-10-CM

## 2019-07-31 DIAGNOSIS — F32A Depression, unspecified: Secondary | ICD-10-CM

## 2019-07-31 DIAGNOSIS — F329 Major depressive disorder, single episode, unspecified: Secondary | ICD-10-CM

## 2019-07-31 NOTE — Progress Notes (Addendum)
Virtual Visit via MyChart Note-- CONVERTED TO PHONE VISIT DUE TO TECHNICAL DIFFICULTIES   I connected with  Andrea Baker on 07/31/19 at  4:10 PM EDT by the video enabled telemedicine application, MyChart, and verified that I am speaking with the correct person using two identifiers.   I introduced myself as a Designer, jewellery with the practice. We discussed the limitations of evaluation and management by telemedicine and the availability of in person appointments. The patient expressed understanding and agreed to proceed.  The patient is: at work I am: in the office  Subjective:    CC: Go over labs and discuss symptoms  HPI: Andrea Baker is a 29 y.o. y/o female presenting via [employer] today to follow-up on recent labs and discuss ongoing symptoms.  Lately has been experiencing symptoms of constipation, severe cold intolerance, weight loss, cystic acne, seborrheic dermatitis, mouth ulcers, severe nausea with vomiting episodes, ADHD symptoms, traumatic nightmares, fatigue, depression, anxiety/nervous habits, and skin sensitivity for the majority of her life.  She reports frustration with an inability to determine a cause of her symptoms despite various tests and lab work.  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  One of her biggest concerns at this point is her weight loss.  She reports always being very thin but since approximately 2018 she has recognized inability to gain weight despite increased calorie intake.  As of today she weighs 109.9 pounds and is between 5 foot 5 and 5 foot 6 inches tall.  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  She reports she has been tracking her calorie and protein intake and is consuming more than enough to help her gain weight despite the unknown weight loss.  She has been seen by GI in the past and has had several EGDs  performed which have all been fairly unremarkable.  Gastroenterologist did not diagnosed her with a hypersensitive esophagus and functional dyspepsia, however this is not explain the symptoms of constipation, feelings of incomplete emptying, and repetitive nausea coupled with vomiting episodes to the point of hematemesis.  She reports her constipation has been present since infancy. At this time she has a bowel movement daily, but this requires her to take a daily multi strain probiotic, drink hot tea, and utilize a Physiological scientist. She reports achieving successful evacuation of her bowels takes 45 minutes to an hour every day. She reports she never feels that she achieves complete emptying of her bowel and must strain and sit for long periods to have a bowel movement. This has exacerbated hemorrhoids and she reports frequent bleeding from her hemorrhoids with bowel movements. Her BM's are typically a 5-6 on the [facility] Stool Scale and she feels they have a "powdery" consistency. She has tried Linzess in the past and this was not well tolerated.   She has daily nausea and frequent episodes of vomiting that result in hematemesis. 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 She has had a past gastric emptying study, which showed normal gastric emptying times.   She is followed for her depression, anxiety, and ADHD by Dr. Toy Care.    Past medical history, Surgical history, Family history not pertinant except as noted below, Social history, Allergies, and medications have been entered into the medical record, reviewed, and corrections made.   Review of Systems:  Denies: joint  pain, joint swelling, diarrhea, lymphadenopathy, severe fatigue, worsening symptoms with certain foods or beverages, malar rash, edema, chest pain, fever, or dyspnea.   Objective:    General: Speaking clearly in complete sentences without any shortness of breath.  Alert and  oriented x3.  Normal judgment. No apparent acute distress.   Impression and Recommendations:   1. Unexplained weight loss 2. Intolerance to cold 3. Chronic idiopathic constipation 4. Fatigue, unspecified type 5. Anxiety and depression 6. Psoriasis  Multiple symptoms with no clear etiology represented with work-ups performed thus far. At this time it is not evident if her symptoms are related or independent of each other. It is evident her symptoms are affecting her emotional wellbeing and this is concerning from the mental health standpoint.  Most recent labs unremarkable for abnormality include CBC, TSH, and CMP.  Plan to perform ACTH 3 point testing for adrenal insufficiency.     I discussed the assessment and treatment plan with the patient. The patient was provided an opportunity to ask questions and all were answered. The patient agreed with the plan and demonstrated an understanding of the instructions.   The patient was advised to call back or seek an in-person evaluation if the symptoms worsen or if the condition fails to improve as anticipated.  I provided 22 minutes of non-face-to-face interaction with this MYCHART visit.   Tollie Eth, NP

## 2019-08-01 ENCOUNTER — Encounter: Payer: Self-pay | Admitting: Nurse Practitioner

## 2019-08-01 ENCOUNTER — Other Ambulatory Visit: Payer: Self-pay | Admitting: Nurse Practitioner

## 2019-08-01 DIAGNOSIS — F419 Anxiety disorder, unspecified: Secondary | ICD-10-CM

## 2019-08-01 DIAGNOSIS — R5383 Other fatigue: Secondary | ICD-10-CM

## 2019-08-01 DIAGNOSIS — F32A Depression, unspecified: Secondary | ICD-10-CM

## 2019-08-01 DIAGNOSIS — K5904 Chronic idiopathic constipation: Secondary | ICD-10-CM

## 2019-08-01 DIAGNOSIS — R6889 Other general symptoms and signs: Secondary | ICD-10-CM

## 2019-08-01 DIAGNOSIS — R634 Abnormal weight loss: Secondary | ICD-10-CM

## 2019-08-01 DIAGNOSIS — F329 Major depressive disorder, single episode, unspecified: Secondary | ICD-10-CM

## 2019-08-02 ENCOUNTER — Encounter: Payer: Self-pay | Admitting: Psychiatry

## 2019-08-02 ENCOUNTER — Telehealth (INDEPENDENT_AMBULATORY_CARE_PROVIDER_SITE_OTHER): Payer: Federal, State, Local not specified - PPO | Admitting: Psychiatry

## 2019-08-02 ENCOUNTER — Other Ambulatory Visit: Payer: Self-pay

## 2019-08-02 DIAGNOSIS — F900 Attention-deficit hyperactivity disorder, predominantly inattentive type: Secondary | ICD-10-CM | POA: Diagnosis not present

## 2019-08-02 DIAGNOSIS — F515 Nightmare disorder: Secondary | ICD-10-CM

## 2019-08-02 DIAGNOSIS — F331 Major depressive disorder, recurrent, moderate: Secondary | ICD-10-CM

## 2019-08-02 DIAGNOSIS — F411 Generalized anxiety disorder: Secondary | ICD-10-CM | POA: Diagnosis not present

## 2019-08-02 DIAGNOSIS — F410 Panic disorder [episodic paroxysmal anxiety] without agoraphobia: Secondary | ICD-10-CM

## 2019-08-02 MED ORDER — AMPHETAMINE-DEXTROAMPHET ER 10 MG PO CP24: 10.0000 mg | ORAL_CAPSULE | ORAL | 0 refills | Status: DC

## 2019-08-02 NOTE — Progress Notes (Signed)
[place] MD OP Progress Note  I connected with  Andrea Baker on 08/02/19 by a video enabled telemedicine application and verified that I am speaking with the correct person using two identifiers.   I discussed the limitations of evaluation and management by telemedicine. The patient expressed understanding and agreed to proceed.    08/02/2019 3:43 PM Andrea Baker  MRN:  [id number]  Chief Complaint: " I have been feeling very nauseous the whole day since morning.  I have also had headache the whole day today.  HPI: Patient reported that she tried a few days without prazosin after tapering it off gradually.  She stated that when she was off of it she noticed she had more vivid dreams and nightmares.  She stated that after being off of it for a few weeks she decided to restart it last night.  She informed that last night she had a series of nightmares.  She described the nightmares from last night in great detail. She informed that once she was off of the prazosin she did try clonidine only for a couple of nights however did not find it to be helpful.  She stated that when she took it for the first time at night she cried the next morning and felt more sad than usual.  She stated that she spent a few hours sitting in the car with a heat on as that felt more comforting than anything else.   She informed that after getting a lot of feedback from everyone she did try CBD after investigating if it showed up in urine toxicology screen.  After she found an optimal formulation she tried it however it did not make a difference in her nightmares at all.  So she is no longer taking it. She reported that her nausea is ongoing.  She has not had follow-up with her GI specialist in a while and she is planning to contact the office to reschedule an appointment. She did some research on [employer] and asked a few questions to clarify a few things.  She has not really made up her mind about it as of yet.  Based on all her  symptoms, writer recommended that she touches base with her GI doctor.  She was advised to continue the Adderall as she has found to be beneficial for now.  She was also advised to continue prazosin 2 mg at bedtime.   Visit Diagnosis:    ICD-10-CM   1. MDD (major depressive disorder), recurrent episode, moderate (HCC)  F33.1   2. GAD (generalized anxiety disorder)  F41.1   3. Attention deficit hyperactivity disorder (ADHD), predominantly inattentive type  F90.0   4. 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 Panic attacks  F41.0     Past Psychiatric History: MDD, GAD, Panic attacks, nightmares, Insomnia  Past Medical History:  Past Medical History:  Diagnosis Date  . Acne   . Anxiety and depression 05/02/2013   Intolerant to lexapro and effexor   . Constipation   . GERD 10/19/2009   Failed omeprazole and protonix.   Marland Kitchen GERD (gastroesophageal reflux disease)   . Mouth ulcer   . Psoriasis    of the scalp  . Wears contact lenses   . Wears glasses     Past Surgical History:  Procedure Laterality Date  . ESOPHAGEAL MANOMETRY N/A 09/12/2018   Procedure: ESOPHAGEAL MANOMETRY (EM);  Surgeon: Mauri Pole, MD;  Location: [facility];  Service: Endoscopy;  Laterality: N/A;  . ESOPHAGOGASTRODUODENOSCOPY  x2 have been normal and gastric emptying has been normal   . ESOPHAGOGASTRODUODENOSCOPY    . gastric empyting study     . PH IMPEDANCE STUDY N/A 09/12/2018   Procedure: PH IMPEDANCE STUDY;  Surgeon: Napoleon Form, MD;  [facility];  Service: Endoscopy;  Laterality: N/A;    Family Psychiatric History: Mother- depression and is currently prescribed Effexor.  Mother also has been diagnosed with ADHD and has tried Vyvanse in the past.  Family History:  Family History  Problem Relation Age of Onset  . Depression Mother   . Heart attack Father   . Diabetes Father        prediabetic  . Hypertension Father   . Hyperlipidemia Father   . Diabetes Paternal Grandmother   .  Colon cancer Neg Hx   . Esophageal cancer Neg Hx   . Rectal cancer Neg Hx   . Stomach cancer Neg Hx     Social History:  Social History   Socioeconomic History  . Marital status: Single    Spouse name: Not on file  . Number of children: Not on file  . Years of education: Not on file  . Highest education level: Not on file  Occupational History  . Not on file  Tobacco Use  . Smoking status: Never Smoker  . Smokeless tobacco: Never Used  Substance and Sexual Activity  . Alcohol use: Yes    Comment: Hard selzer once very 3 months. Cant tolerate alcoloh  . Drug use: No  . Sexual activity: Not Currently  Other Topics Concern  . Not on file  Social History Narrative   LIVES WITH PARENTS.  STUDENT AT [employer]).     Social Determinants of Health   Financial Resource Strain:   . Difficulty of Paying Living Expenses:   Food Insecurity:   . Worried About Programme researcher, broadcasting/film/video in the Last Year:   . Barista in the Last Year:   Transportation Needs:   . Freight forwarder (Medical):   Marland Kitchen Lack of Transportation (Non-Medical):   Physical Activity:   . Days of Exercise per Week:   . Minutes of Exercise per Session:   Stress:   . Feeling of Stress :   Social Connections:   . Frequency of Communication with Friends and Family:   . Frequency of Social Gatherings with Friends and Family:   . Attends Religious Services:   . Active Member of Clubs or Organizations:   . Attends Banker Meetings:   Marland Kitchen Marital Status:     Allergies:  Allergies  Allergen Reactions  . Doxycycline     Nausea and vomiting   . Pristiq [Desvenlafaxine]     Nausea/vomiting/headache  . Augmentin [Amoxicillin-Pot Clavulanate]     Vomiting when taken 10 years ago. Has taken amoxicillin without problems.    Metabolic Disorder Labs: No results found for: HGBA1C, MPG No results found for: PROLACTIN Lab Results  Component Value Date   CHOL 153 09/29/2013   TRIG 53 09/29/2013    HDL 47 09/29/2013   CHOLHDL 3.3 09/29/2013   VLDL 11 09/29/2013   LDLCALC 95 09/29/2013   Lab Results  Component Value Date   TSH 1.92 04/29/2018    Therapeutic Level Labs: No results found for: LITHIUM No results found for: VALPROATE No components found for:  CBMZ  Current Medications: Current Outpatient Medications  Medication Sig Dispense Refill  . amphetamine-dextroamphetamine (ADDERALL XR) 10 MG 24 hr capsule  Take 1 capsule (10 mg total) by mouth every morning. 30 capsule 0  . clobetasol (OLUX) 0.05 % topical foam Apply topically 2 (two) times daily. 50 g 5  . clonazePAM (KLONOPIN) 0.5 MG tablet clonazepam 0.5 mg tablet    . cloNIDine (CATAPRES) 0.1 MG tablet Take 1 tablet (0.1 mg total) by mouth at bedtime. 30 tablet 0  . drospirenone-ethinyl estradiol (YAZ,GIANVI,LORYNA) 3-0.02 MG tablet Take 1 tablet by mouth daily. 30 tablet 11  . hydrocortisone (ANUSOL-HC) 2.5 % rectal cream Place 1 application rectally 2 (two) times daily. 28 g 2  . hydrocortisone 2.5 % cream     . Levomilnacipran HCl ER (FETZIMA) 20 MG CP24 Take 20 mg by mouth daily. 30 capsule 1  . Multiple Vitamin (MULTIVITAMIN) capsule Take 1 capsule by mouth daily.    . ondansetron (ZOFRAN) 4 MG tablet Take 4 mg by mouth every 8 (eight) hours as needed for nausea or vomiting.    . Probiotic Product (PROBIOTIC DAILY PO) Take by mouth.    . promethazine (PHENERGAN) 25 MG suppository Place 1 suppository (25 mg total) rectally every 6 (six) hours as needed for nausea or vomiting. 12 each 1  . promethazine (PHENERGAN) 25 MG tablet Take 1 tablet (25 mg total) by mouth every 6 (six) hours as needed for nausea or vomiting. Given only 12 pills 30 tablet 1  . spironolactone (ALDACTONE) 100 MG tablet Take 100 mg by mouth daily.     No current facility-administered medications for this visit.    Musculoskeletal: Strength & Muscle Tone: unable to assess due to telemed visit Gait & Station: unable to assess due to telemed  visit Patient leans: unable to assess due to telemed visit  Psychiatric Specialty Exam: Review of Systems    There were no vitals taken for this visit.There is no height or weight on file to calculate BMI.  General Appearance: Well Groomed  Eye Contact:  Good  Speech:  Clear and Coherent and Normal Rate  Volume:  Normal  Mood:  Depressed  Affect:  Congruent  Thought Process:  Goal Directed and Descriptions of Associations: Intact  Orientation:  Full (Time, Place, and Person)  Thought Content: Logical   Suicidal Thoughts:  No  Homicidal Thoughts:  No  Memory:  Immediate;   Good Recent;   Good  Judgement:  Fair  Insight:  Fair  Psychomotor Activity:  Normal  Concentration:  Concentration: Good and Attention Span: Fair  Recall:  Good  Fund of Knowledge: Good  Language: Good  Akathisia:  Negative  Handed:  Right  AIMS (if indicated): not done  Assets:  Communication Skills Desire for Improvement Financial Resources/Insurance Housing  ADL's:  Intact  Cognition: WNL  Sleep:  Fair, still has nightmares.   Screenings:  GAD-7     Video Visit from 02/15/2019 in [facility] At [facility] Video Visit from 10/11/2018 in [facility] At [facility] Office Visit from 06/03/2018 in [facility] At [facility] Office Visit from 04/29/2018 in [facility]  Total GAD-7 Score  19  18  15  16     PHQ2-9     [facility] Visit from 03[facility]30/2019 [facility] Video Visit from 02/15/2019 in [facility] At [facility] Office Visit from 11/24/2018 in [facility] Video Visit from 10/11/2018 in [facility] At [facility] Office Visit from 06/03/2018 in [facility]  Care At [facility]  PHQ-2 Total Score  3  5  6  4  6   PHQ-9 Total Score  11  21  23  18  20      Adult ADHD self-report  scale (ASRS-v1.1) Symptom Checklist- was conducted on 05/10/19. Based on the checklist, pt meets criteria for ADHD, inattentive type.  Assessment and Plan: 29 year old female with history of MDD, panic attacks, GAD, ADHD, inattentive type, nightmare disorder now seen for f/up.  Patient continues to endorse several GI symptoms.  She was receptive to the recommendation of seeing her GI specialist for follow-up.  She was recommended to continue prazosin that she just restarted last night to help with nightmares as it was effective when she was taking it consistently.  She was advised to continue taking Adderall XR at the same dose for now.  She was advised to touch base in 2 months.   1. MDD (major depressive disorder), recurrent episode, moderate (HCC)   2. GAD (generalized anxiety disorder)   3. Panic attacks   4. Attention deficit hyperactivity disorder (ADHD), predominantly inattentive type  - Continue amphetamine-dextroamphetamine (ADDERALL XR) 10 MG 24 hr capsule; Take 1 capsule (10 mg total) by mouth every morning.  Dispense: 30 capsule; Refill: 0  5. Nightmare disorder  -Restart prazosin 2 mg at bedtime.  Follow up in 8 weeks. Continue individual therapy.   05/12/19, MD 08/02/2019, 3:43 PM

## 2019-08-02 NOTE — Telephone Encounter (Signed)
Lab requisition mailed to patient.  

## 2019-08-07 DIAGNOSIS — F331 Major depressive disorder, recurrent, moderate: Secondary | ICD-10-CM | POA: Diagnosis not present

## 2019-08-16 ENCOUNTER — Other Ambulatory Visit: Payer: Self-pay | Admitting: Nurse Practitioner

## 2019-08-16 ENCOUNTER — Encounter: Payer: Self-pay | Admitting: Nurse Practitioner

## 2019-08-16 DIAGNOSIS — K5904 Chronic idiopathic constipation: Secondary | ICD-10-CM | POA: Diagnosis not present

## 2019-08-16 DIAGNOSIS — F419 Anxiety disorder, unspecified: Secondary | ICD-10-CM | POA: Diagnosis not present

## 2019-08-16 DIAGNOSIS — R112 Nausea with vomiting, unspecified: Secondary | ICD-10-CM

## 2019-08-16 DIAGNOSIS — R6889 Other general symptoms and signs: Secondary | ICD-10-CM | POA: Diagnosis not present

## 2019-08-16 DIAGNOSIS — R634 Abnormal weight loss: Secondary | ICD-10-CM | POA: Diagnosis not present

## 2019-08-16 DIAGNOSIS — R5383 Other fatigue: Secondary | ICD-10-CM | POA: Diagnosis not present

## 2019-08-16 MED ORDER — ONDANSETRON 4 MG PO TBDP: 4.0000 mg | ORAL_TABLET | Freq: Three times a day (TID) | ORAL | 5 refills | Status: DC | PRN

## 2019-08-18 ENCOUNTER — Telehealth: Payer: Self-pay

## 2019-08-18 LAB — SEDIMENTATION RATE: Sed Rate: 5 mm/hr (ref 0–32)

## 2019-08-18 LAB — ANA: ANA Titer 1: NEGATIVE

## 2019-08-18 NOTE — Progress Notes (Signed)
ANA and sedimentation rate were both normal. These would tell us if there was an indication of an inflammatory response in the body that could indicate an autoimmune disorder. I am still waiting on a response about the ACTH test and will be in touch once I have some answers from [facility].

## 2019-08-18 NOTE — Telephone Encounter (Signed)
Pt called wanting to know if Les Pou had heard anything back from [facility] regarding the ACTH testing blood draw. (see portal message dated 08/16/2019)

## 2019-08-26 LAB — ACTH STIMULATION, 3 TIME POINTS: Cortisol #1 (Base): 31.3 ug/dL

## 2019-08-28 DIAGNOSIS — F331 Major depressive disorder, recurrent, moderate: Secondary | ICD-10-CM | POA: Diagnosis not present

## 2019-08-28 NOTE — Telephone Encounter (Signed)
Les Pou Early, DNP, AGNP-C sent a portal message to the pt regarding these lab results.

## 2019-08-28 NOTE — Progress Notes (Signed)
AM cortisol was slightly increased. They did not perform the entire test.   The elevated AM cortisol is not super elevated so it is likely caused by increased stress, anxiety, or depression. It could also be elevated due to the time in your menstrual cycle.   The elevated reading does not really coordinate with your symptoms, so I am not sure that we need to re-do the test, but I will collaborate with my colleagues and see if they have any recommendations.

## 2019-08-30 ENCOUNTER — Other Ambulatory Visit: Payer: Self-pay | Admitting: Nurse Practitioner

## 2019-09-11 DIAGNOSIS — L400 Psoriasis vulgaris: Secondary | ICD-10-CM | POA: Diagnosis not present

## 2019-09-11 DIAGNOSIS — L219 Seborrheic dermatitis, unspecified: Secondary | ICD-10-CM | POA: Diagnosis not present

## 2019-09-12 DIAGNOSIS — Z20822 Contact with and (suspected) exposure to covid-19: Secondary | ICD-10-CM | POA: Diagnosis not present

## 2019-09-12 DIAGNOSIS — J069 Acute upper respiratory infection, unspecified: Secondary | ICD-10-CM | POA: Diagnosis not present

## 2019-09-20 ENCOUNTER — Encounter: Payer: Self-pay | Admitting: Nurse Practitioner

## 2019-09-20 DIAGNOSIS — F331 Major depressive disorder, recurrent, moderate: Secondary | ICD-10-CM | POA: Diagnosis not present

## 2019-10-03 ENCOUNTER — Telehealth: Payer: Federal, State, Local not specified - PPO | Admitting: Psychiatry

## 2019-10-03 ENCOUNTER — Encounter (HOSPITAL_COMMUNITY): Payer: Self-pay | Admitting: Psychiatry

## 2019-10-03 ENCOUNTER — Telehealth (INDEPENDENT_AMBULATORY_CARE_PROVIDER_SITE_OTHER): Payer: Federal, State, Local not specified - PPO | Admitting: Psychiatry

## 2019-10-03 ENCOUNTER — Other Ambulatory Visit: Payer: Self-pay

## 2019-10-03 DIAGNOSIS — F410 Panic disorder [episodic paroxysmal anxiety] without agoraphobia: Secondary | ICD-10-CM

## 2019-10-03 DIAGNOSIS — F411 Generalized anxiety disorder: Secondary | ICD-10-CM

## 2019-10-03 DIAGNOSIS — F331 Major depressive disorder, recurrent, moderate: Secondary | ICD-10-CM | POA: Diagnosis not present

## 2019-10-03 DIAGNOSIS — F900 Attention-deficit hyperactivity disorder, predominantly inattentive type: Secondary | ICD-10-CM | POA: Diagnosis not present

## 2019-10-03 DIAGNOSIS — F515 Nightmare disorder: Secondary | ICD-10-CM

## 2019-10-03 MED ORDER — AMPHETAMINE-DEXTROAMPHET ER 15 MG PO CP24: 15.0000 mg | ORAL_CAPSULE | ORAL | 0 refills | Status: DC

## 2019-10-03 MED ORDER — AMPHETAMINE-DEXTROAMPHET ER 20 MG PO CP24: 20.0000 mg | ORAL_CAPSULE | Freq: Every morning | ORAL | 0 refills | Status: DC

## 2019-10-03 NOTE — Progress Notes (Addendum)
[facility] OP Progress Note Virtual Visit via Video Note  I connected with Andrea Baker on 10/03/19 at  4:00 PM EDT by a video enabled telemedicine application and verified that I am speaking with the correct person using two identifiers.  Location: Patient: Home Provider: Clinic   I discussed the limitations of evaluation and management by telemedicine and the availability of in person appointments. The patient expressed understanding and agreed to proceed.  I provided 25 minutes of non-face-to-face time during this encounter.      10/03/2019 4:28 PM Andrea Baker  MRN:  [id number]  Chief Complaint: " Nothing has changed much."  HPI: Patient informed that nothing is changed much since our last visit.  She informed that as time progressed she has noticed that Adderall is not as effective as it used to be.  She informed that she started to make some careless mistakes and lose her focus as the day progresses.  She got into trouble at work due to some carelessness. She informed that lately she has noticed increase in her traumatic nightmares and vivid dreams.  She is not sure if there is any direct trigger.  She describes some of her nightmares and vivid dreams in great detail.  She stated that on a few occasions she has woken up and then checked her vicinity for the things that she saw her in her dreams and nightmares. She is scheduled to see the GI specialist in a few weeks from now and has already made a long list of questions that she needs to get clarification on.  She stated that she is on the fence about discontinuing prazosin as she knows that in the past whenever she is try to discontinue it her nightmares worsened.  She was agreeable to increasing the dose of Adderall for optimal effect.  She stated that she recalls having jaw clenching for the first few weeks when she started taking Adderall.  She was concerned if that may be an issue once she increased the dose.  Writer recommended that  we increase the dose gradually and can increase the dose to 15 mg for 1 month and then go on to the 20 mg dose the following month.  Patient was agreeable to this.  Visit Diagnosis:    ICD-10-CM   1. MDD (major depressive disorder), recurrent episode, moderate (HCC)  F33.1   2. GAD (generalized anxiety disorder)  F41.1   3. Panic attacks  F41.0   4. 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 Acne   . Anxiety and depression 05/02/2013   Intolerant to lexapro and effexor   . Constipation   . GERD 10/19/2009   Failed omeprazole and protonix.   Marland Kitchen GERD (gastroesophageal reflux disease)   . Mouth ulcer   . Psoriasis    of the scalp  . Wears contact lenses   . Wears glasses     Past Surgical History:  Procedure Laterality Date  . ESOPHAGEAL MANOMETRY N/A 09/12/2018   Procedure: ESOPHAGEAL MANOMETRY (EM);  Surgeon: Napoleon Form, MD;  [facility];  Service: Endoscopy;  Laterality: N/A;  . ESOPHAGOGASTRODUODENOSCOPY     x2 have been normal and gastric emptying has been normal   . ESOPHAGOGASTRODUODENOSCOPY    . gastric empyting study     .  PH IMPEDANCE STUDY N/A 09/12/2018   Procedure: PH IMPEDANCE STUDY;  Surgeon: Napoleon FormNandigam, Kavitha V, MD;  Location: [facility];  Service: Endoscopy;  Laterality: N/A;    Family Psychiatric History: Mother- depression and is currently prescribed Effexor.  Mother also has been diagnosed with ADHD and has tried Vyvanse in the past.  Family History:  Family History  Problem Relation Age of Onset  . Depression Mother   . Heart attack Father   . Diabetes Father        prediabetic  . Hypertension Father   . Hyperlipidemia Father   . Diabetes Paternal Grandmother   . Colon cancer Neg Hx   . Esophageal cancer Neg Hx   .  Rectal cancer Neg Hx   . Stomach cancer Neg Hx     Social History:  Social History   Socioeconomic History  . Marital status: Single    Spouse name: Not on file  . Number of children: Not on file  . Years of education: Not on file  . Highest education level: Not on file  Occupational History  . Not on file  Tobacco Use  . Smoking status: Never Smoker  . Smokeless tobacco: Never Used  Vaping Use  . Vaping Use: Never used  Substance and Sexual Activity  . Alcohol use: Yes    Comment: Hard selzer once very 3 months. Cant tolerate alcoloh  . Drug use: No  . Sexual activity: Not Currently  Other Topics Concern  . Not on file  Social History Narrative   LIVES WITH PARENTS.  STUDENT AT [employer]).     Social Determinants of Health   Financial Resource Strain:   . Difficulty of Paying Living Expenses:   Food Insecurity:   . Worried About Programme researcher, broadcasting/film/videounning Out of Food in the Last Year:   . Baristaan Out of Food in the Last Year:   Transportation Needs:   . Freight forwarderLack of Transportation (Medical):   Marland Kitchen. Lack of Transportation (Non-Medical):   Physical Activity:   . Days of Exercise per Week:   . Minutes of Exercise per Session:   Stress:   . Feeling of Stress :   Social Connections:   . Frequency of Communication with Friends and Family:   . Frequency of Social Gatherings with Friends and Family:   . Attends Religious Services:   . Active Member of Clubs or Organizations:   . Attends BankerClub or Organization Meetings:   Marland Kitchen. Marital Status:     Allergies:  Allergies  Allergen Reactions  . Doxycycline     Nausea and vomiting   . Pristiq [Desvenlafaxine]     Nausea/vomiting/headache  . Augmentin [Amoxicillin-Pot Clavulanate]     Vomiting when taken 10 years ago. Has taken amoxicillin without problems.    Metabolic Disorder Labs: No results found for: HGBA1C, MPG No results found for: PROLACTIN Lab Results  Component Value Date   CHOL 153 09/29/2013   TRIG 53 09/29/2013   HDL 47  09/29/2013   CHOLHDL 3.3 09/29/2013   VLDL 11 09/29/2013   LDLCALC 95 09/29/2013   Lab Results  Component Value Date   TSH 1.92 04/29/2018    Therapeutic Level Labs: No results found for: LITHIUM No results found for: VALPROATE No components found for:  CBMZ  Current Medications: Current Outpatient Medications  Medication Sig Dispense Refill  . amphetamine-dextroamphetamine (ADDERALL XR) 10 MG 24 hr capsule Take 1 capsule (10 mg total) by mouth every morning. 30 capsule 0  . amphetamine-dextroamphetamine (ADDERALL  XR) 10 MG 24 hr capsule Take 1 capsule (10 mg total) by mouth every morning. 30 capsule 0  . clobetasol (OLUX) 0.05 % topical foam Apply topically 2 (two) times daily. 50 g 5  . clonazePAM (KLONOPIN) 0.5 MG tablet clonazepam 0.5 mg tablet    . cloNIDine (CATAPRES) 0.1 MG tablet Take 1 tablet (0.1 mg total) by mouth at bedtime. 30 tablet 0  . drospirenone-ethinyl estradiol (YAZ,GIANVI,LORYNA) 3-0.02 MG tablet Take 1 tablet by mouth daily. 30 tablet 11  . hydrocortisone (ANUSOL-HC) 2.5 % rectal cream Place 1 application rectally 2 (two) times daily. 28 g 2  . hydrocortisone 2.5 % cream     . Levomilnacipran HCl ER (FETZIMA) 20 MG CP24 Take 20 mg by mouth daily. 30 capsule 1  . Multiple Vitamin (MULTIVITAMIN) capsule Take 1 capsule by mouth daily.    . ondansetron (ZOFRAN-ODT) 4 MG disintegrating tablet Take 1 tablet (4 mg total) by mouth every 8 (eight) hours as needed for nausea or vomiting. 30 tablet 5  . Probiotic Product (PROBIOTIC DAILY PO) Take by mouth.    . promethazine (PHENERGAN) 25 MG suppository Place 1 suppository (25 mg total) rectally every 6 (six) hours as needed for nausea or vomiting. 12 each 1  . promethazine (PHENERGAN) 25 MG tablet Take 1 tablet (25 mg total) by mouth every 6 (six) hours as needed for nausea or vomiting. Given only 12 pills 30 tablet 1  . spironolactone (ALDACTONE) 100 MG tablet Take 100 mg by mouth daily.     No current  facility-administered medications for this visit.    Musculoskeletal: Strength & Muscle Tone: unable to assess due to telemed visit Gait & Station: unable to assess due to telemed visit Patient leans: unable to assess due to telemed visit  Psychiatric Specialty Exam: Review of Systems    There were no vitals taken for this visit.There is no height or weight on file to calculate BMI.  General Appearance: Well Groomed  Eye Contact:  Good  Speech:  Clear and Coherent and Normal Rate  Volume:  Normal  Mood:  Depressed  Affect:  Congruent  Thought Process:  Goal Directed and Descriptions of Associations: Intact  Orientation:  Full (Time, Place, and Person)  Thought Content: Logical   Suicidal Thoughts:  No  Homicidal Thoughts:  No  Memory:  Immediate;   Good Recent;   Good  Judgement:  Fair  Insight:  Fair  Psychomotor Activity:  Normal  Concentration:  Concentration: Good and Attention Span: Fair  Recall:  Good  Fund of Knowledge: Good  Language: Good  Akathisia:  Negative  Handed:  Right  AIMS (if indicated): not done  Assets:  Communication Skills Desire for Improvement Financial Resources/Insurance Housing  ADL's:  Intact  Cognition: WNL  Sleep:  Fair, still has nightmares and vivid dreams   Screenings:  GAD-7     Video Visit from 02/15/2019 in [facility] At [facility] Video Visit from 10/11/2018 in [facility] At [facility] Office Visit from 06/03/2018 in [facility] At [facility] Office Visit from 04/29/2018 in [facility]  Total GAD-7 Score 19 18 15 16     PHQ2-9     Office Visit from 03/30/2019 in [facility] Video Visit from 02/15/2019 in [facility] Video Visit from 10/11/2018 in [facility] At  [facility] Office Visit from 06/03/2018 in [facility] At [facility]  PHQ-2 Total Score 3 5 6 4 6   PHQ-9 Total Score 11 21 23 18 20      Adult ADHD self-report scale (ASRS-v1.1) Symptom Checklist- was conducted on 05/10/19. Based on the checklist, pt meets criteria for ADHD, inattentive type.  Assessment and Plan:    1. MDD (major depressive disorder), recurrent episode, moderate (HCC) - Has tried several different anti-depressants and mood stabilizers, but has not responded well and has developed adverse effects.  2. GAD (generalized anxiety disorder)   3. Panic attacks   4. Attention deficit hyperactivity disorder (ADHD), predominantly inattentive type - Increase amphetamine-dextroamphetamine (ADDERALL XR) 15 MG 24 hr capsule; Take 1 capsule by mouth every morning.  Dispense: 30 capsule; Refill: 0 for 1 month  - Then again Increase amphetamine-dextroamphetamine (ADDERALL XR) 20 MG 24 hr capsule; Take 1 capsule (20 mg total) by mouth in the morning.  Dispense: 30 capsule; Refill: 0  5. Nightmare disorder -prazosin 2 mg at bedtime.  Follow up in 8 weeks. Continue individual therapy.   , MD 10/03/2019, 4:28 PM

## 2019-10-04 DIAGNOSIS — F331 Major depressive disorder, recurrent, moderate: Secondary | ICD-10-CM | POA: Diagnosis not present

## 2019-10-10 ENCOUNTER — Ambulatory Visit (INDEPENDENT_AMBULATORY_CARE_PROVIDER_SITE_OTHER): Payer: Federal, State, Local not specified - PPO | Admitting: Gastroenterology

## 2019-10-10 ENCOUNTER — Encounter: Payer: Self-pay | Admitting: Gastroenterology

## 2019-10-10 VITALS — BP 110/76 | HR 92 | Ht 66.0 in | Wt 109.5 lb

## 2019-10-10 DIAGNOSIS — K229 Disease of esophagus, unspecified: Secondary | ICD-10-CM | POA: Diagnosis not present

## 2019-10-10 DIAGNOSIS — R110 Nausea: Secondary | ICD-10-CM | POA: Diagnosis not present

## 2019-10-10 DIAGNOSIS — K2289 Other specified disease of esophagus: Secondary | ICD-10-CM

## 2019-10-10 DIAGNOSIS — R1013 Epigastric pain: Secondary | ICD-10-CM | POA: Diagnosis not present

## 2019-10-10 DIAGNOSIS — K5900 Constipation, unspecified: Secondary | ICD-10-CM

## 2019-10-10 NOTE — Progress Notes (Signed)
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   Index sxs of dyspepsia, nausea andvisibleabdominal distension. No HB, regurgitation, sour brashor belch. Only relief is with self-inducedemesis. Emesis contents typically bilious yellow/orange. Emesis always self-induced because that will relieve symptoms the fastest (ie, no spontaneous emesis). Sxs can be middle of the night, waking from sleep, or postprandial. Postprandial symptoms typically 1 hour after ingestion, but can be within minutes. Worse with tomato based foods, spicy, fatty/fried, EtOH.No dysphagia or odynophagia. No abdominal pain.  She has trialed multiple acid suppression agents,to include Protonix, Dexilant, Zegerid, Nexium, and now Prilosec. Extensive eval as below with dx of Esophageal Hypersensitivity, and subesquently discontinued PPI without appreciable worsening of sxs.   2) Nausea: As above, tends to be post prandial, and only relief is with self-induced emesis. Some improvement with Zofran which she takes prn. Has had epsiodes of blood streaked emesis with more forceful events.   3) Weight loss: Weight now stable, but unable to gain back to baseline (120-122#) despite high calorie intake.   4) Chronic constipation: Longstanding history of constipation since infancy. All unchanged. Some variable response to dietary modifications.   GI evaluation history: -EGD (01/2010, Dr. Brooke Dare at [facility]): Normal -EGD (09/01/2013, [facility]): Duodenal nodule (lymphangiectasia with benign biopsy), otherwise normal duodenum with biopsies negative for Celiac  Disease. Otherwise normal -RUQ ultrasound (09/2015): Normal -GES (10/2015): Normal - EGD (05/2018, Dr. 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  Otherwise normal with normal peristalsis. -pH/impedance (08/2018): Normal.  Good symptom correlation with nausea and belch, but overall low number of events.  Consistent with hypersensitive esophagus. -HIDA scan (10/2018): Normal  HPI:     Patient is a 29 y.o. female presenting to the Gastroenterology Clinic for follow-up.  Last seen by me via telehealth on 10/19/2018.  Has since followed regularly with her Psychiatrist, with a few medication adjustments over the last year. Previously held off on TCA or SNRI for esophageal hypersensitivity due to issues tolerating venlafaxine in the past and other medications being actively adjusted.    Has had improvement in UGI sxs with FD Guard and Zinc carnosine. Stopped PPI completely and no exacerbation of sxs, c/w Esophageal Hypersensitivity.   Today, her main issue is of chronic constipation. Using "Lion Heart"- a multistrain probiotic with some improvement. Previously [employer] probiotic with improvement as well. Will have a BM most AMs, but feeling of incomplete evacuation. Using squatty potty, but can take 30-45 mins to have BM, but not necessarily straining throughout process. [place] 6. Not using laxatives or stool softeners.   Still with ongoing c/o nausea on most mornings.   Still unable to gain weight back to baseline. 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   Rare nocturnal sxs since starting zinc carnosine and FDGuard.   Psyciatrist asking about issue with absorption rate of meds- having nightmares,     Review of systems:     No chest pain, no SOB, no fevers, no urinary sx   Past Medical  History:  Diagnosis Date  . Acne   .  Anxiety and depression 05/02/2013   Intolerant to lexapro and effexor   . Constipation   . GERD 10/19/2009   Failed omeprazole and protonix.   Marland Kitchen GERD (gastroesophageal reflux disease)   . Mouth ulcer   . Psoriasis    of the scalp  . Wears contact lenses   . Wears glasses     Patient's surgical history, family medical history, social history, medications and allergies were all reviewed in Epic    Current Outpatient Medications  Medication Sig Dispense Refill  . AMBULATORY NON FORMULARY MEDICATION FD Guard    . amphetamine-dextroamphetamine (ADDERALL XR) 15 MG 24 hr capsule Take 1 capsule by mouth every morning. 30 capsule 0  . clobetasol (OLUX) 0.05 % topical foam Apply topically 2 (two) times daily. (Patient taking differently: Apply topically as needed. ) 50 g 5  . clonazePAM (KLONOPIN) 0.5 MG tablet as needed.     . drospirenone-ethinyl estradiol (YAZ,GIANVI,LORYNA) 3-0.02 MG tablet Take 1 tablet by mouth daily. 30 tablet 11  . hydrocortisone (ANUSOL-HC) 2.5 % rectal cream Place 1 application rectally 2 (two) times daily. 28 g 2  . hydrocortisone 2.5 % cream     . Multiple Vitamin (MULTIVITAMIN) capsule Take 1 capsule by mouth daily.    . ondansetron (ZOFRAN-ODT) 4 MG disintegrating tablet Take 1 tablet (4 mg total) by mouth every 8 (eight) hours as needed for nausea or vomiting. 30 tablet 5  . prazosin (MINIPRESS) 2 MG capsule Take 2 mg by mouth at bedtime.    . Probiotic Product (PROBIOTIC DAILY PO) Take by mouth.    . promethazine (PHENERGAN) 25 MG tablet Take 1 tablet (25 mg total) by mouth every 6 (six) hours as needed for nausea or vomiting. Given only 12 pills 30 tablet 1  . spironolactone (ALDACTONE) 100 MG tablet Take 100 mg by mouth daily.    Melene Muller ON 11/01/2019] amphetamine-dextroamphetamine (ADDERALL XR) 20 MG 24 hr capsule Take 1 capsule (20 mg total) by mouth in the morning. (Patient not taking: Reported on 10/10/2019) 30 capsule 0   . promethazine (PHENERGAN) 25 MG suppository Place 1 suppository (25 mg total) rectally every 6 (six) hours as needed for nausea or vomiting. (Patient not taking: Reported on 10/10/2019) 12 each 1   No current facility-administered medications for this visit.    Physical Exam:     BP 110/76   Pulse 92   Ht 5\' 6"  (1.676 m)   Wt 109 lb 8 oz (49.7 kg)   BMI 17.67 kg/m   GENERAL:  Pleasant, thin female in NAD PSYCH: : Cooperative, normal affect NEURO: Alert and oriented x 3, no focal neurologic deficits   IMPRESSION and PLAN:    1) Chronic Constipation Long standing hx of constipation. No worse now, but continues to be bothersome and she would like to address further. Discussed ddx, to include med side effect, IBS-C, motility d/o, etc, and will eval as below:  - Sitz Marker study - Pending results, may consider ARM- discussed that study in detail today - Discussed possible referral to PT for pelvic floor retraining/biofeedback pending results - Holding off on new meds, laxatives, etc pending eval as above first - Consider colonoscopy  2) Esophageal Hypersensitivity 3) Functional dyspepsia 4) Nausea  - Resume Zofran prn - Resume FD Guard and Zinc carnosine given good clinical response - Continue PO intake as tolerated with calorie-rich foods, supplemental drinks, etc. 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

## 2019-10-10 NOTE — Patient Instructions (Signed)
If you are age 29 or older, your body mass index should be between 23-30. Your Body mass index is 17.67 kg/m. If this is out of the aforementioned range listed, please consider follow up with your Primary Care Provider.  If you are age 35 or younger, your body mass index should be between 19-25. Your Body mass index is 17.67 kg/m. If this is out of the aformentioned range listed, please consider follow up with your Primary Care Provider.   You have been given a Sitzmarks.  Please follow instructions.  Return for an x-ray in five days at [facility]  It was a pleasure to see you today!  Vito Cirigliano, D.O.

## 2019-10-16 ENCOUNTER — Ambulatory Visit (INDEPENDENT_AMBULATORY_CARE_PROVIDER_SITE_OTHER): Payer: Federal, State, Local not specified - PPO

## 2019-10-16 ENCOUNTER — Other Ambulatory Visit: Payer: Self-pay

## 2019-10-16 DIAGNOSIS — R110 Nausea: Secondary | ICD-10-CM | POA: Diagnosis not present

## 2019-10-16 DIAGNOSIS — K5900 Constipation, unspecified: Secondary | ICD-10-CM

## 2019-10-23 NOTE — Progress Notes (Signed)
Saw GI note about not wanting to start miralax. She could consider linzess or trulance daily for constipation as well. Thoughts? This is similar to [place] which you have already tried.

## 2019-11-07 ENCOUNTER — Encounter: Payer: Self-pay | Admitting: Physician Assistant

## 2019-11-09 DIAGNOSIS — F331 Major depressive disorder, recurrent, moderate: Secondary | ICD-10-CM | POA: Diagnosis not present

## 2019-11-10 DIAGNOSIS — F331 Major depressive disorder, recurrent, moderate: Secondary | ICD-10-CM | POA: Diagnosis not present

## 2019-11-22 ENCOUNTER — Encounter (HOSPITAL_COMMUNITY): Payer: Self-pay | Admitting: Psychiatry

## 2019-11-22 ENCOUNTER — Telehealth (INDEPENDENT_AMBULATORY_CARE_PROVIDER_SITE_OTHER): Payer: Federal, State, Local not specified - PPO | Admitting: Psychiatry

## 2019-11-22 ENCOUNTER — Other Ambulatory Visit: Payer: Self-pay

## 2019-11-22 DIAGNOSIS — F410 Panic disorder [episodic paroxysmal anxiety] without agoraphobia: Secondary | ICD-10-CM

## 2019-11-22 DIAGNOSIS — F331 Major depressive disorder, recurrent, moderate: Secondary | ICD-10-CM | POA: Diagnosis not present

## 2019-11-22 DIAGNOSIS — F411 Generalized anxiety disorder: Secondary | ICD-10-CM | POA: Diagnosis not present

## 2019-11-22 DIAGNOSIS — F515 Nightmare disorder: Secondary | ICD-10-CM

## 2019-11-22 DIAGNOSIS — F900 Attention-deficit hyperactivity disorder, predominantly inattentive type: Secondary | ICD-10-CM

## 2019-11-22 MED ORDER — AMPHETAMINE-DEXTROAMPHET ER 15 MG PO CP24: 15.0000 mg | ORAL_CAPSULE | Freq: Every day | ORAL | 0 refills | Status: DC

## 2019-11-22 MED ORDER — TIZANIDINE HCL 2 MG PO CAPS: 2.0000 mg | ORAL_CAPSULE | Freq: Every day | ORAL | 1 refills | Status: DC

## 2019-11-22 NOTE — Progress Notes (Addendum)
BH MD OP Progress Note  Virtual Visit via Telephone Note  I connected with [name] on 11/22/19 at  4:00 PM EDT by telephone and verified that I am speaking with the correct person using two identifiers.  Location: Patient: home Provider: Clinic   I discussed the limitations, risks, security and privacy concerns of performing an evaluation and management service by telephone and the availability of in person appointments. I also discussed with the patient that there may be a patient responsible charge related to this service. The patient expressed understanding and agreed to proceed.   I provided 19 minutes of non-face-to-face time during this encounter.    11/22/2019 4:11 PM Andrea Baker  MRN:  [id number]  Chief Complaint: " I stopped taking the prazosin."  HPI: Patient reported that she decided to discontinue the prazosin a few weeks ago.  She stated that even with prazosin she was having vivid dreams and nightmares and she felt lightheaded and tired when she woke up in the mornings.  She stated that after she discontinued the prazosin her vivid dreams and nightmares are pretty much remained the same however she thinks she is getting a little better quality of sleep.  She also is not having to deal with the next morning drowsiness and other issues. She was seen by the GI specialist few weeks ago.  He ordered an x-ray of the abdomen and based on that she was assessed to have significant constipation with large fecal accumulation in the bowels. She is not taking medications to address that and probiotics.  She reported that the Adderall XR 15 mg dose was helpful and she was able to get more work done.  And when she increase the dose to 20 mg the following month she noticed that her jaw was clenching and she felt the medicine dose was a little too strong for her. She requested for the dose to be dropped back to 15 mg for the time being.  She did discuss the issue of poor absorption  rates of medications with her GI specialist who did not really have any input regarding that.  She informed that she has joined a Administrator, sports group for recurring nightmares and vivid dreams.  She stated that she came across a few medications on that group that others found to be helpful.  She mentioned olanzapine and zonisamide.  Patient was informed that olanzapine is an atypical antipsychotic which does help with sleep however it comes with significant side effects of excessive weight gain due to increased appetite and other metabolic adverse effects.  She was also informed about zonisamide being an antiepileptic.  She stated that she wanted to stay away from that as she did not have good response to Lamictal which is also antiepileptic in the past.  Writer informed her the possibility of trying tizanidine which is a muscle relaxant to help with the nightmares.  Writer shared clinical response of one of the patient's with similar issues like hers. Potential side effects of medication and risks vs benefits of treatment vs non-treatment were explained and discussed. All questions were answered.  Patient was willing to give it a try.  Visit Diagnosis:    ICD-10-CM   1. MDD (major depressive disorder), recurrent episode, moderate (HCC)  F33.1   2. GAD (generalized anxiety disorder)  F41.1   3. Panic attacks  F41.0   4. 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 Acne   . Anxiety and depression 05/02/2013   Intolerant to lexapro and effexor   . Constipation   . GERD 10/19/2009   Failed omeprazole and protonix.   Marland Kitchen GERD (gastroesophageal reflux disease)   . Mouth ulcer   . Psoriasis    of the scalp  . Wears contact lenses   . Wears glasses     Past Surgical History:   Procedure Laterality Date  . ESOPHAGEAL MANOMETRY N/A 09/12/2018   Procedure: ESOPHAGEAL MANOMETRY (EM);  Surgeon: Napoleon Form, MD;  Location: [facility];  Service: Endoscopy;  Laterality: N/A;  . ESOPHAGOGASTRODUODENOSCOPY     x2 have been normal and gastric emptying has been normal   . ESOPHAGOGASTRODUODENOSCOPY    . gastric empyting study     . PH IMPEDANCE STUDY N/A 09/12/2018   Procedure: PH IMPEDANCE STUDY;  Surgeon: Napoleon Form, MD;  Location: [facility];  Service: Endoscopy;  Laterality: N/A;    Family Psychiatric History: Mother- depression and is currently prescribed Effexor.  Mother also has been diagnosed with ADHD and has tried Vyvanse in the past.  Family History:  Family History  Problem Relation Age of Onset  . Depression Mother   . Heart attack Father   . Diabetes Father        prediabetic  . Hypertension Father   . Hyperlipidemia Father   . Diabetes Paternal Grandmother   . Colon cancer Neg Hx   . Esophageal cancer Neg Hx   . Rectal cancer Neg Hx   . Stomach cancer Neg Hx     Social History:  Social History   Socioeconomic History  . Marital status: Single    Spouse name: Not on file  . Number of children: Not on file  . Years of education: Not on file  . Highest education level: Not on file  Occupational History  . Not on file  Tobacco Use  . Smoking status: Never Smoker  . Smokeless tobacco: Never Used  Vaping Use  . Vaping Use: Never used  Substance and Sexual Activity  . Alcohol use: Yes    Comment: Hard selzer once very 3 months. Cant tolerate alcoloh  . Drug use: No  . Sexual activity: Not Currently  Other Topics Concern  . Not on file  Social History Narrative   LIVES WITH PARENTS.  STUDENT AT [employer]).     Social Determinants of Health   Financial Resource Strain:   . Difficulty of Paying Living Expenses: Not on file  Food Insecurity:   . Worried About Programme researcher, broadcasting/film/video in the Last Year: Not on file  .  Ran Out of Food in the Last Year: Not on file  Transportation Needs:   . Lack of Transportation (Medical): Not on file  . Lack of Transportation (Non-Medical): Not on file  Physical Activity:   . Days of Exercise per Week: Not on file  . Minutes of Exercise per Session: Not on file  Stress:   . Feeling of Stress : Not on file  Social Connections:   . Frequency of Communication with Friends and Family: Not on file  . Frequency of Social Gatherings with Friends and Family: Not on file  . Attends Religious Services: Not on file  . Active Member of Clubs or Organizations: Not on file  . Attends Banker Meetings: Not  on file  . Marital Status: Not on file    Allergies:  Allergies  Allergen Reactions  . Doxycycline     Nausea and vomiting   . Pristiq [Desvenlafaxine]     Nausea/vomiting/headache  . Augmentin [Amoxicillin-Pot Clavulanate]     Vomiting when taken 10 years ago. Has taken amoxicillin without problems.    Metabolic Disorder Labs: No results found for: HGBA1C, MPG No results found for: PROLACTIN Lab Results  Component Value Date   CHOL 153 09/29/2013   TRIG 53 09/29/2013   HDL 47 09/29/2013   CHOLHDL 3.3 09/29/2013   VLDL 11 09/29/2013   LDLCALC 95 09/29/2013   Lab Results  Component Value Date   TSH 1.92 04/29/2018    Therapeutic Level Labs: No results found for: LITHIUM No results found for: VALPROATE No components found for:  CBMZ  Current Medications: Current Outpatient Medications  Medication Sig Dispense Refill  . Alum Hydroxide-Mag Carbonate (GAVISCON PO) Take by mouth as needed.    . AMBULATORY NON FORMULARY MEDICATION FD Guard    . AMBULATORY NON FORMULARY MEDICATION as needed. zinc-carnosine    . amphetamine-dextroamphetamine (ADDERALL XR) 15 MG 24 hr capsule Take 1 capsule by mouth every morning. 30 capsule 0  . amphetamine-dextroamphetamine (ADDERALL XR) 20 MG 24 hr capsule Take 1 capsule (20 mg total) by mouth in the morning.  (Patient not taking: Reported on 10/10/2019) 30 capsule 0  . Calcipotriene-Betameth Diprop (ENSTILAR) 0.005-0.064 % FOAM Apply topically as needed.    . clobetasol (OLUX) 0.05 % topical foam Apply topically 2 (two) times daily. (Patient taking differently: Apply topically as needed. ) 50 g 5  . clonazePAM (KLONOPIN) 0.5 MG tablet as needed.     . drospirenone-ethinyl estradiol (YAZ,GIANVI,LORYNA) 3-0.02 MG tablet Take 1 tablet by mouth daily. 30 tablet 11  . hydrocortisone (ANUSOL-HC) 2.5 % rectal cream Place 1 application rectally 2 (two) times daily. 28 g 2  . hydrocortisone 2.5 % cream     . Multiple Vitamin (MULTIVITAMIN) capsule Take 1 capsule by mouth daily.    . ondansetron (ZOFRAN-ODT) 4 MG disintegrating tablet Take 1 tablet (4 mg total) by mouth every 8 (eight) hours as needed for nausea or vomiting. 30 tablet 5  . prazosin (MINIPRESS) 2 MG capsule Take 2 mg by mouth at bedtime.    . Probiotic Product (PROBIOTIC DAILY PO) Take by mouth.    . promethazine (PHENERGAN) 25 MG suppository Place 1 suppository (25 mg total) rectally every 6 (six) hours as needed for nausea or vomiting. (Patient not taking: Reported on 10/10/2019) 12 each 1  . promethazine (PHENERGAN) 25 MG tablet Take 1 tablet (25 mg total) by mouth every 6 (six) hours as needed for nausea or vomiting. Given only 12 pills 30 tablet 1  . spironolactone (ALDACTONE) 100 MG tablet Take 100 mg by mouth daily.     No current facility-administered medications for this visit.    Musculoskeletal: Strength & Muscle Tone: unable to assess due to telemed visit Gait & Station: unable to assess due to telemed visit Patient leans: unable to assess due to telemed visit  Psychiatric Specialty Exam: Review of Systems    There were no vitals taken for this visit.There is no height or weight on file to calculate BMI.  General Appearance: unable to assess due to phone visit  Eye Contact:  unable to assess due to phone visit  Speech:  Clear  and Coherent and Normal Rate  Volume:  Normal  Mood:  Depressed  Affect:  Congruent  Thought Process:  Goal Directed and Descriptions of Associations: Intact  Orientation:  Full (Time, Place, and Person)  Thought Content: Logical   Suicidal Thoughts:  No  Homicidal Thoughts:  No  Memory:  Immediate;   Good Recent;   Good  Judgement:  Fair  Insight:  Fair  Psychomotor Activity:  Normal  Concentration:  Concentration: Good and Attention Span: Fair  Recall:  Good  Fund of Knowledge: Good  Language: Good  Akathisia:  Negative  Handed:  Right  AIMS (if indicated): not done  Assets:  Communication Skills Desire for Improvement Financial Resources/Insurance Housing  ADL's:  Intact  Cognition: WNL  Sleep:  Fair, still has nightmares and vivid dreams   Screenings:  GAD-7     [facility] [place] [facility] [place] [facility] [place] [facility] [place]  Total GAD-7 Score 19 18 15 16     PHQ2-9     [facility]/2019 [facility] Video [facility] [place] Office Visit from 11/24/2018 in [facility] Video Visit [facility]/11[facility]2018 [facility] [place] Office Visit from [facility]/03/2018 [facility] [place]  PHQ-2 Total Score 3 5 6 4 6   PHQ-9 Total Score 11 21 23 18 20      Adult ADHD self-report scale (ASRS-v1.1) Symptom Checklist- was conducted on 05/10/19.   Assessment and Plan:    1. MDD (major depressive disorder), recurrent episode, moderate (HCC) - Has tried several different anti-depressants and mood stabilizers, but has not responded well and has developed  adverse effects.  2. GAD (generalized anxiety disorder)   3. Panic attacks   4. Attention deficit hyperactivity disorder (ADHD), predominantly inattentive type - Reduce back to amphetamine-dextroamphetamine (ADDERALL XR) 15 MG 24 hr capsule; Take 1 capsule by mouth every morning.  Dispense: 30 capsule; Refill: 0 for 1 month   5. Nightmare disorder - Stopped taking Prazosin as was not fully beneficial. - Trial of Tizanidine 2 mg HS for nightmares.  Follow up in 8 weeks. Continue individual therapy.   Zena AmosMandeep Vincente Asbridge, MD 11/22/2019, 4:11 PM

## 2020-01-23 ENCOUNTER — Telehealth (INDEPENDENT_AMBULATORY_CARE_PROVIDER_SITE_OTHER): Payer: Federal, State, Local not specified - PPO | Admitting: Psychiatry

## 2020-01-23 ENCOUNTER — Other Ambulatory Visit: Payer: Self-pay

## 2020-01-23 ENCOUNTER — Encounter (HOSPITAL_COMMUNITY): Payer: Self-pay | Admitting: Psychiatry

## 2020-01-23 DIAGNOSIS — F900 Attention-deficit hyperactivity disorder, predominantly inattentive type: Secondary | ICD-10-CM | POA: Diagnosis not present

## 2020-01-23 DIAGNOSIS — F331 Major depressive disorder, recurrent, moderate: Secondary | ICD-10-CM | POA: Diagnosis not present

## 2020-01-23 DIAGNOSIS — F515 Nightmare disorder: Secondary | ICD-10-CM

## 2020-01-23 DIAGNOSIS — F411 Generalized anxiety disorder: Secondary | ICD-10-CM | POA: Diagnosis not present

## 2020-01-23 DIAGNOSIS — F410 Panic disorder [episodic paroxysmal anxiety] without agoraphobia: Secondary | ICD-10-CM

## 2020-01-23 MED ORDER — AMPHETAMINE-DEXTROAMPHET ER 15 MG PO CP24: 15.0000 mg | ORAL_CAPSULE | ORAL | 0 refills | Status: DC

## 2020-01-23 MED ORDER — ESZOPICLONE 1 MG PO TABS: 1.0000 mg | ORAL_TABLET | Freq: Every evening | ORAL | 0 refills | Status: DC | PRN

## 2020-01-23 NOTE — Progress Notes (Addendum)
BH MD OP Progress Note  Virtual Visit via Video Note  I connected with Mailani Heiny on 01/23/20 at  4:10 PM EDT by a video enabled telemedicine application and verified that I am speaking with the correct person using two identifiers.  Location: Patient: Home Provider: Clinic   I discussed the limitations of evaluation and management by telemedicine and the availability of in person appointments. The patient expressed understanding and agreed to proceed.  I provided 18 minutes of non-face-to-face time during this encounter.      01/23/2020 4:04 PM Andrea Baker  MRN:  [id number]  Chief Complaint: " I am okay but still having the nightmares."  HPI: Patient reported that she tried the Tizanidine  for 1 full month however did not notice any improvement in her nightmares.  She stated that it did not caused her to have any untoward side effects therefore she took it consistently for 1 full month but when it did not make any difference she did not pick up her refill. She reported that last night she had a very rapid nightmare where she saw monster scratching her throat with her long nails and then leaving the nails in her throat causing her to choke.  She stated that she did have some scratchiness in her throat last night before going to bed and the nightmare kind of was connected to that.  She stated that the other experience was very vivid and very scary. She continues to struggle with poor quality of sleep which makes her feel really tired during the day due to these frequent nightmares.  She stated that she feels like she has not had good night sleep for a year now. 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  She does not believe she needs to go up on the dose at this point. She stated that she wants to try something different to address her sleep. Writer checked EMR and noted that she has never been tried on [place].  Writer recommended a  trial of the next with a plan of starting with 1 mg dose and then titrating it to 2 mg if 1 mg dose is ineffective.  She was informed that she can go up to a dose of 3 mg and try if that helps her any better. Patient checked her insurance formulary and noted that it would be covered.  She was agreeable to try that. Potential side effects of medication and risks vs benefits of treatment vs non-treatment were explained and discussed. All questions were answered.  She informed that she has continued to work with a therapist regarding her nightmares but all the techniques that they have tried together do not seem to be effective anymore.  She asked for any suggestions regarding any other techniques.  She has heard of EMDR.  Visit Diagnosis:    ICD-10-CM   1. MDD (major depressive disorder), recurrent episode, moderate (HCC)  F33.1   2. GAD (generalized anxiety disorder)  F41.1   3. Panic attacks  F41.0   4. 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 Acne   . Anxiety and depression 05/02/2013   Intolerant to lexapro and effexor   . Constipation   . GERD 10/19/2009   Failed omeprazole  and protonix.   Marland Kitchen GERD (gastroesophageal reflux disease)   . Mouth ulcer   . Psoriasis    of the scalp  . Wears contact lenses   . Wears glasses     Past Surgical History:  Procedure Laterality Date  . ESOPHAGEAL MANOMETRY N/A 09/12/2018   Procedure: ESOPHAGEAL MANOMETRY (EM);  Surgeon: Napoleon Form, MD;  Location: [facility];  Service: Endoscopy;  Laterality: N/A;  . ESOPHAGOGASTRODUODENOSCOPY     x2 have been normal and gastric emptying has been normal   . ESOPHAGOGASTRODUODENOSCOPY    . gastric empyting study     . PH IMPEDANCE STUDY N/A 09/12/2018   Procedure: PH IMPEDANCE STUDY;   Surgeon: Napoleon Form, MD;  Location: [facility];  Service: Endoscopy;  Laterality: N/A;    Family Psychiatric History: Mother- depression and is currently prescribed Effexor.  Mother also has been diagnosed with ADHD and has tried Vyvanse in the past.  Family History:  Family History  Problem Relation Age of Onset  . Depression Mother   . Heart attack Father   . Diabetes Father        prediabetic  . Hypertension Father   . Hyperlipidemia Father   . Diabetes Paternal Grandmother   . Colon cancer Neg Hx   . Esophageal cancer Neg Hx   . Rectal cancer Neg Hx   . Stomach cancer Neg Hx     Social History:  Social History   Socioeconomic History  . Marital status: Single    Spouse name: Not on file  . Number of children: Not on file  . Years of education: Not on file  . Highest education level: Not on file  Occupational History  . Not on file  Tobacco Use  . Smoking status: Never Smoker  . Smokeless tobacco: Never Used  Vaping Use  . Vaping Use: Never used  Substance and Sexual Activity  . Alcohol use: Yes    Comment: Hard selzer once very 3 months. Cant tolerate alcoloh  . Drug use: No  . Sexual activity: Not Currently  Other Topics Concern  . Not on file  Social History Narrative   LIVES WITH PARENTS.  STUDENT AT [employer]).     Social Determinants of Health   Financial Resource Strain:   . Difficulty of Paying Living Expenses: Not on file  Food Insecurity:   . Worried About Programme researcher, broadcasting/film/video in the Last Year: Not on file  . Ran Out of Food in the Last Year: Not on file  Transportation Needs:   . Lack of Transportation (Medical): Not on file  . Lack of Transportation (Non-Medical): Not on file  Physical Activity:   . Days of Exercise per Week: Not on file  . Minutes of Exercise per Session: Not on file  Stress:   . Feeling of Stress : Not on file  Social Connections:   . Frequency of Communication with Friends and Family: Not on file  .  Frequency of Social Gatherings with Friends and Family: Not on file  . Attends Religious Services: Not on file  . Active Member of Clubs or Organizations: Not on file  . Attends Banker Meetings: Not on file  . Marital Status: Not on file    Allergies:  Allergies  Allergen Reactions  . Doxycycline     Nausea and vomiting   . Pristiq [Desvenlafaxine]     Nausea/vomiting/headache  . Augmentin [Amoxicillin-Pot Clavulanate]     Vomiting when taken  10 years ago. Has taken amoxicillin without problems.    Metabolic Disorder Labs: No results found for: HGBA1C, MPG No results found for: PROLACTIN Lab Results  Component Value Date   CHOL 153 09/29/2013   TRIG 53 09/29/2013   HDL 47 09/29/2013   CHOLHDL 3.3 09/29/2013   VLDL 11 09/29/2013   LDLCALC 95 09/29/2013   Lab Results  Component Value Date   TSH 1.92 04/29/2018    Therapeutic Level Labs: No results found for: LITHIUM No results found for: VALPROATE No components found for:  CBMZ  Current Medications: Current Outpatient Medications  Medication Sig Dispense Refill  . Alum Hydroxide-Mag Carbonate (GAVISCON PO) Take by mouth as needed.    . AMBULATORY NON FORMULARY MEDICATION FD Guard    . AMBULATORY NON FORMULARY MEDICATION as needed. zinc-carnosine    . amphetamine-dextroamphetamine (ADDERALL XR) 15 MG 24 hr capsule Take 1 capsule by mouth every morning. 30 capsule 0  . amphetamine-dextroamphetamine (ADDERALL XR) 15 MG 24 hr capsule Take 1 capsule by mouth daily. 30 capsule 0  . amphetamine-dextroamphetamine (ADDERALL XR) 15 MG 24 hr capsule Take 1 capsule by mouth daily. 30 capsule 0  . amphetamine-dextroamphetamine (ADDERALL XR) 20 MG 24 hr capsule Take 1 capsule (20 mg total) by mouth in the morning. (Patient not taking: Reported on 10/10/2019) 30 capsule 0  . Calcipotriene-Betameth Diprop (ENSTILAR) 0.005-0.064 % FOAM Apply topically as needed.    . clobetasol (OLUX) 0.05 % topical foam Apply topically 2  (two) times daily. (Patient taking differently: Apply topically as needed. ) 50 g 5  . clonazePAM (KLONOPIN) 0.5 MG tablet as needed.     . drospirenone-ethinyl estradiol (YAZ,GIANVI,LORYNA) 3-0.02 MG tablet Take 1 tablet by mouth daily. 30 tablet 11  . hydrocortisone (ANUSOL-HC) 2.5 % rectal cream Place 1 application rectally 2 (two) times daily. 28 g 2  . hydrocortisone 2.5 % cream     . Multiple Vitamin (MULTIVITAMIN) capsule Take 1 capsule by mouth daily.    . ondansetron (ZOFRAN-ODT) 4 MG disintegrating tablet Take 1 tablet (4 mg total) by mouth every 8 (eight) hours as needed for nausea or vomiting. 30 tablet 5  . prazosin (MINIPRESS) 2 MG capsule Take 2 mg by mouth at bedtime.    . Probiotic Product (PROBIOTIC DAILY PO) Take by mouth.    . promethazine (PHENERGAN) 25 MG suppository Place 1 suppository (25 mg total) rectally every 6 (six) hours as needed for nausea or vomiting. (Patient not taking: Reported on 10/10/2019) 12 each 1  . promethazine (PHENERGAN) 25 MG tablet Take 1 tablet (25 mg total) by mouth every 6 (six) hours as needed for nausea or vomiting. Given only 12 pills 30 tablet 1  . spironolactone (ALDACTONE) 100 MG tablet Take 100 mg by mouth daily.    . tizanidine (ZANAFLEX) 2 MG capsule Take 1 capsule (2 mg total) by mouth at bedtime. 30 capsule 1   No current facility-administered medications for this visit.    Musculoskeletal: Strength & Muscle Tone: unable to assess due to telemed visit Gait & Station: unable to assess due to telemed visit Patient leans: unable to assess due to telemed visit  Psychiatric Specialty Exam: Review of Systems    There were no vitals taken for this visit.There is no height or weight on file to calculate BMI.  General Appearance: well groomed  Eye Contact:  good  Speech:  Clear and Coherent and Normal Rate  Volume:  Normal  Mood:  Depressed  Affect:  Congruent  Thought Process:  Goal Directed and Descriptions of Associations: Intact   Orientation:  Full (Time, Place, and Person)  Thought Content: Logical   Suicidal Thoughts:  No  Homicidal Thoughts:  No  Memory:  Immediate;   Good Recent;   Good  Judgement:  Fair  Insight:  Fair  Psychomotor Activity:  Normal  Concentration:  Concentration: Good and Attention Span: Fair  Recall:  Good  Fund of Knowledge: Good  Language: Good  Akathisia:  Negative  Handed:  Right  AIMS (if indicated): not done  Assets:  Communication Skills Desire for Improvement Financial Resources/Insurance Housing  ADL's:  Intact  Cognition: WNL  Sleep:  Fair, continues to have frequent nightmares and vivid dreams   Screenings:  GAD-7     Video Visit from 02/15/2019 in [facility] Video Visit from 10/11/2018 in [facility] Office Visit from 06/03/2018 in [facility] Office Visit from 04/29/2018 in [facility]  Total GAD-7 Score 19 18 15 16     PHQ2-9     [facility]/30/2019 [facility] Video Visit from 02/15/2019 in [facility] Office Visit from 11/24/2018 in [facility] Video Visit from 10/11/2018 in [facility] Office Visit from 06/03/2018 in [facility]  PHQ-2 Total Score 3 5 6 4 6   PHQ-9 Total Score 11 21 23 18 20      Adult ADHD self-report scale (ASRS-v1.1) Symptom Checklist- was conducted on 05/10/19.   Assessment and Plan:    1. MDD (major depressive disorder), recurrent episode, moderate (HCC) - Has tried several different anti-depressants and mood stabilizers, but has not responded well and has developed adverse effects to majority of them.  2. GAD (generalized anxiety disorder)   3. Panic attacks   4. Attention deficit hyperactivity disorder  (ADHD), predominantly inattentive type - Continue amphetamine-dextroamphetamine (ADDERALL XR) 15 MG 24 hr capsule; Take 1 capsule by mouth every morning.  Dispense: 30 capsule  5. Nightmare disorder  - Start Lunesta 1 mg HS, wmay titrate dose up to 3 mg HS depending upon response and tolerability.   Follow up in 4 weeks. Continue individual therapy.   , MD 01/23/2020, 4:04 PM

## 2020-02-05 DIAGNOSIS — J309 Allergic rhinitis, unspecified: Secondary | ICD-10-CM | POA: Diagnosis not present

## 2020-02-05 DIAGNOSIS — Z1152 Encounter for screening for COVID-19: Secondary | ICD-10-CM | POA: Diagnosis not present

## 2020-02-05 DIAGNOSIS — R059 Cough, unspecified: Secondary | ICD-10-CM | POA: Diagnosis not present

## 2020-02-21 ENCOUNTER — Other Ambulatory Visit: Payer: Self-pay

## 2020-02-21 ENCOUNTER — Telehealth (INDEPENDENT_AMBULATORY_CARE_PROVIDER_SITE_OTHER): Payer: Federal, State, Local not specified - PPO | Admitting: Psychiatry

## 2020-02-21 ENCOUNTER — Encounter (HOSPITAL_COMMUNITY): Payer: Self-pay | Admitting: Psychiatry

## 2020-02-21 DIAGNOSIS — F411 Generalized anxiety disorder: Secondary | ICD-10-CM

## 2020-02-21 DIAGNOSIS — F331 Major depressive disorder, recurrent, moderate: Secondary | ICD-10-CM | POA: Diagnosis not present

## 2020-02-21 DIAGNOSIS — F900 Attention-deficit hyperactivity disorder, predominantly inattentive type: Secondary | ICD-10-CM | POA: Diagnosis not present

## 2020-02-21 DIAGNOSIS — F410 Panic disorder [episodic paroxysmal anxiety] without agoraphobia: Secondary | ICD-10-CM

## 2020-02-21 DIAGNOSIS — F515 Nightmare disorder: Secondary | ICD-10-CM

## 2020-02-21 MED ORDER — AMPHETAMINE-DEXTROAMPHET ER 15 MG PO CP24: 15.0000 mg | ORAL_CAPSULE | ORAL | 0 refills | Status: DC

## 2020-02-21 MED ORDER — AMPHETAMINE-DEXTROAMPHET ER 15 MG PO CP24: 15.0000 mg | ORAL_CAPSULE | Freq: Every day | ORAL | 0 refills | Status: DC

## 2020-02-21 NOTE — Progress Notes (Addendum)
BH MD OP Progress Note  Virtual Visit via Telephone Note  I connected with Andrea Baker on 02/21/20 at  4:20 PM EST by telephone and verified that I am speaking with the correct person using two identifiers.  Location: Patient: home Provider: Clinic   I discussed the limitations, risks, security and privacy concerns of performing an evaluation and management service by telephone and the availability of in person appointments. I also discussed with the patient that there may be a patient responsible charge related to this service. The patient expressed understanding and agreed to proceed.   I provided 17 minutes of non-face-to-face time during this encounter.    02/21/2020 4:16 PM Andrea Baker  MRN:  [id number]  Chief Complaint: " Alfonso Patten was not a good fit for me."  HPI: Patient reported that Lunesta did not work well for her and it caused her to have severe headache.  She stated that she tried it on 3 different days and each time she had of having headache.  The first time it started the headache was very severe and did not improve at all with all the over-the-counter pain medications including ibuprofen and Tylenol.  Patient then went to see urgent care provider and was assessed to have mild viral infection going on which improved after few days.  Patient then once again tried [place] but did not find it beneficial and ended up having another episode of headache the next morning.  And then after few days when she tried for the third time the same thing happened. Patient stated that she continues to have the vivid dreams and still does not get good quality sleep at night. Writer informed her that maybe hypnotherapy may be a viable option that she can consider.  She stated that she has been looking into EMDR providers in her local area. Writer gave her information of a clinic that provides hypnotherapy services in [place].  Regarding her Adderall XR dose, patient reported that she thinks  she is doing fairly well with current dose of Adderall XR 15 mg.  She stated that sometimes she feels that because of the medication she gets focused on things that she should be focusing on she given example of how sometimes when she is at work she may get focused on making lists of things to do later. However she does not want to increase the dose because with higher dose in the recent past she had developed some jaw tightness and clenching.      Visit Diagnosis:    ICD-10-CM   1. MDD (major depressive disorder), recurrent episode, moderate (HCC)  F33.1   2. Panic attacks  F41.0   3. GAD (generalized anxiety disorder)  F41.1   4. 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 Acne   . Anxiety and depression 05/02/2013   Intolerant to lexapro and effexor   . Constipation   . GERD 10/19/2009   Failed omeprazole and protonix.   Marland Kitchen GERD (gastroesophageal reflux disease)   . Mouth ulcer   . Psoriasis    of the scalp  . Wears contact lenses   . Wears glasses     Past Surgical History:  Procedure Laterality Date  . ESOPHAGEAL MANOMETRY N/A 09/12/2018   Procedure: ESOPHAGEAL MANOMETRY (EM);  Surgeon: Napoleon Form,  MD;  Location: [facility];  Service: Endoscopy;  Laterality: N/A;  . ESOPHAGOGASTRODUODENOSCOPY     x2 have been normal and gastric emptying has been normal   . ESOPHAGOGASTRODUODENOSCOPY    . gastric empyting study     . PH IMPEDANCE STUDY N/A 09/12/2018   Procedure: PH IMPEDANCE STUDY;  Surgeon: Napoleon Form, MD;  Location: [facility];  Service: Endoscopy;  Laterality: N/A;    Family Psychiatric History: Mother- depression and is currently prescribed Effexor.  Mother also has been diagnosed with ADHD and has tried Vyvanse in the past.  Family  History:  Family History  Problem Relation Age of Onset  . Depression Mother   . Heart attack Father   . Diabetes Father        prediabetic  . Hypertension Father   . Hyperlipidemia Father   . Diabetes Paternal Grandmother   . Colon cancer Neg Hx   . Esophageal cancer Neg Hx   . Rectal cancer Neg Hx   . Stomach cancer Neg Hx     Social History:  Social History   Socioeconomic History  . Marital status: Single    Spouse name: Not on file  . Number of children: Not on file  . Years of education: Not on file  . Highest education level: Not on file  Occupational History  . Not on file  Tobacco Use  . Smoking status: Never Smoker  . Smokeless tobacco: Never Used  Vaping Use  . Vaping Use: Never used  Substance and Sexual Activity  . Alcohol use: Yes    Comment: Hard selzer once very 3 months. Cant tolerate alcoloh  . Drug use: No  . Sexual activity: Not Currently  Other Topics Concern  . Not on file  Social History Narrative   LIVES WITH PARENTS.  STUDENT AT [employer]     Social Determinants of Health   Financial Resource Strain:   . Difficulty of Paying Living Expenses: Not on file  Food Insecurity:   . Worried About Programme researcher, broadcasting/film/video in the Last Year: Not on file  . Ran Out of Food in the Last Year: Not on file  Transportation Needs:   . Lack of Transportation (Medical): Not on file  . Lack of Transportation (Non-Medical): Not on file  Physical Activity:   . Days of Exercise per Week: Not on file  . Minutes of Exercise per Session: Not on file  Stress:   . Feeling of Stress : Not on file  Social Connections:   . Frequency of Communication with Friends and Family: Not on file  . Frequency of Social Gatherings with Friends and Family: Not on file  . Attends Religious Services: Not on file  . Active Member of Clubs or Organizations: Not on file  . Attends Banker Meetings: Not on file  . Marital Status: Not on file    Allergies:   Allergies  Allergen Reactions  . Doxycycline     Nausea and vomiting   . Pristiq [Desvenlafaxine]     Nausea/vomiting/headache  . Augmentin [Amoxicillin-Pot Clavulanate]     Vomiting when taken 10 years ago. Has taken amoxicillin without problems.    Metabolic Disorder Labs: No results found for: HGBA1C, MPG No results found for: PROLACTIN Lab Results  Component Value Date   CHOL 153 09/29/2013   TRIG 53 09/29/2013   HDL 47 09/29/2013   CHOLHDL 3.3 09/29/2013   VLDL 11 09/29/2013   LDLCALC 95 09/29/2013  Lab Results  Component Value Date   TSH 1.92 04/29/2018    Therapeutic Level Labs: No results found for: LITHIUM No results found for: VALPROATE No components found for:  CBMZ  Current Medications: Current Outpatient Medications  Medication Sig Dispense Refill  . Alum Hydroxide-Mag Carbonate (GAVISCON PO) Take by mouth as needed.    . AMBULATORY NON FORMULARY MEDICATION FD Guard    . AMBULATORY NON FORMULARY MEDICATION as needed. zinc-carnosine    . amphetamine-dextroamphetamine (ADDERALL XR) 15 MG 24 hr capsule Take 1 capsule by mouth daily. 30 capsule 0  . amphetamine-dextroamphetamine (ADDERALL XR) 15 MG 24 hr capsule Take 1 capsule by mouth daily. 30 capsule 0  . amphetamine-dextroamphetamine (ADDERALL XR) 15 MG 24 hr capsule Take 1 capsule by mouth every morning. 30 capsule 0  . amphetamine-dextroamphetamine (ADDERALL XR) 20 MG 24 hr capsule Take 1 capsule (20 mg total) by mouth in the morning. (Patient not taking: Reported on 10/10/2019) 30 capsule 0  . Calcipotriene-Betameth Diprop (ENSTILAR) 0.005-0.064 % FOAM Apply topically as needed.    . clobetasol (OLUX) 0.05 % topical foam Apply topically 2 (two) times daily. (Patient taking differently: Apply topically as needed. ) 50 g 5  . clonazePAM (KLONOPIN) 0.5 MG tablet as needed.     . drospirenone-ethinyl estradiol (YAZ,GIANVI,LORYNA) 3-0.02 MG tablet Take 1 tablet by mouth daily. 30 tablet 11  . eszopiclone  (LUNESTA) 1 MG TABS tablet Take 1 tablet (1 mg total) by mouth at bedtime as needed for sleep. Take immediately before bedtime 30 tablet 0  . hydrocortisone (ANUSOL-HC) 2.5 % rectal cream Place 1 application rectally 2 (two) times daily. 28 g 2  . hydrocortisone 2.5 % cream     . Multiple Vitamin (MULTIVITAMIN) capsule Take 1 capsule by mouth daily.    . ondansetron (ZOFRAN-ODT) 4 MG disintegrating tablet Take 1 tablet (4 mg total) by mouth every 8 (eight) hours as needed for nausea or vomiting. 30 tablet 5  . prazosin (MINIPRESS) 2 MG capsule Take 2 mg by mouth at bedtime.    . Probiotic Product (PROBIOTIC DAILY PO) Take by mouth.    . promethazine (PHENERGAN) 25 MG suppository Place 1 suppository (25 mg total) rectally every 6 (six) hours as needed for nausea or vomiting. (Patient not taking: Reported on 10/10/2019) 12 each 1  . promethazine (PHENERGAN) 25 MG tablet Take 1 tablet (25 mg total) by mouth every 6 (six) hours as needed for nausea or vomiting. Given only 12 pills 30 tablet 1  . spironolactone (ALDACTONE) 100 MG tablet Take 100 mg by mouth daily.    . tizanidine (ZANAFLEX) 2 MG capsule Take 1 capsule (2 mg total) by mouth at bedtime. 30 capsule 1   No current facility-administered medications for this visit.    Musculoskeletal: Strength & Muscle Tone: unable to assess due to telemed visit Gait & Station: unable to assess due to telemed visit Patient leans: unable to assess due to telemed visit  Psychiatric Specialty Exam: Review of Systems    There were no vitals taken for this visit.There is no height or weight on file to calculate BMI.  General Appearance: well groomed  Eye Contact:  good  Speech:  Clear and Coherent and Normal Rate  Volume:  Normal  Mood:  Depressed  Affect:  Congruent  Thought Process:  Goal Directed and Descriptions of Associations: Intact  Orientation:  Full (Time, Place, and Person)  Thought Content: Logical   Suicidal Thoughts:  No  Homicidal  Thoughts:  No  Memory:  Immediate;   Good Recent;   Good  Judgement:  Fair  Insight:  Fair  Psychomotor Activity:  Normal  Concentration:  Concentration: Good and Attention Span: Fair  Recall:  Good  Fund of Knowledge: Good  Language: Good  Akathisia:  Negative  Handed:  Right  AIMS (if indicated): not done  Assets:  Communication Skills Desire for Improvement Financial Resources/Insurance Housing  ADL's:  Intact  Cognition: WNL  Sleep:  Fair, continues to have frequent nightmares and vivid dreams   Screenings:  GAD-7     Video Visit from 02/15/2019 in [facility] Video Visit from 10/11/2018 in [facility] Office Visit from 06/03/2018 in [facility] Office Visit from 04/29/2018 in [facility]  Total GAD-7 Score 19 18 15 16     PHQ2-9     [facility]/30/2019 [facility] Video Visit from 02/15/2019 in [facility] Office Visit from 11/24/2018 in [facility] Video Visit from 10[facility]11/2018 in [facility] Office Visit from 06/03/2018 in [facility] At [facility]  PHQ-2 Total Score 3 5 6 4 6   PHQ-9 Total Score 11 21 23 18 20      Adult ADHD self-report scale (ASRS-v1.1) Symptom Checklist- was conducted on 05/10/19.   Assessment and Plan: Patient continues to have vivid dreams and nightmares with poor quality of sleep.  Patient has tried several different hypnotic medications however none of them have help with sleep and vivid dreams.  She was recommended seeing a hypnotherapist to see if hypnotherapy would be something that can help her with her ongoing vivid dreams and nightmares. Writer also suggested that maybe seeing a sleep specialist would also be beneficial given her  chronicity of the symptoms.  Patient informed that she will definitely look into these and she will probably be making the appointments in January because she is getting an upgrade in her insurance end of this year.  1. MDD (major depressive disorder), recurrent episode, moderate (HCC) - Has tried several different anti-depressants and mood stabilizers, but has not responded well and has developed adverse effects to majority of them.  2. GAD (generalized anxiety disorder)   3. Panic attacks   4. Attention deficit hyperactivity disorder (ADHD), predominantly inattentive type - Continue amphetamine-dextroamphetamine (ADDERALL XR) 15 MG 24 hr capsule; Take 1 capsule by mouth every morning.  Dispense: 30 capsule  5. Nightmare disorder   F/up in 3 months.   , MD 02/21/2020, 4:16 PM

## 2020-03-19 DIAGNOSIS — L400 Psoriasis vulgaris: Secondary | ICD-10-CM | POA: Diagnosis not present

## 2020-03-27 DIAGNOSIS — Z681 Body mass index (BMI) 19 or less, adult: Secondary | ICD-10-CM | POA: Diagnosis not present

## 2020-03-27 DIAGNOSIS — Z01419 Encounter for gynecological examination (general) (routine) without abnormal findings: Secondary | ICD-10-CM | POA: Diagnosis not present

## 2020-04-17 ENCOUNTER — Emergency Department
Admission: EM | Admit: 2020-04-17 | Discharge: 2020-04-17 | Disposition: A | Discharge status: Home / Self Care | Payer: Federal, State, Local not specified - PPO | Source: Home / Self Care

## 2020-04-17 ENCOUNTER — Other Ambulatory Visit: Payer: Self-pay

## 2020-04-17 ENCOUNTER — Encounter: Payer: Self-pay | Admitting: *Deleted

## 2020-04-17 DIAGNOSIS — R4189 Other symptoms and signs involving cognitive functions and awareness: Secondary | ICD-10-CM

## 2020-04-17 DIAGNOSIS — R110 Nausea: Secondary | ICD-10-CM

## 2020-04-17 DIAGNOSIS — R519 Headache, unspecified: Secondary | ICD-10-CM | POA: Diagnosis not present

## 2020-04-17 LAB — POCT CBC W AUTO DIFF (K'VILLE URGENT CARE)

## 2020-04-17 MED ORDER — METOCLOPRAMIDE HCL 5 MG/ML IJ SOLN
5.0000 mg | Freq: Once | INTRAMUSCULAR | Status: AC
  Administered 2020-04-17: 5 mg via INTRAMUSCULAR

## 2020-04-17 MED ORDER — DEXAMETHASONE SODIUM PHOSPHATE 10 MG/ML IJ SOLN
10.0000 mg | Freq: Once | INTRAMUSCULAR | Status: AC
  Administered 2020-04-17: 10 mg via INTRAMUSCULAR

## 2020-04-17 MED ORDER — KETOROLAC TROMETHAMINE 60 MG/2ML IM SOLN
60.0000 mg | Freq: Once | INTRAMUSCULAR | Status: AC
  Administered 2020-04-17: 60 mg via INTRAMUSCULAR

## 2020-04-17 NOTE — ED Triage Notes (Addendum)
Pt c/o HA and more brain fog that usual x Monday night.Taking Tylenol and Advil, and old Hydrocodone. She had an episode on Monday at work where she felt like her head was "empty". She has a history of ADHD and has brain fog, but it is usually helped by her Adderall. She reports this is the worst HA she has ever had. Dermatologist did steroid injection in her scalp in December, but she does not think that is related. She also notes seeing veins on her temples that she has not noticed before.

## 2020-04-17 NOTE — ED Provider Notes (Signed)
[facility]    CSN: [id number] Arrival date & time: 04/17/20  0913      History   Chief Complaint Chief Complaint  Patient presents with  . Headache    HPI Andrea Baker is a 30 y.o. female.   HPI Andrea Baker is a 30 y.o. female presenting to UC with c/o generalized HA that started 2 days ago with associated "brain fog" that is worse than her usual ADHD.  She has taken Tylenol, advil, and old hydrocodone without relief. Pain is aching and throbbing 5/10, worse on frontal and temporal areas. Pt also reports noticing more prominent veins in her temporal areas over the last month.  She was seen at [facility] in 2020 for a HA, was given toradol, which helped but she related that HA to a new medication.  This HA is more persistent and she denies medication changes. 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  Denies cough, congestion, sore throat. No vomiting or diarrhea but has had nausea. No medication taken today for symptoms.    Past Medical History:  Diagnosis Date  . Acne   . Anxiety and depression 05/02/2013   Intolerant to lexapro and effexor   . Constipation   . GERD 10/19/2009   Failed omeprazole and protonix.   Marland Kitchen GERD (gastroesophageal reflux disease)   . Mouth ulcer   . Psoriasis    of the scalp  . Wears contact lenses   . Wears glasses     Patient Active Problem List   Diagnosis Date Noted  . Attention deficit hyperactivity disorder (ADHD), predominantly inattentive type 05/10/2019  . Psoriasis 10/24/2018  . GAD (generalized anxiety disorder) 10/13/2018  . MDD (major depressive disorder), recurrent episode, moderate (HCC) 10/13/2018  . Dyspepsia 10/13/2018  . Non-intractable vomiting   . Insomnia 06/06/2018  . Panic attacks 05/02/2018  . Unexplained weight loss 05/02/2018  . 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 Grade I hemorrhoids 06/08/2017  . Seborrheic dermatitis of scalp 08/11/2016  . Canker  sores oral 10/28/2015  . Chronic idiopathic constipation 06/12/2015  . Eczema 05/17/2013  . Anxiety and depression 05/02/2013  . Concentration deficit 05/02/2013  . Allergic rhinitis 08/07/2011  . Gastroesophageal reflux disease 10/19/2009    Past Surgical History:  Procedure Laterality Date  . ESOPHAGEAL MANOMETRY N/A 09/12/2018   Procedure: ESOPHAGEAL MANOMETRY (EM);  Surgeon: Napoleon Form, MD;  Location: [facility];  Service: Endoscopy;  Laterality: N/A;  . ESOPHAGOGASTRODUODENOSCOPY     x2 have been normal and gastric emptying has been normal   . ESOPHAGOGASTRODUODENOSCOPY    . gastric empyting study     . PH IMPEDANCE STUDY N/A 09/12/2018   Procedure: PH IMPEDANCE STUDY;  Surgeon: Napoleon Form, MD;  [facility];  Service: Endoscopy;  Laterality: N/A;    OB History   No obstetric history on file.      Home Medications    Prior to Admission medications   Medication Sig Start Date End Date Taking? Authorizing Provider  amphetamine-dextroamphetamine (ADDERALL XR) 15 MG 24 hr capsule Take 1 capsule by mouth daily. 02/21/20 02/20/21 Yes Zena Amos, MD  clobetasol (OLUX) 0.05 % topical foam Apply topically 2 (two) times daily. Patient taking differently: Apply topically as needed. 08/11/16  Yes Breeback, Jade L, PA-C  drospirenone-ethinyl estradiol (YAZ,GIANVI,LORYNA) 3-0.02 MG tablet Take 1 tablet by mouth daily. 08/20/17  Yes Breeback, Jade L, PA-C  hydrocortisone (ANUSOL-HC) 2.5 % rectal cream  Place 1 application rectally 2 (two) times daily. 10/11/18  Yes Breeback, Jade L, PA-C  hydrocortisone 2.5 % cream  07/27/19  Yes [provider]  ondansetron (ZOFRAN-ODT) 4 MG disintegrating tablet Take 1 tablet (4 mg total) by mouth every 8 (eight) hours as needed for nausea or vomiting. 08/16/19  Yes Early, Sung Amabile, NP  Probiotic Product (PROBIOTIC DAILY PO) Take by mouth.   Yes [provider]  promethazine (PHENERGAN) 25 MG suppository Place 1  suppository (25 mg total) rectally every 6 (six) hours as needed for nausea or vomiting. 10/11/18  Yes Breeback, Jade L, PA-C  spironolactone (ALDACTONE) 100 MG tablet Take 100 mg by mouth daily.   Yes [provider]  Alum Hydroxide-Mag Carbonate (GAVISCON PO) Take by mouth as needed.    [provider]  AMBULATORY NON FORMULARY MEDICATION FD Guard    [provider]  AMBULATORY NON FORMULARY MEDICATION as needed. zinc-carnosine    [provider]  amphetamine-dextroamphetamine (ADDERALL XR) 15 MG 24 hr capsule Take 1 capsule by mouth daily. 03/22/20   Zena Amos, MD  amphetamine-dextroamphetamine (ADDERALL XR) 15 MG 24 hr capsule Take 1 capsule by mouth every morning. 04/20/20   Zena Amos, MD  Calcipotriene-Betameth Diprop (ENSTILAR) 0.005-0.064 % FOAM Apply topically as needed.    [provider]  clonazePAM (KLONOPIN) 0.5 MG tablet as needed.     [provider]  Multiple Vitamin (MULTIVITAMIN) capsule Take 1 capsule by mouth daily.    [provider]    Family History Family History  Problem Relation Age of Onset  . Depression Mother   . Heart attack Father   . Diabetes Father        prediabetic  . Hypertension Father   . Hyperlipidemia Father   . Diabetes Paternal Grandmother   . Colon cancer Neg Hx   . Esophageal cancer Neg Hx   . Rectal cancer Neg Hx   . Stomach cancer Neg Hx     Social History Social History   Tobacco Use  . Smoking status: Never Smoker  . Smokeless tobacco: Never Used  Vaping Use  . Vaping Use: Never used  Substance Use Topics  . Alcohol use: Yes    Comment: Hard selzer once very 3 months. Cant tolerate alcoloh  . Drug use: No     Allergies   Doxycycline, Pristiq [desvenlafaxine], and Augmentin [amoxicillin-pot clavulanate]   Review of Systems Review of Systems  Constitutional: Negative for chills and fever.  HENT: Negative for congestion, ear pain and sore throat.   Eyes:  Negative for photophobia and visual disturbance.  Respiratory: Negative for cough.   Cardiovascular: Negative for chest pain and palpitations.  Gastrointestinal: Positive for nausea. Negative for diarrhea and vomiting.  Musculoskeletal: Negative for neck pain and neck stiffness.  Neurological: Positive for headaches. Negative for dizziness, tremors, seizures, syncope, facial asymmetry, speech difficulty, weakness, light-headedness and numbness.     Physical Exam Triage Vital Signs ED Triage Vitals  Enc Vitals Group     BP 04/17/20 1004 117/82     Pulse Rate 04/17/20 1004 96     Resp 04/17/20 1004 16     Temp 04/17/20 1004 98.2 F (36.8 C)     Temp Source 04/17/20 1004 Oral     SpO2 04/17/20 1004 100 %     Weight 04/17/20 0958 105 lb (47.6 kg)     Height 04/17/20 0958 5' 5.5" (1.664 m)     Head Circumference --  Peak Flow --      Pain Score 04/17/20 0956 5     Pain Loc --      Pain Edu? --      Excl. in GC? --    No data found.  Updated Vital Signs BP 117/82 (BP Location: Right Arm)   Pulse 96   Temp 98.2 F (36.8 C) (Oral)   Resp 16   Ht 5' 5.5" (1.664 m)   Wt 105 lb (47.6 kg)   LMP 03/27/2020   SpO2 100%   BMI 17.21 kg/m   Visual Acuity Right Eye Distance: 20/20 Left Eye Distance: 20/20 Bilateral Distance: 20/20 (with glasses)  Right Eye Near:   Left Eye Near:    Bilateral Near:     Physical Exam Vitals and nursing note reviewed.  Constitutional:      General: She is not in acute distress.    Appearance: She is well-developed and well-nourished. She is not ill-appearing, toxic-appearing or diaphoretic.  HENT:     Head: Normocephalic and atraumatic.     Right Ear: Tympanic membrane and ear canal normal.     Left Ear: Tympanic membrane and ear canal normal.     Nose:     Right Sinus: Frontal sinus tenderness present. No maxillary sinus tenderness.     Left Sinus: Frontal sinus tenderness present. No maxillary sinus tenderness.     Mouth/Throat:      Lips: Pink.     Mouth: Mucous membranes are moist.     Pharynx: Oropharynx is clear. Uvula midline. No pharyngeal swelling, oropharyngeal exudate, posterior oropharyngeal erythema or uvula swelling.     Comments: Mild tenderness to frontal and temporal areas of head. Mild tenderness over bilateral temporal arteries.  Eyes:     General: No visual field deficit.    Extraocular Movements: EOM normal.  Cardiovascular:     Rate and Rhythm: Normal rate and regular rhythm.  Pulmonary:     Effort: Pulmonary effort is normal.     Breath sounds: Normal breath sounds.  Musculoskeletal:        General: Normal range of motion.     Cervical back: Normal range of motion and neck supple. No rigidity.  Lymphadenopathy:     Cervical: No cervical adenopathy.  Skin:    General: Skin is warm and dry.  Neurological:     Mental Status: She is alert and oriented to person, place, and time.     GCS: GCS eye subscore is 4. GCS verbal subscore is 5. GCS motor subscore is 6.     Cranial Nerves: No cranial nerve deficit, dysarthria or facial asymmetry.     Sensory: No sensory deficit.     Coordination: Romberg sign negative.     Gait: Gait normal.  Psychiatric:        Mood and Affect: Mood and affect and mood normal.        Behavior: Behavior normal.      UC Treatments / Results  Labs (all labs ordered are listed, but only abnormal results are displayed) Labs Reviewed  C-REACTIVE PROTEIN  BASIC METABOLIC PANEL  SEDIMENTATION RATE  POCT CBC W AUTO DIFF ([facility])    EKG   Radiology No results found.  Procedures Procedures (including critical care time)  Medications Ordered in UC Medications  ketorolac (TORADOL) injection 60 mg (60 mg Intramuscular Given 04/17/20 1029)  dexamethasone (DECADRON) injection 10 mg (10 mg Intramuscular Given 04/17/20 1030)  metoCLOPramide (REGLAN) injection 5 mg (5 mg  Intramuscular Given 04/17/20 1030)    Initial Impression / Assessment and Plan / UC  Course  I have reviewed the triage vital signs and the nursing notes.  Pertinent labs & imaging results that were available during my care of the patient were reviewed by me and considered in my medical decision making (see chart for details).     toradol 60mg  IM, decadron 10mg  IM, and reglan 5mg  IM given in UC No change in HA, 5/10. Normal neuro exam Discussed CT scan due to persistent HA, pt declined at this time. Will wait on COVID and blood tests to come back Discussed symptoms that warrant emergent care in the ED. AVS given  Final Clinical Impressions(s) / UC Diagnoses   Final diagnoses:  Frontal headache  Temporal headache  Brain fog  Nausea without vomiting     Discharge Instructions      You may take 500mg  acetaminophen every 4-6 hours or in combination with ibuprofen 400-600mg  every 6-8 hours as needed for pain, inflammation, and fever.  Be sure to well hydrated with clear liquids and get at least 8 hours of sleep at night, preferably more while sick.   Please follow up with family medicine tomorrow or Friday if not improving.  Call 911 or have someone drive you to the hospital if symptoms significantly worsening- worsening pain especially with position changes like when lying down, change in vision, vomiting, weakness or numbness in arms or legs, change in balance, or other new concerning symptoms develop.      ED Prescriptions    None     PDMP not reviewed this encounter.   , 04/17/20 1151

## 2020-04-17 NOTE — Discharge Instructions (Signed)
  You may take 500mg  acetaminophen every 4-6 hours or in combination with ibuprofen 400-600mg  every 6-8 hours as needed for pain, inflammation, and fever.  Be sure to well hydrated with clear liquids and get at least 8 hours of sleep at night, preferably more while sick.   Please follow up with family medicine tomorrow or Friday if not improving.  Call 911 or have someone drive you to the hospital if symptoms significantly worsening- worsening pain especially with position changes like when lying down, change in vision, vomiting, weakness or numbness in arms or legs, change in balance, or other new concerning symptoms develop.

## 2020-04-18 LAB — BASIC METABOLIC PANEL
BUN/Creatinine Ratio: 8 (calc) (ref 6–22)
BUN: 6 mg/dL — ABNORMAL LOW (ref 7–25)
CO2: 27 mmol/L (ref 20–32)
Calcium: 9.3 mg/dL (ref 8.6–10.2)
Chloride: 104 mmol/L (ref 98–110)
Creat: 0.75 mg/dL (ref 0.50–1.10)
Glucose, Bld: 85 mg/dL (ref 65–99)
Potassium: 4.5 mmol/L (ref 3.5–5.3)
Sodium: 137 mmol/L (ref 135–146)

## 2020-04-18 LAB — SEDIMENTATION RATE: Sed Rate: 2 mm/h (ref 0–20)

## 2020-04-18 LAB — C-REACTIVE PROTEIN: CRP: 0.2 mg/L (ref <8.0)

## 2020-05-15 ENCOUNTER — Other Ambulatory Visit: Payer: Self-pay

## 2020-05-15 ENCOUNTER — Telehealth (INDEPENDENT_AMBULATORY_CARE_PROVIDER_SITE_OTHER): Payer: Federal, State, Local not specified - PPO | Admitting: Psychiatry

## 2020-05-15 ENCOUNTER — Encounter (HOSPITAL_COMMUNITY): Payer: Self-pay | Admitting: Psychiatry

## 2020-05-15 DIAGNOSIS — F410 Panic disorder [episodic paroxysmal anxiety] without agoraphobia: Secondary | ICD-10-CM | POA: Diagnosis not present

## 2020-05-15 DIAGNOSIS — F411 Generalized anxiety disorder: Secondary | ICD-10-CM | POA: Diagnosis not present

## 2020-05-15 DIAGNOSIS — F900 Attention-deficit hyperactivity disorder, predominantly inattentive type: Secondary | ICD-10-CM

## 2020-05-15 DIAGNOSIS — F515 Nightmare disorder: Secondary | ICD-10-CM

## 2020-05-15 DIAGNOSIS — F331 Major depressive disorder, recurrent, moderate: Secondary | ICD-10-CM

## 2020-05-15 MED ORDER — AMPHETAMINE-DEXTROAMPHET ER 20 MG PO CP24: 20.0000 mg | ORAL_CAPSULE | Freq: Every morning | ORAL | 0 refills | Status: DC

## 2020-05-15 NOTE — Progress Notes (Unsigned)
[place] MD OP Progress Note  Virtual Visit via Video Note  I connected with Andrea Baker on 05/15/20 at  4:00 PM EST by a video enabled telemedicine application and verified that I am speaking with the correct person using two identifiers.  Location: Patient: Home Provider: Clinic   I discussed the limitations of evaluation and management by telemedicine and the availability of in person appointments. The patient expressed understanding and agreed to proceed.  I provided 16 minutes of non-face-to-face time during this encounter.      05/15/2020 4:23 PM Andrea Baker  MRN:  [id number]  Chief Complaint: " Everything is the same."  HPI: Patient informed that nothing has really changed since she last spoke to the writer 3 months ago.  She informed that she still has nightmares and vivid dreams. She did look into seeing a therapist who offers hypnotherapy.  She did some online research and could not find anyone who really met all the criteria that she is looking for.   She informed that she stopped seeing her regular therapist, she had been with them for 3 years.   She has found a therapist who offers EMDR and has experience of working with people with similar symptoms like hers. However when they contacted her she was having a rough day because her beloved dog ran away from home on the same day. She stated that she has contacted them again and is hoping to hear back from them soon. She also saw a functional integrative nurse practitioner who has recommended several blood tests.  She is not sure if her Adderall is helping much or not.  She has a hard time staying concentrated and for itching on her work in a timely fashion.  She was agreeable to going up on the dose of Adderall xr to 20 mg.  Writer advised her to contact the clinic if she gets any concerning lab results back.     Visit Diagnosis:    ICD-10-CM   1. MDD (major depressive disorder), recurrent episode, moderate (HCC)  F33.1    2. GAD (generalized anxiety disorder)  F41.1   3. Panic attacks  F41.0   4. 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 Acne   . Anxiety and depression 05/02/2013   Intolerant to lexapro and effexor   . Constipation   . GERD 10/19/2009   Failed omeprazole and protonix.   Marland Kitchen GERD (gastroesophageal reflux disease)   . Mouth ulcer   . Psoriasis    of the scalp  . Wears contact lenses   . Wears glasses     Past Surgical History:  Procedure Laterality Date  . ESOPHAGEAL MANOMETRY N/A 09/12/2018   Procedure: ESOPHAGEAL MANOMETRY (EM);  Surgeon: Mauri Pole, MD;  [facility];  Service: Endoscopy;  Laterality: N/A;  . ESOPHAGOGASTRODUODENOSCOPY     x2 have been normal and gastric emptying has been normal   . ESOPHAGOGASTRODUODENOSCOPY    . gastric empyting study     . [place] IMPEDANCE STUDY N/A 09/12/2018   Procedure: [place] IMPEDANCE STUDY;  Surgeon: Mauri Pole, MD;  [facility];  Service: Endoscopy;  Laterality: N/A;    Family Psychiatric History: Mother- depression and is currently prescribed Effexor.  Mother also has been diagnosed with ADHD and has tried  Vyvanse in the past.  Family History:  Family History  Problem Relation Age of Onset  . Depression Mother   . Heart attack Father   . Diabetes Father        prediabetic  . Hypertension Father   . Hyperlipidemia Father   . Diabetes Paternal Grandmother   . Colon cancer Neg Hx   . Esophageal cancer Neg Hx   . Rectal cancer Neg Hx   . Stomach cancer Neg Hx     Social History:  Social History   Socioeconomic History  . Marital status: Single    Spouse name: Not on file  . Number of children: Not on file  . Years of education: Not on file  . Highest education level: Not on  file  Occupational History  . Not on file  Tobacco Use  . Smoking status: Never Smoker  . Smokeless tobacco: Never Used  Vaping Use  . Vaping Use: Never used  Substance and Sexual Activity  . Alcohol use: Yes    Comment: Hard selzer once very 3 months. Cant tolerate alcoloh  . Drug use: No  . Sexual activity: Not Currently  Other Topics Concern  . Not on file  Social History Narrative   LIVES WITH PARENTS.  STUDENT AT [employer]).     Social Determinants of Health   Financial Resource Strain: Not on file  Food Insecurity: Not on file  Transportation Needs: Not on file  Physical Activity: Not on file  Stress: Not on file  Social Connections: Not on file    Allergies:  Allergies  Allergen Reactions  . Doxycycline     Nausea and vomiting   . Pristiq [Desvenlafaxine]     Nausea/vomiting/headache  . Augmentin [Amoxicillin-Pot Clavulanate]     Vomiting when taken 10 years ago. Has taken amoxicillin without problems.    Metabolic Disorder Labs: No results found for: HGBA1C, MPG No results found for: PROLACTIN Lab Results  Component Value Date   CHOL 153 09/29/2013   TRIG 53 09/29/2013   HDL 47 09/29/2013   CHOLHDL 3.3 09/29/2013   VLDL 11 09/29/2013   LDLCALC 95 09/29/2013   Lab Results  Component Value Date   TSH 1.92 04/29/2018    Therapeutic Level Labs: No results found for: LITHIUM No results found for: VALPROATE No components found for:  CBMZ  Current Medications: Current Outpatient Medications  Medication Sig Dispense Refill  . Alum Hydroxide-Mag Carbonate (GAVISCON PO) Take by mouth as needed.    . AMBULATORY NON FORMULARY MEDICATION FD Guard    . AMBULATORY NON FORMULARY MEDICATION as needed. zinc-carnosine    . amphetamine-dextroamphetamine (ADDERALL XR) 15 MG 24 hr capsule Take 1 capsule by mouth daily. 30 capsule 0  . amphetamine-dextroamphetamine (ADDERALL XR) 15 MG 24 hr capsule Take 1 capsule by mouth daily. 30 capsule 0  .  amphetamine-dextroamphetamine (ADDERALL XR) 15 MG 24 hr capsule Take 1 capsule by mouth every morning. 30 capsule 0  . Calcipotriene-Betameth Diprop (ENSTILAR) 0.005-0.064 % FOAM Apply topically as needed.    . clobetasol (OLUX) 0.05 % topical foam Apply topically 2 (two) times daily. (Patient taking differently: Apply topically as needed.) 50 g 5  . clonazePAM (KLONOPIN) 0.5 MG tablet as needed.     . drospirenone-ethinyl estradiol (YAZ,GIANVI,LORYNA) 3-0.02 MG tablet Take 1 tablet by mouth daily. 30 tablet 11  . hydrocortisone (ANUSOL-HC) 2.5 % rectal cream Place 1 application rectally 2 (two) times daily. 28 g 2  . hydrocortisone 2.5 %  cream     . Multiple Vitamin (MULTIVITAMIN) capsule Take 1 capsule by mouth daily.    . ondansetron (ZOFRAN-ODT) 4 MG disintegrating tablet Take 1 tablet (4 mg total) by mouth every 8 (eight) hours as needed for nausea or vomiting. 30 tablet 5  . Probiotic Product (PROBIOTIC DAILY PO) Take by mouth.    . promethazine (PHENERGAN) 25 MG suppository Place 1 suppository (25 mg total) rectally every 6 (six) hours as needed for nausea or vomiting. 12 each 1  . spironolactone (ALDACTONE) 100 MG tablet Take 100 mg by mouth daily.     No current facility-administered medications for this visit.     Psychiatric Specialty Exam: Review of Systems    There were no vitals taken for this visit.There is no height or weight on file to calculate BMI.  General Appearance: well groomed  Eye Contact:  good  Speech:  Clear and Coherent and Normal Rate  Volume:  Normal  Mood:  Depressed  Affect:  Congruent  Thought Process:  Goal Directed and Descriptions of Associations: Intact  Orientation:  Full (Time, Place, and Person)  Thought Content: Logical   Suicidal Thoughts:  No  Homicidal Thoughts:  No  Memory:  Immediate;   Good Recent;   Good  Judgement:  Fair  Insight:  Fair  Psychomotor Activity:  Normal  Concentration:  Concentration: Good and Attention Span: Fair   Recall:  Good  Fund of Knowledge: Good  Language: Good  Akathisia:  Negative  Handed:  Right  AIMS (if indicated): not done  Assets:  Communication Skills Desire for Improvement Financial Resources/Insurance Housing  ADL's:  Intact  Cognition: WNL  Sleep:  Fair, continues to have frequent nightmares and vivid dreams   Screenings:  GAD-7   Flowsheet Row Video Visit from 02/15/2019 in [facility] Video Visit from 10/11/2018 in [place] Office Visit from 06/03/2018 in [place] Office Visit from 04/29/2018 in [place]  Total GAD-7 Score '19 18 15 16    ' PHQ2-9   [place] Office Visit from 03/30/2019 in [facility] Video Visit from 02/15/2019 in [place] Office Visit from 11/24/2018 in [facility] Video Visit from 10/11/2018 in [place] Office Visit from 06/03/2018 in [place]  PHQ-2 Total Score '3 5 6 4 6  ' PHQ-9 Total Score '11 21 23 18 20        ' Assessment and Plan: Patient remains somewhat depressed and continues to have nightmares and vivid dreams impacting her quality of sleep.  She has been seen by functional integrative nurse practitioner and is about to undergo work-up with them.  She is also looking into speaking to a different therapist who offers EMDR.   1. MDD (major depressive disorder), recurrent episode, moderate ([place]) - Has tried several different anti-depressants and mood stabilizers, but has not responded well and has developed adverse effects to majority of them.  2. GAD (generalized anxiety disorder)   3. Panic attacks   4. Attention deficit hyperactivity disorder (ADHD), predominantly inattentive type - Increase Adderall XR  20 mg qam.  5. Nightmare disorder  Patient was advised to contact the writer once she receives her lab results to discuss if there was anything concerning. F/up in 3 months.   [place] Crane, MD  05/15/2020, 4:23 PM

## 2020-05-29 DIAGNOSIS — F438 Other reactions to severe stress: Secondary | ICD-10-CM | POA: Diagnosis not present

## 2020-05-29 DIAGNOSIS — F419 Anxiety disorder, unspecified: Secondary | ICD-10-CM | POA: Diagnosis not present

## 2020-06-05 DIAGNOSIS — F419 Anxiety disorder, unspecified: Secondary | ICD-10-CM | POA: Diagnosis not present

## 2020-06-05 DIAGNOSIS — F438 Other reactions to severe stress: Secondary | ICD-10-CM | POA: Diagnosis not present

## 2020-06-13 DIAGNOSIS — F419 Anxiety disorder, unspecified: Secondary | ICD-10-CM | POA: Diagnosis not present

## 2020-06-13 DIAGNOSIS — F438 Other reactions to severe stress: Secondary | ICD-10-CM | POA: Diagnosis not present

## 2020-06-20 DIAGNOSIS — F438 Other reactions to severe stress: Secondary | ICD-10-CM | POA: Diagnosis not present

## 2020-06-20 DIAGNOSIS — F419 Anxiety disorder, unspecified: Secondary | ICD-10-CM | POA: Diagnosis not present

## 2020-06-27 DIAGNOSIS — F419 Anxiety disorder, unspecified: Secondary | ICD-10-CM | POA: Diagnosis not present

## 2020-06-27 DIAGNOSIS — F438 Other reactions to severe stress: Secondary | ICD-10-CM | POA: Diagnosis not present

## 2020-06-29 IMAGING — NM NUCLEAR MEDICINE HEPATOBILIARY IMAGING WITH GALLBLADDER EF
4 series · 14 of 14 positions shown · non-contrast
Comparison: None

CLINICAL DATA: Chronic nausea and vomiting, abdominal pain
increased with fatty foods and alcohol

EXAM:
NUCLEAR MEDICINE HEPATOBILIARY IMAGING WITH GALLBLADDER EF
TECHNIQUE: Sequential images of the abdomen were obtained [DATE] minutes
following intravenous administration of radiopharmaceutical. After
oral ingestion of Ensure, gallbladder ejection fraction was
determined. At 60 min, normal ejection fraction is greater than 33%.
RADIOPHARMACEUTICALS:  5.0 mCi Ec-PPm  Choletec IV

[Series 1: raw data · 4.46mm/px · 6 of 60 frames shown (1 of 4)]
[frame 6/60]
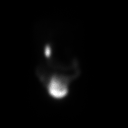
[frame 16/60]
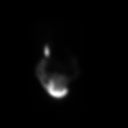
[frame 26/60]
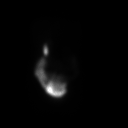
[frame 36/60]
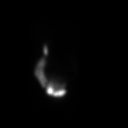
[frame 46/60]
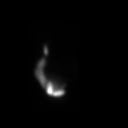
[frame 56/60]
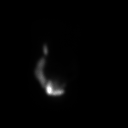

[Series 1: raw data · 4.46mm/px · 6 of 60 frames shown (2 of 4)]
[frame 6/60]
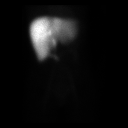
[frame 16/60]
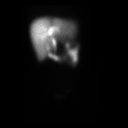
[frame 26/60]
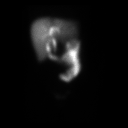
[frame 36/60]
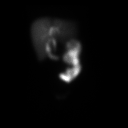
[frame 46/60]
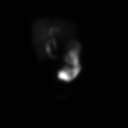
[frame 56/60]
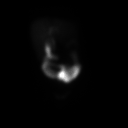

[Series 1: raw data · 2.23mm/px · 1 of 1 slices shown (3 of 4)]
[im 1/1]
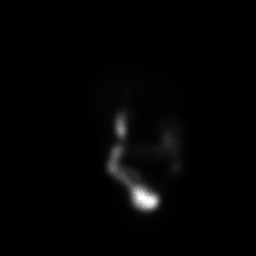

[Series 1: raw data · 2.23mm/px · 1 of 1 slices shown (4 of 4)]
[im 1/1]
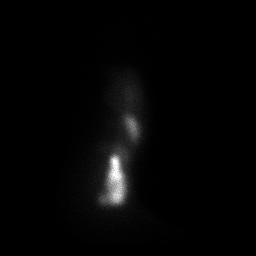

[14 of 14 positions shown; findings below may reference images not displayed]

FINDINGS: Normal tracer extraction from bloodstream indicating normal
hepatocellular function.

Normal excretion of tracer into biliary tree.

Gallbladder visualized at 18 minutes min.

Small bowel visualized at 8 minutes min.

No hepatic retention of tracer.

Subjectively normal emptying of tracer from gallbladder following
fatty meal stimulation.

Calculated gallbladder ejection fraction is 51%, normal.

Patient reported no symptoms following Ensure ingestion.

Normal gallbladder ejection fraction following Ensure ingestion is
greater than 33% at 1 hour.
IMPRESSION: Normal exam.

## 2020-07-04 DIAGNOSIS — F419 Anxiety disorder, unspecified: Secondary | ICD-10-CM | POA: Diagnosis not present

## 2020-07-04 DIAGNOSIS — F438 Other reactions to severe stress: Secondary | ICD-10-CM | POA: Diagnosis not present

## 2020-07-09 ENCOUNTER — Telehealth (HOSPITAL_COMMUNITY): Payer: Self-pay | Admitting: *Deleted

## 2020-07-09 NOTE — Telephone Encounter (Signed)
Obtained prior auth for patients amphetamine-dextroamphet ER. [facility] pharmacy notified. PA #[id number]

## 2020-07-11 DIAGNOSIS — F438 Other reactions to severe stress: Secondary | ICD-10-CM | POA: Diagnosis not present

## 2020-07-11 DIAGNOSIS — F419 Anxiety disorder, unspecified: Secondary | ICD-10-CM | POA: Diagnosis not present

## 2020-07-25 DIAGNOSIS — F419 Anxiety disorder, unspecified: Secondary | ICD-10-CM | POA: Diagnosis not present

## 2020-07-25 DIAGNOSIS — F438 Other reactions to severe stress: Secondary | ICD-10-CM | POA: Diagnosis not present

## 2020-08-01 DIAGNOSIS — F438 Other reactions to severe stress: Secondary | ICD-10-CM | POA: Diagnosis not present

## 2020-08-01 DIAGNOSIS — F419 Anxiety disorder, unspecified: Secondary | ICD-10-CM | POA: Diagnosis not present

## 2020-08-05 ENCOUNTER — Encounter: Payer: Self-pay | Admitting: Physician Assistant

## 2020-08-05 DIAGNOSIS — R634 Abnormal weight loss: Secondary | ICD-10-CM

## 2020-08-05 DIAGNOSIS — R5383 Other fatigue: Secondary | ICD-10-CM

## 2020-08-05 DIAGNOSIS — R6889 Other general symptoms and signs: Secondary | ICD-10-CM

## 2020-08-05 DIAGNOSIS — L309 Dermatitis, unspecified: Secondary | ICD-10-CM

## 2020-08-05 DIAGNOSIS — L409 Psoriasis, unspecified: Secondary | ICD-10-CM

## 2020-08-05 NOTE — Telephone Encounter (Signed)
Ok to order 

## 2020-08-08 DIAGNOSIS — L409 Psoriasis, unspecified: Secondary | ICD-10-CM | POA: Diagnosis not present

## 2020-08-08 DIAGNOSIS — L309 Dermatitis, unspecified: Secondary | ICD-10-CM | POA: Diagnosis not present

## 2020-08-08 DIAGNOSIS — R531 Weakness: Secondary | ICD-10-CM | POA: Diagnosis not present

## 2020-08-08 DIAGNOSIS — R5383 Other fatigue: Secondary | ICD-10-CM | POA: Diagnosis not present

## 2020-08-08 DIAGNOSIS — L400 Psoriasis vulgaris: Secondary | ICD-10-CM | POA: Diagnosis not present

## 2020-08-08 DIAGNOSIS — R634 Abnormal weight loss: Secondary | ICD-10-CM | POA: Diagnosis not present

## 2020-08-08 DIAGNOSIS — R6889 Other general symptoms and signs: Secondary | ICD-10-CM | POA: Diagnosis not present

## 2020-08-08 DIAGNOSIS — Z79899 Other long term (current) drug therapy: Secondary | ICD-10-CM | POA: Diagnosis not present

## 2020-08-08 DIAGNOSIS — Z111 Encounter for screening for respiratory tuberculosis: Secondary | ICD-10-CM | POA: Diagnosis not present

## 2020-08-09 NOTE — Telephone Encounter (Signed)
Andrea Baker,   Vitamin D still low. How much are you taking?  B12 looks great.  Thyroid perfect.  Iron panel great.

## 2020-08-12 ENCOUNTER — Encounter: Payer: Self-pay | Admitting: Physician Assistant

## 2020-08-23 LAB — THYROID PANEL WITH TSH
Free Thyroxine Index: 2.2 (ref 1.2–4.9)
T3 Uptake Ratio: 27 % (ref 24–39)
T4, Total: 8.3 ug/dL (ref 4.5–12.0)
TSH: 1.43 u[IU]/mL (ref 0.450–4.500)

## 2020-08-23 LAB — VITAMIN B12: Vitamin B-12: 613 pg/mL (ref 232–1245)

## 2020-08-23 LAB — FERRITIN: Ferritin: 142 ng/mL (ref 15–150)

## 2020-08-23 LAB — VITAMIN D 25 HYDROXY (VIT D DEFICIENCY, FRACTURES): Vit D, 25-Hydroxy: 21.9 ng/mL — ABNORMAL LOW (ref 30.0–100.0)

## 2020-08-23 LAB — IRON: Iron: 86 ug/dL (ref 27–159)

## 2020-08-23 LAB — VITAMIN B6: Vitamin B6: 81.4 ug/L — ABNORMAL HIGH (ref 3.4–65.2)

## 2020-08-23 NOTE — Telephone Encounter (Signed)
What B vitamins are you on? B6 is too high?

## 2020-08-26 ENCOUNTER — Encounter: Payer: Self-pay | Admitting: Physician Assistant

## 2020-08-26 NOTE — Telephone Encounter (Signed)
Do you drink energy drinks? Smoothies?

## 2020-08-27 ENCOUNTER — Other Ambulatory Visit: Payer: Self-pay | Admitting: Physician Assistant

## 2020-08-27 DIAGNOSIS — Z1589 Genetic susceptibility to other disease: Secondary | ICD-10-CM | POA: Insufficient documentation

## 2020-08-28 ENCOUNTER — Telehealth (HOSPITAL_COMMUNITY): Payer: Self-pay | Admitting: Psychiatry

## 2020-08-28 ENCOUNTER — Other Ambulatory Visit (HOSPITAL_COMMUNITY): Payer: Self-pay | Admitting: Psychiatry

## 2020-08-28 DIAGNOSIS — F900 Attention-deficit hyperactivity disorder, predominantly inattentive type: Secondary | ICD-10-CM

## 2020-08-28 MED ORDER — AMPHETAMINE-DEXTROAMPHET ER 20 MG PO CP24: 20.0000 mg | ORAL_CAPSULE | Freq: Every morning | ORAL | 0 refills | Status: DC

## 2020-08-28 NOTE — Telephone Encounter (Signed)
Adderall refilled and sent to preferred pharmacy.

## 2020-08-28 NOTE — Telephone Encounter (Signed)
Scheduled

## 2020-08-28 NOTE — Telephone Encounter (Signed)
13th at 10;30 make a 40 minute

## 2020-08-28 NOTE — Telephone Encounter (Signed)
Pt called to schedule appt 09/17/20 with Evelene Croon.  Pt states she will be out of refills before that appt.  Please send new prescription.

## 2020-08-29 DIAGNOSIS — F419 Anxiety disorder, unspecified: Secondary | ICD-10-CM | POA: Diagnosis not present

## 2020-08-29 DIAGNOSIS — F438 Other reactions to severe stress: Secondary | ICD-10-CM | POA: Diagnosis not present

## 2020-09-02 ENCOUNTER — Ambulatory Visit: Payer: Federal, State, Local not specified - PPO | Admitting: Physician Assistant

## 2020-09-02 ENCOUNTER — Other Ambulatory Visit: Payer: Self-pay

## 2020-09-02 VITALS — BP 126/81 | HR 106 | Ht 65.5 in | Wt 105.0 lb

## 2020-09-02 DIAGNOSIS — R634 Abnormal weight loss: Secondary | ICD-10-CM | POA: Diagnosis not present

## 2020-09-02 DIAGNOSIS — R413 Other amnesia: Secondary | ICD-10-CM

## 2020-09-02 DIAGNOSIS — F411 Generalized anxiety disorder: Secondary | ICD-10-CM

## 2020-09-02 DIAGNOSIS — E538 Deficiency of other specified B group vitamins: Secondary | ICD-10-CM

## 2020-09-02 DIAGNOSIS — R4189 Other symptoms and signs involving cognitive functions and awareness: Secondary | ICD-10-CM

## 2020-09-02 DIAGNOSIS — R41840 Attention and concentration deficit: Secondary | ICD-10-CM

## 2020-09-02 DIAGNOSIS — F515 Nightmare disorder: Secondary | ICD-10-CM | POA: Diagnosis not present

## 2020-09-02 DIAGNOSIS — F331 Major depressive disorder, recurrent, moderate: Secondary | ICD-10-CM

## 2020-09-02 DIAGNOSIS — F410 Panic disorder [episodic paroxysmal anxiety] without agoraphobia: Secondary | ICD-10-CM

## 2020-09-02 DIAGNOSIS — Z1589 Genetic susceptibility to other disease: Secondary | ICD-10-CM | POA: Diagnosis not present

## 2020-09-02 DIAGNOSIS — G4700 Insomnia, unspecified: Secondary | ICD-10-CM

## 2020-09-02 MED ORDER — CYANOCOBALAMIN 1000 MCG/ML IJ SOLN
1000.0000 ug | Freq: Once | INTRAMUSCULAR | Status: AC
  Administered 2020-09-02: 1000 ug via INTRAMUSCULAR

## 2020-09-02 NOTE — Patient Instructions (Signed)
Start b complex vitamin. Will make referrals.

## 2020-09-02 NOTE — Progress Notes (Signed)
Subjective:    Patient ID: Andrea Baker, female    DOB: 08-07-1990, 30 y.o.   MRN: [id number]  HPI Pt is a 30 yo female with MTHFR, GERD, Constipation, MDD, Anxiety, MDD, concentration deficit, memory issues who presents to the clinic to follow up.   Pt is very frustrated. Andrea Baker feels like Andrea Baker continues not gain weight and lose weight at times. Andrea Baker eats at least 1600 calories a day but at times 2300 calories a day with no weight gain. Her nausea is better on FDguard. Andrea Baker has seen GI and had EGD and needs colonoscopy.  Andrea Baker is seeing BH and see is in EMDR therapy. Andrea Baker does not feel like helping. Andrea Baker is on adderall with not much change. Andrea Baker continues to have brain fog and memory issues. Andrea Baker struggles to keep up with work demands. Andrea Baker has tried numerous anti-depressants with no benefit or side effects. Andrea Baker has done gene sight testing and has not seemed to help.   Andrea Baker went to functional NP with no answers. Andrea Baker did [place] screening but was on hormones.     .. 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   Insomnia 06/06/2018   Non-intractable vomiting    GAD (generalized anxiety disorder) 10/13/2018   MDD (major depressive disorder), recurrent episode, moderate (HCC) 10/13/2018   Dyspepsia 10/13/2018   Allergic rhinitis 08/07/2011   Psoriasis 10/24/2018   Attention deficit hyperactivity disorder (ADHD), 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     Objective:   Physical Exam Vitals reviewed.  Constitutional:      Appearance: Normal appearance.  HENT:     Head: Normocephalic.  Cardiovascular:     Rate and Rhythm: Normal rate and regular rhythm.     Pulses: Normal pulses.     Heart sounds: Normal heart sounds.  Pulmonary:     Effort: Pulmonary effort is normal.     Breath sounds: Normal breath sounds.  Neurological:     General: No focal deficit present.     Mental Status: Andrea Baker is alert and oriented to person, place, and time.  Psychiatric:        Mood and Affect: Mood normal.    .. Depression screen [facility] 2/9 02/15/2019 10/11/2018 06/03/2018 04/29/2018  Decreased Interest 2 1 3 2   Down, Depressed, Hopeless 3 3 3 3   PHQ - 2 Score 5 4 6 5   Altered sleeping 3 3 3 3   Tired, decreased energy 3 3 3 3   Change in appetite 1 1 1  0  Feeling bad or failure about yourself  3 3 3 2   Trouble concentrating 3 3 1  1  Moving slowly or fidgety/restless 3 1 2 2   Suicidal thoughts 0 0 1 0  PHQ-9 Score 21 18 20 16   Difficult doing work/chores Very difficult Very difficult Very difficult Very difficult  Some encounter information is confidential and restricted. Go to Review Flowsheets activity to see all data.   .. GAD 7 : Generalized Anxiety Score 02/15/2019 10/11/2018 06/03/2018 04/29/2018  Nervous, Anxious, on Edge 3 3 2 3   Control/stop worrying 3 3 2 3   Worry too much - different things 3 3 3 3   Trouble relaxing 3 3 2 2   Restless 1 3 0 0  Easily annoyed or irritable 3 1 3 2   Afraid - awful might happen 3 2 3 3   Total GAD 7 Score 19 18 15 16   Anxiety Difficulty Somewhat difficult Very difficult Very difficult Very difficult         Assessment & Plan:  3/15/20204/7/2020Alanda  was seen today for follow-up.  Diagnoses and all orders for this visit:  Unintentional weight loss -     Ambulatory referral to Endocrinology  Nightmare disorder -     Ambulatory referral to Neurology  B12 deficiency -     cyanocobalamin ((VITAMIN B-12)) injection 1,000 mcg  MTHFR gene mutation  Memory changes -     Ambulatory referral to Neurology  Brain fog -     Ambulatory referral to Neurology  GAD (generalized anxiety disorder)  MDD (major depressive disorder), recurrent episode, moderate (HCC)  Panic attacks  Concentration deficit  Insomnia, unspecified type  B12/B6 elevated but due to MTHFR gene fasley elevated. See what b12 shot if anything helps with energy/mood. Start b complex. Andrea Baker is on folic acid.   On adderall. Still struggles with memory, concentration, MDD, anxiety. Andrea Baker does not tolerate mood medications. See and list.  Referral placed for neurology.   Discussed unintentional weight loss. TSH normal range. Andrea Baker has been to GI with full work up. Will send to endocrinology.   Spent 30 minutes with patient discussing plan and new work up.

## 2020-09-05 DIAGNOSIS — F419 Anxiety disorder, unspecified: Secondary | ICD-10-CM | POA: Diagnosis not present

## 2020-09-05 DIAGNOSIS — F438 Other reactions to severe stress: Secondary | ICD-10-CM | POA: Diagnosis not present

## 2020-09-06 ENCOUNTER — Encounter: Payer: Self-pay | Admitting: Physician Assistant

## 2020-09-06 DIAGNOSIS — R413 Other amnesia: Secondary | ICD-10-CM | POA: Insufficient documentation

## 2020-09-06 DIAGNOSIS — R4189 Other symptoms and signs involving cognitive functions and awareness: Secondary | ICD-10-CM | POA: Insufficient documentation

## 2020-09-10 DIAGNOSIS — L400 Psoriasis vulgaris: Secondary | ICD-10-CM | POA: Diagnosis not present

## 2020-09-10 DIAGNOSIS — D1801 Hemangioma of skin and subcutaneous tissue: Secondary | ICD-10-CM | POA: Diagnosis not present

## 2020-09-10 DIAGNOSIS — L219 Seborrheic dermatitis, unspecified: Secondary | ICD-10-CM | POA: Diagnosis not present

## 2020-09-11 DIAGNOSIS — F438 Other reactions to severe stress: Secondary | ICD-10-CM | POA: Diagnosis not present

## 2020-09-11 DIAGNOSIS — F419 Anxiety disorder, unspecified: Secondary | ICD-10-CM | POA: Diagnosis not present

## 2020-09-12 ENCOUNTER — Ambulatory Visit (INDEPENDENT_AMBULATORY_CARE_PROVIDER_SITE_OTHER): Payer: Federal, State, Local not specified - PPO | Admitting: Neurology

## 2020-09-12 ENCOUNTER — Encounter: Payer: Self-pay | Admitting: Neurology

## 2020-09-12 ENCOUNTER — Other Ambulatory Visit: Payer: Self-pay

## 2020-09-12 VITALS — BP 120/81 | HR 84 | Ht 65.0 in | Wt 110.0 lb

## 2020-09-12 DIAGNOSIS — F515 Nightmare disorder: Secondary | ICD-10-CM | POA: Diagnosis not present

## 2020-09-12 DIAGNOSIS — F39 Unspecified mood [affective] disorder: Secondary | ICD-10-CM | POA: Diagnosis not present

## 2020-09-12 DIAGNOSIS — G4700 Insomnia, unspecified: Secondary | ICD-10-CM

## 2020-09-12 NOTE — Progress Notes (Signed)
Subjective:    Patient ID: Andrea Baker is a 30 y.o. female.  HPI    Andrea FoleySaima Joleene Burnham, MD, PhD [facility] [address], [address] [address] [place], [place] [id number]  Dear Andrea RubensteinJade,   I saw your patient, Andrea Baker, upon your kind request, in my sleep clinic today for initial consultation of her sleep disorder, in particular, her nightmares. The patient is unaccompanied today. As you know, Andrea Baker is a 30 year old right-handed woman with an underlying medical history of reflux disease, ADHD, psoriasis, constipation, anxiety, depression, vitamin D deficiency, and recent unintentional weight loss, who reports recurrent bad dreams and severe nightmares since 2019.  She reports a longstanding history of ADHD but she was not treated as a child.  She has been on Adderall XR for the past approximately 1 year, she follows with psychiatry on a regular basis.  She also reports trying several different antidepressant and anxiety medications, nothing helps consistently.  For sleep, she has tried prazosin for her nightmares.  She has also tried over-the-counter sleep aids including melatonin, valerian, hops, chamomile, lavender, essential oils.  She has trouble going to sleep and trouble staying asleep, she does not wake up rested.  Her sleep difficulty has been longstanding but significantly worse since 2019.  She has not noted any obvious trigger.  She does have a stressful job.  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  She is still suffering from difficulty with concentration and attention.  She reports brain fog.  She reports that her mother has a history of difficulty concentrating.  Patient has been taking over-the-counter supplements.  She has a bedtime generally between 12:30 AM and 1 AM and rise time between 630 and 7:15 AM.  She estimates that she gets an average of 5 or 6 hours of sleep on a given night with interruptions.  She typically recalls her nightmares.  She has been in therapy  with a counselor for over 1 year and then recently switched to another therapist.  She has seen GI and is supposed to see a new GI as I understand.  She has had trouble maintaining weight although she has a good appetite.  She tries to hydrate well with water but during the workday she may not drink enough.  She has been taking a vitamin D supplement and magnesium supplement.  On weekends she sleeps till 8:30 AM or 9 AM but does not necessarily feel any better rested.  She is not sure if she snores, her previous boyfriend did not complain about it.  She is single and lives alone, she has 1 dog in the household.  I reviewed your office note from 09/02/2020.  She had blood work through your office on 08/08/2020 and I reviewed the results: Vitamin B6 was elevated at 81.4, thyroid function test normal, vitamin D low at 21.9, Vitamin B12 was 613, ferritin 142, iron 86.  She has had occasional headaches.  She went to the emergency room with a headache in January 2022 and was treated symptomatically, she declined a CT of the head at the time.  I reviewed the emergency room records. She had a gastric emptying scan in August 2017 which was reported as normal.    For depression, she has been treated with TMS. Her psychiatrist tried her on prazosin but she had severe side effects.  She has also been on trazodone.  For her weight loss, she was tried on Remeron but she had side effects and she  stopped the medication.  She had seen Dr. Allena Katz in neurology in 2015 for left upper extremity paresthesias, EMG nerve conduction velocity testing from 01/10/2014 indicated left ulnar neuropathy.  She denies waking up with a sense of gasping for air.  Epworth sleepiness score is 10 out of 24, fatigue severity score is 59 out of 63.  Her Past Medical History Is Significant For: Past Medical History:  Diagnosis Date   Acne    Anxiety and depression 05/02/2013   Intolerant to lexapro and effexor    Constipation    GERD  10/19/2009   Failed omeprazole and protonix.    GERD (gastroesophageal reflux disease)    Mouth ulcer    Psoriasis    of the scalp   Wears contact lenses    Wears glasses     Her Past Surgical History Is Significant For: Past Surgical History:  Procedure Laterality Date   ESOPHAGEAL MANOMETRY N/A 09/12/2018   Procedure: ESOPHAGEAL MANOMETRY (EM);  Surgeon: Napoleon Form, MD;  Location: [facility];  Service: Endoscopy;  Laterality: N/A;   ESOPHAGOGASTRODUODENOSCOPY     x2 have been normal and gastric emptying has been normal    ESOPHAGOGASTRODUODENOSCOPY     gastric empyting study      PH IMPEDANCE STUDY N/A 09/12/2018   Procedure: PH IMPEDANCE STUDY;  Surgeon: Napoleon Form, MD;  Location: [facility];  Service: Endoscopy;  Laterality: N/A;    Her Family History Is Significant For: Family History  Problem Relation Age of Onset   Depression Mother    Heart attack Father    Diabetes Father        prediabetic   Hypertension Father    Hyperlipidemia Father    Diabetes Paternal Grandmother    Colon cancer Neg Hx    Esophageal cancer Neg Hx    Rectal cancer Neg Hx    Stomach cancer Neg Hx     Her Social History Is Significant For: Social History   Socioeconomic History   Marital status: Single    Spouse name: Not on file   Number of children: Not on file   Years of education: Not on file   Highest education level: Not on file  Occupational History   Not on file  Tobacco Use   Smoking status: Never   Smokeless tobacco: Never  Vaping Use   Vaping Use: Never used  Substance and Sexual Activity   Alcohol use: Yes    Comment: Hard selzer once very 3 months. Cant tolerate alcoloh   Drug use: No   Sexual activity: Not Currently  Other Topics Concern   Not on file  Social History Narrative   LIVES WITH PARENTS.  STUDENT AT [employer]     Social Determinants of Health   Financial Resource Strain: Not on file  Food Insecurity: Not on file   Transportation Needs: Not on file  Physical Activity: Not on file  Stress: Not on file  Social Connections: Not on file    Her Allergies Are:  Allergies  Allergen Reactions   Doxycycline     Nausea and vomiting    Pristiq [Desvenlafaxine]     Nausea/vomiting/headache   Augmentin [Amoxicillin-Pot Clavulanate]     Vomiting when taken 10 years ago. Has taken amoxicillin without problems.  :   Her Current Medications Are:  Outpatient Encounter Medications as of 09/12/2020  Medication Sig   Alum Hydroxide-Mag Carbonate (GAVISCON PO) Take by mouth as needed.   AMBULATORY NON FORMULARY MEDICATION FD  Guard  Mega Mucosa Candi Bactin   amphetamine-dextroamphetamine (ADDERALL XR) 20 MG 24 hr capsule Take 1 capsule (20 mg total) by mouth in the morning.   B Complex Vitamins (VITAMIN B-COMPLEX PO) Take by mouth.   clonazePAM (KLONOPIN) 0.5 MG tablet as needed.    Halobetasol Propionate (LEXETTE) 0.05 % FOAM Apply topically.   hydrocortisone (ANUSOL-HC) 2.5 % rectal cream Place 1 application rectally 2 (two) times daily.   hydrocortisone 2.5 % cream    MAGNESIUM PO Take by mouth.   ondansetron (ZOFRAN-ODT) 4 MG disintegrating tablet Take 1 tablet (4 mg total) by mouth every 8 (eight) hours as needed for nausea or vomiting.   Probiotic Product (PROBIOTIC DAILY PO) Take by mouth.   promethazine (PHENERGAN) 25 MG suppository Place 1 suppository (25 mg total) rectally every 6 (six) hours as needed for nausea or vomiting.   spironolactone (ALDACTONE) 100 MG tablet Take 100 mg by mouth daily.   VITAMIN D PO Take 2,000 Units by mouth daily.   [DISCONTINUED] clobetasol (OLUX) 0.05 % topical foam Apply topically 2 (two) times daily. (Patient not taking: Reported on 09/12/2020)   No facility-administered encounter medications on file as of 09/12/2020.  :   Review of Systems:  Out of a complete 14 point review of systems, all are reviewed and negative with the exception of these symptoms as listed  below:  Review of Systems  Neurological:        Here for sleep consult. No prior sleep study. Nightmares are prevalent and have been present almost every night since 2019. Strong history of memory fog and daytime fatigue.   Epworth Sleepiness Scale 0= would never doze 1= slight chance of dozing 2= moderate chance of dozing 3= high chance of dozing  Sitting and reading:3 Watching TV:2 Sitting inactive in a public place (ex. Theater or meeting):0 As a passenger in a car for an hour without a break:2 Lying down to rest in the afternoon:3 Sitting and talking to someone:0 Sitting quietly after lunch (no alcohol):0 In a car, while stopped in traffic:0 Total:10    Objective:  Neurological Exam  Physical Exam Physical Examination:   Vitals:   09/12/20 1112  BP: 120/81  Pulse: 84    General Examination: The patient is a very pleasant 30 y.o. female in no acute distress. She appears well-developed and well-nourished and well groomed.   HEENT: Normocephalic, atraumatic, pupils are equal, round and reactive to light, extraocular tracking is good without limitation to gaze excursion or nystagmus noted. Hearing is grossly intact. Face is symmetric with normal facial animation. Speech is clear with no dysarthria noted. There is no hypophonia. There is no lip, neck/head, jaw or voice tremor. Neck is supple with full range of passive and active motion. There are no carotid bruits on auscultation. Oropharynx exam reveals: moderate mouth dryness, adequate dental hygiene with no significant airway crowding noted, small airway entry, tonsils small, uvula small, Mallampati class I.  Neck circumference of 11-1/8 inches. Tongue protrudes centrally and palate elevates symmetrically.    Chest: Clear to auscultation without wheezing, rhonchi or crackles noted.  Heart: S1+S2+0, regular and normal without murmurs, rubs or gallops noted.   Abdomen: Soft, non-tender and non-distended with normal bowel  sounds appreciated on auscultation.  Extremities: There is no obvious edema in the distal lower extremities bilaterally. Slender ankles.   Skin: Warm and dry without trophic changes noted.   Musculoskeletal: exam reveals no obvious joint deformities, tenderness or joint swelling or erythema.  Neurologically:  Mental status: The patient is awake, alert and oriented in all 4 spheres. Her immediate and remote memory, attention, language skills and fund of knowledge are appropriate. There is no evidence of aphasia, agnosia, apraxia or anomia. Speech is clear with normal prosody and enunciation. Thought process is linear. Mood is normal and affect is normal.  Cranial nerves II - XII are as described above under HEENT exam.  Motor exam: Normal bulk, strength and tone is noted. There is a slight bilateral hand tremor.  Fine motor skills and coordination: grossly intact.  Cerebellar testing: No dysmetria or intention tremor. There is no truncal or gait ataxia.  Sensory exam: intact to light touch in the upper and lower extremities.  Gait, station and balance: She stands easily. No veering to one side is noted. No leaning to one side is noted. Posture is age-appropriate and stance is narrow based. Gait shows normal stride length and normal pace. No problems turning are noted.   Assessment and Plan:   In summary, Junell Cullifer is a very pleasant 30 y.o.-year old female with an underlying medical history of reflux disease, ADHD, psoriasis, constipation, anxiety, depression, vitamin D deficiency, and recent unintentional weight loss, who presents for evaluation of her sleep disturbance, in particular, difficulty initiating and maintaining sleep, nightmares for the past approximately 3 years.  She has experienced difficulty on an ongoing basis with focus and attention and concentration.  Adderall long-acting has helped but she reports having some side effects on it including grinding her teeth.  She has seen  different counselors and is currently seeing Dr. Evelene Croon in psychiatry.  We talked about the length of sleep disturbances with underlying mood disorders.  She has tried and failed multiple medications for anxiety and depression as well as for her nightmares.  Her situation is complex.  She has had GI related symptoms including weight loss, nausea and vomiting, difficulty with weight maintenance although she has a good appetite.  She has seen at least one GI specialist and had testing.  She is advised to talk to her psychiatrist about further evaluation of her ADHD with the help of the neuropsychologist for more in-depth cognitive testing.  She has an appointment coming up with her psychiatrist and will bring this up.  She had extensive blood work done.  I suggested we proceed with a sleep study to see if there is an obvious organic underlying sleep disorder.  Nightmare disorder is often linked to underlying stressors and mood disorders, she is well aware of this.  Unfortunately, there are not a lot of treatment options and she has tried multiple medications already.  She takes clonazepam very sparingly at this time.  She has worked on Physiological scientist.  She has tried over-the-counter medications including melatonin and other supplements as listed above.  We will proceed with a sleep study and plan a follow-up in this clinic accordingly.  We will keep her posted as to her test results by phone call as well.  She is advised to keep her appointments as scheduled with her counselor and her psychiatrist.  I answered all her questions today and she was in agreement with the plan.  Thank you very much for allowing me to participate in the care of this nice patient. If I can be of any further assistance to you please do not hesitate to call me at [phone].  Sincerely,   Andrea Foley, MD, PhD

## 2020-09-12 NOTE — Patient Instructions (Signed)
It was nice to meet you today.  As discussed, I recommend that we proceed with a sleep study to rule out an underlying organic cause of his sleep disturbance.  If your Sleep study shows obstructive sleep apnea, we can consider treatment with a CPAP machine or AutoPap machine.  You have tried several different medications for your nightmares and your mood disorder, unfortunately, nothing has helped consistently.   As far as your concentration and attention problems and brain fog, please talk to your psychiatrist about seeing a neuropsychologist for further, in-depth cognitive evaluation.   The risks and ramifications of moderate to severe obstructive sleep apnea or OSA are: Cardiovascular disease, including congestive heart failure, stroke, difficult to control hypertension, arrhythmias, and even type 2 diabetes has been linked to untreated OSA. Sleep apnea causes disruption of sleep and sleep deprivation in most cases, which, in turn, can cause recurrent headaches, problems with memory, mood, concentration, focus, and vigilance. Most people with untreated sleep apnea report excessive daytime sleepiness, which can affect their ability to drive. Please do not drive if you feel sleepy.   We will call you after your sleep study to advise about the results (most likely, you will hear from [facility], my nurse).    Our sleep lab administrative assistant, will call you to schedule your sleep study. If you don't hear back from her by about 2 weeks from now, please feel free to call her at [phone]. This is her direct line and please leave a message with your phone number to call back if you get the voicemail box. She will call back as soon as possible.

## 2020-09-17 ENCOUNTER — Encounter (HOSPITAL_COMMUNITY): Payer: Self-pay | Admitting: Psychiatry

## 2020-09-17 ENCOUNTER — Telehealth (INDEPENDENT_AMBULATORY_CARE_PROVIDER_SITE_OTHER): Payer: Federal, State, Local not specified - PPO | Admitting: Psychiatry

## 2020-09-17 ENCOUNTER — Other Ambulatory Visit: Payer: Self-pay

## 2020-09-17 DIAGNOSIS — F410 Panic disorder [episodic paroxysmal anxiety] without agoraphobia: Secondary | ICD-10-CM

## 2020-09-17 DIAGNOSIS — F331 Major depressive disorder, recurrent, moderate: Secondary | ICD-10-CM | POA: Diagnosis not present

## 2020-09-17 DIAGNOSIS — F900 Attention-deficit hyperactivity disorder, predominantly inattentive type: Secondary | ICD-10-CM

## 2020-09-17 DIAGNOSIS — F515 Nightmare disorder: Secondary | ICD-10-CM

## 2020-09-17 DIAGNOSIS — F411 Generalized anxiety disorder: Secondary | ICD-10-CM

## 2020-09-17 MED ORDER — AMPHETAMINE-DEXTROAMPHETAMINE 10 MG PO TABS: 10.0000 mg | ORAL_TABLET | Freq: Three times a day (TID) | ORAL | 0 refills | Status: DC

## 2020-09-17 NOTE — Progress Notes (Signed)
BH MD OP Progress Note  Virtual Visit via Video Note  I connected with Andrea Baker on 09/17/20 at 10:00 AM EDT by a video enabled telemedicine application and verified that I am speaking with the correct person using two identifiers.  Location: Patient: Home Provider: Clinic   I discussed the limitations of evaluation and management by telemedicine and the availability of in person appointments. The patient expressed understanding and agreed to proceed.  I provided 17 minutes of non-face-to-face time during this encounter.  Extensive amount of time was spent in reviewing her chart including the evaluation by neurologist.      09/17/2020 10:59 AM Andrea Baker  MRN:  [id number]  Chief Complaint: "  I am the same."  HPI: Patient stated that she is not really doing too well.  She stated that she does not think Adderall XR 20 mg dose is helping her much.  She stated that lately she has become very absent-minded.  She gave examples of how she went to pump gas and when she pulled her and she could not remember what she was post to do.  She put the pump in her gas and then sat outside her car to clear out her trash however she did not know what she was supposed to do and she just sat there for a few minutes and then all of a sudden the tach was full.  She stated that she has no recollection of what happened in those few minutes.  Similarly she put chicken on the gas stove in her kitchen but totally forgot about it and then she had a lot of smoke coming out.  There was another incident when she thought she is going to shower and she turned on the water but totally forgot about it and then around 2 hours later when she remembered she went inside and found the water to be running.  She stated that she feels that she cannot function anymore and sometimes she feels like she has no brain activity.  She feels that she is losing time. She also stated that she started to have trouble at work in keeping up  with her notes and as result her supervisor is getting on her case again. She also complained of having frequent headaches. She stated that she feels frustrated because she cannot get anything done. She stated that with the Adderall XR 20 mg dose she still has some occasional clenching of her jaws and she has never gotten used to that.  As per EMR, she saw neurologist and sleep specialist Dr. Frances Furbish last week.  No neurological deficits were noted on examination they recommended that she undergo sleep study to ensure there was no organic underlying cause of her nightmares although they believe that her underlying mood and anxiety issues are contributing to her presentation.  Epworth sleepiness score is 10 out of 24 and fatigue severity score is 59 out of 63.  Patient mentioned that Dr. Frances Furbish advised her to consider seeing a neuropsychiatrist.  Writer educated the patient about neuropsychiatrist and their training background.  Writer googled some options and found 1 in [place] and [place] area.  Patient stated that she will look into that.  In the interim, writer recommended that we can try switching her to immediate release Adderall to see if that would help minimize the headaches and also reduce the jaw clenching.  Writer recommended that she tries Adderall 10 mg dose twice a day.  If she thinks that that dose  is not sufficiently she can take 2 tablets in the morning and 1 in the afternoon.  Or she can try taking 1 tablet 3 times a day.  Writer told her that since she is going to be using an immediate release form she will be able to see if it is effective or not in an almost instantaneous manner. Patient was agreeable to trying that.  She recently tried EMDR however did not find that to be helpful at all.  Past medications tried: Lexapro (2012)- made symptoms worse, switched to Wellbutrin-which helped and she continue taking the medicine to early 2016.  She weaned herself off of it and did well  until 2019.  After relapse of depressive symptoms around August, 2019 she discussed this with her PCP who prescribed her with Paxil in early 2020 which made her constipation worse.  She was also prescribed trazodone for sleep which also made her constipation worse.  She was then prescribed Trintellix which did not help and caused GI side effects.  She was then prescribed Effexor which did help her focus better and she was able to get more work done but caused significant constipation.  She took it for about a month.  In the interim she was prescribed clonazepam 0.5 mg for as needed use for anxiety and panic attacks.  She was then prescribed mirtazapine 7.5 mg at bedtime which did not cause significant GI effects but did increase her appetite significantly and she could not tell much of a difference in her mood.  She was then referred for TMS which she started in September 2020 and completed in November 2020. She underwent pharmacogenetics testing last year as per which Pristiq and Fetzima were noted to be in the green zone.  She tried Pristiq in December 2020 but after trying it for 1 night she developed significant headache leg weakness and tachycardia.  She had to visit the urgent care on the next morning and took her 3 days to recover completely. Tried lamotrigine in February 2021-caused her to have more frequent nightmares and dreams.  Also tried temazepam but that also did not help much with the nightmares and quality of sleep. Prazosin- took for over 2 years for nightmares, did not find to be fully effective-continue to have nightmares. Tried Clonidine in April 2021 for nightmares after being off prazosin.  However clonidine made her have more vivid dreams and nightmares and therefore she stopped taking it. Tried tizanidine 2 mg HS due to case reports of it being effective for nightmares but did not find it to be helpful at all. Tried Lunesta 1 mg at bedtime however caused her to have severe  headaches. Started on Adderall XR 10 mg in May 2021, found to be effective initially however after some point of time it stopped being effective.  Dose was increased to 20 mg eventually however she noticed it caused her to have significant jaw clenching.  Dose was reduced back to 10 mg however after some time it was ineffective so the dose was increased to 20 mg.  However eventually the medicine was not as effective and therefore she was switched to immediate release form to see if that would help her without any adverse reactions. Latest tried EMDR but did not find it to be helpful. She has also tried over-the-counter sleep aids including melatonin, valerian, hops, chamomile, lavender, essential oils.   She has not been tried on any TCAs because of chronic constipation.     Visit Diagnosis:  ICD-10-CM   1. Attention deficit hyperactivity disorder (ADHD), predominantly inattentive type  F90.0 amphetamine-dextroamphetamine (ADDERALL) 10 MG tablet    amphetamine-dextroamphetamine (ADDERALL) 10 MG tablet    amphetamine-dextroamphetamine (ADDERALL) 10 MG tablet    2. MDD (major depressive disorder), recurrent episode, moderate (HCC)  F33.1     3. GAD (generalized anxiety disorder)  F41.1     4. 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    GERD (gastroesophageal reflux disease)    Mouth ulcer    Psoriasis    of the scalp   Wears contact lenses    Wears glasses     Past Surgical History:  Procedure Laterality Date   ESOPHAGEAL MANOMETRY N/A 09/12/2018   Procedure: ESOPHAGEAL MANOMETRY (EM);  Surgeon: Napoleon FormNandigam, Kavitha V, MD;  Location: [facility];  Service: Endoscopy;  Laterality:  N/A;   ESOPHAGOGASTRODUODENOSCOPY     x2 have been normal and gastric emptying has been normal    ESOPHAGOGASTRODUODENOSCOPY     gastric empyting study      PH IMPEDANCE STUDY N/A 09/12/2018   Procedure: PH IMPEDANCE STUDY;  Surgeon: Napoleon FormNandigam, Kavitha V, MD;  Location: [facility];  Service: Endoscopy;  Laterality: N/A;    Family Psychiatric History: Mother- depression and is currently prescribed Effexor.  Mother also has been diagnosed with ADHD and has tried Vyvanse in the past.  Family History:  Family History  Problem Relation Age of Onset   Depression Mother    Heart attack Father    Diabetes Father        prediabetic   Hypertension Father    Hyperlipidemia Father    Diabetes Paternal Grandmother    Colon cancer Neg Hx    Esophageal cancer Neg Hx    Rectal cancer Neg Hx    Stomach cancer Neg Hx     Social History:  Social History   Socioeconomic History   Marital status: Single    Spouse name: Not on file   Number of children: Not on file   Years of education: Not on file   Highest education level: Not on file  Occupational History   Not on file  Tobacco Use   Smoking status: Never   Smokeless tobacco: Never  Vaping Use   Vaping Use: Never used  Substance and Sexual Activity   Alcohol use: Yes    Comment: Hard selzer once very 3 months. Cant tolerate alcoloh   Drug use: No   Sexual activity: Not Currently  Other Topics Concern   Not on file  Social History Narrative   LIVES WITH PARENTS.  [facility] [employer]).     Social Determinants of Health   Financial Resource Strain: Not on file  Food Insecurity: Not on file  Transportation Needs: Not on file  Physical Activity: Not on file  Stress: Not on file  Social Connections: Not on file    Allergies:  Allergies  Allergen Reactions   Doxycycline     Nausea and vomiting    Pristiq [Desvenlafaxine]     Nausea/vomiting/headache   Augmentin [Amoxicillin-Pot Clavulanate]     Vomiting when taken 10  years  ago. Has taken amoxicillin without problems.    Metabolic Disorder Labs: No results found for: HGBA1C, MPG No results found for: PROLACTIN Lab Results  Component Value Date   CHOL 153 09/29/2013   TRIG 53 09/29/2013   HDL 47 09/29/2013   CHOLHDL 3.3 09/29/2013   VLDL 11 09/29/2013   LDLCALC 95 09/29/2013   Lab Results  Component Value Date   TSH 1.430 08/08/2020   TSH 1.92 04/29/2018    Therapeutic Level Labs: No results found for: LITHIUM No results found for: VALPROATE No components found for:  CBMZ  Current Medications: Current Outpatient Medications  Medication Sig Dispense Refill   amphetamine-dextroamphetamine (ADDERALL) 10 MG tablet Take 1 tablet (10 mg total) by mouth in the morning, at noon, and at bedtime. 90 tablet 0   [START ON 10/16/2020] amphetamine-dextroamphetamine (ADDERALL) 10 MG tablet Take 1 tablet (10 mg total) by mouth in the morning, at noon, and at bedtime. 90 tablet 0   [START ON 11/15/2020] amphetamine-dextroamphetamine (ADDERALL) 10 MG tablet Take 1 tablet (10 mg total) by mouth in the morning, at noon, and at bedtime. 90 tablet 0   Alum Hydroxide-Mag Carbonate (GAVISCON PO) Take by mouth as needed.     AMBULATORY NON FORMULARY MEDICATION FD Guard  Mega Mucosa Candi Bactin     B Complex Vitamins (VITAMIN B-COMPLEX PO) Take by mouth.     clonazePAM (KLONOPIN) 0.5 MG tablet as needed.      Halobetasol Propionate (LEXETTE) 0.05 % FOAM Apply topically.     hydrocortisone (ANUSOL-HC) 2.5 % rectal cream Place 1 application rectally 2 (two) times daily. 28 g 2   hydrocortisone 2.5 % cream      MAGNESIUM PO Take by mouth.     ondansetron (ZOFRAN-ODT) 4 MG disintegrating tablet Take 1 tablet (4 mg total) by mouth every 8 (eight) hours as needed for nausea or vomiting. 30 tablet 5   Probiotic Product (PROBIOTIC DAILY PO) Take by mouth.     promethazine (PHENERGAN) 25 MG suppository Place 1 suppository (25 mg total) rectally every 6 (six) hours as  needed for nausea or vomiting. 12 each 1   spironolactone (ALDACTONE) 100 MG tablet Take 100 mg by mouth daily.     VITAMIN D PO Take 2,000 Units by mouth daily.     No current facility-administered medications for this visit.     Psychiatric Specialty Exam: Review of Systems    There were no vitals taken for this visit.There is no height or weight on file to calculate BMI.  General Appearance: well groomed  Eye Contact:  good  Speech:  Clear and Coherent and Normal Rate  Volume:  Normal  Mood:  Depressed  Affect:  Congruent  Thought Process:  Goal Directed and Descriptions of Associations: Intact  Orientation:  Full (Time, Place, and Person)  Thought Content: Logical   Suicidal Thoughts:  No  Homicidal Thoughts:  No  Memory:  Immediate;   Good Recent;   Good  Judgement:  Fair  Insight:  Fair  Psychomotor Activity:  Normal  Concentration:  Concentration: Good and Attention Span: Fair  Recall:  Good  Fund of Knowledge: Good  Language: Good  Akathisia:  Negative  Handed:  Right  AIMS (if indicated): not done  Assets:  Communication Skills Desire for Improvement Financial Resources/Insurance Housing  ADL's:  Intact  Cognition: WNL  Sleep:  Fair, continues to have frequent nightmares and vivid dreams   Screenings:  GAD-7    Flowsheet Row Video Visit  from 02/15/2019 in [facility] Video Visit from 10/11/2018 in [facility] Office Visit from 06/03/2018 in [facility] Office Visit from 04/29/2018 in [facility]  Total GAD-7 Score 19 18 15 16       PHQ2-9    Flowsheet Row Video Visit from 09/17/2020 in [facility] Office Visit from 03/30/2019 in [facility] Video Visit from 02/15/2019 in [facility] Office Visit from [facility]2018 [facility] Video Visit from 10/11/2018 in [facility]  PHQ-2 Total Score 4 3 5 6 4   PHQ-9 Total Score 16 11 21 23 18       Flowsheet Row Video Visit from 09/17/2020 in [facility]  C-SSRS RISK CATEGORY No Risk         Assessment and Plan: Patient continues to endorse depressive symptoms and is also noticing a lot of forgetfulness and losing time.  She is not sure of Adderall XR 20 mg is doing anything for her anymore.  She also complained of having a lot of headaches and some jaw clenching with it.  She is agreeable to switching to immediate release form of Adderall to see if that would help her better with her symptoms of poor concentration and focusing. Potential side effects of medication and risks vs benefits of treatment vs non-treatment were explained and discussed. All questions were answered.   1. MDD (major depressive disorder), recurrent episode, moderate (HCC) - Has tried numerous different anti-depressants and mood stabilizers, but has not responded well and has developed adverse effects to majority of them.  2. GAD (generalized anxiety disorder)   3. Panic attacks - Uses clonazepam 0.5 mg very sparingly  4. Attention deficit hyperactivity disorder (ADHD), predominantly inattentive type  - Discontinue Adderall XR 20 mg due to lack of efficacy, jaw clenching and frequent severe headaches. - amphetamine-dextroamphetamine (ADDERALL) 10 MG tablet; Take 1 tablet (10 mg total) by mouth in the morning, at noon, and at bedtime.  Dispense: 90 tablet; Refill: 0 - amphetamine-dextroamphetamine (ADDERALL) 10 MG tablet; Take 1 tablet (10 mg total) by mouth in the morning, at noon, and at bedtime.  Dispense: 90 tablet; Refill: 0 - amphetamine-dextroamphetamine (ADDERALL) 10 MG tablet; Take 1 tablet (10 mg total) by mouth in the morning, at noon, and at bedtime.  Dispense: 90 tablet;  Refill: 0  5. Nightmare disorder - Recently seen by Dr. , neurologist and sleep medicine specialist.  They have recommended a sleep study.  Writer informed patient that 09/19/2020 is leaving the [facility] health system and therefore she will be transferred to another psychiatrist within the system.  She was given information about Dr. Frances Furbish.  She was informed that their office staff will be contacting her later today for an appointment for her in 3 months.  Patient verbalized  her understanding.    Clinical research associate, MD 09/17/2020, 10:59 AM

## 2020-09-19 ENCOUNTER — Encounter: Payer: Self-pay | Admitting: Neurology

## 2020-09-19 DIAGNOSIS — F419 Anxiety disorder, unspecified: Secondary | ICD-10-CM | POA: Diagnosis not present

## 2020-09-19 DIAGNOSIS — F438 Other reactions to severe stress: Secondary | ICD-10-CM | POA: Diagnosis not present

## 2020-09-26 DIAGNOSIS — F438 Other reactions to severe stress: Secondary | ICD-10-CM | POA: Diagnosis not present

## 2020-09-26 DIAGNOSIS — F419 Anxiety disorder, unspecified: Secondary | ICD-10-CM | POA: Diagnosis not present

## 2020-10-10 DIAGNOSIS — F438 Other reactions to severe stress: Secondary | ICD-10-CM | POA: Diagnosis not present

## 2020-10-10 DIAGNOSIS — F419 Anxiety disorder, unspecified: Secondary | ICD-10-CM | POA: Diagnosis not present

## 2020-10-17 DIAGNOSIS — F438 Other reactions to severe stress: Secondary | ICD-10-CM | POA: Diagnosis not present

## 2020-10-17 DIAGNOSIS — F419 Anxiety disorder, unspecified: Secondary | ICD-10-CM | POA: Diagnosis not present

## 2020-10-24 DIAGNOSIS — F438 Other reactions to severe stress: Secondary | ICD-10-CM | POA: Diagnosis not present

## 2020-10-24 DIAGNOSIS — F419 Anxiety disorder, unspecified: Secondary | ICD-10-CM | POA: Diagnosis not present

## 2020-10-31 DIAGNOSIS — F438 Other reactions to severe stress: Secondary | ICD-10-CM | POA: Diagnosis not present

## 2020-10-31 DIAGNOSIS — F419 Anxiety disorder, unspecified: Secondary | ICD-10-CM | POA: Diagnosis not present

## 2020-11-10 ENCOUNTER — Encounter: Payer: Self-pay | Admitting: Emergency Medicine

## 2020-11-10 ENCOUNTER — Other Ambulatory Visit: Payer: Self-pay

## 2020-11-10 ENCOUNTER — Emergency Department: Admit: 2020-11-10 | Payer: Self-pay

## 2020-11-10 ENCOUNTER — Emergency Department
Admission: EM | Admit: 2020-11-10 | Discharge: 2020-11-10 | Disposition: A | Discharge status: Home / Self Care | Payer: Federal, State, Local not specified - PPO | Source: Home / Self Care | Attending: Family Medicine | Admitting: Family Medicine

## 2020-11-10 DIAGNOSIS — J069 Acute upper respiratory infection, unspecified: Secondary | ICD-10-CM

## 2020-11-10 DIAGNOSIS — B9689 Other specified bacterial agents as the cause of diseases classified elsewhere: Secondary | ICD-10-CM

## 2020-11-10 DIAGNOSIS — J019 Acute sinusitis, unspecified: Secondary | ICD-10-CM | POA: Diagnosis not present

## 2020-11-10 LAB — POCT RAPID STREP A (OFFICE): Rapid Strep A Screen: NEGATIVE

## 2020-11-10 LAB — POC SARS CORONAVIRUS 2 AG -  ED: SARS Coronavirus 2 Ag: NEGATIVE

## 2020-11-10 MED ORDER — PREDNISONE 20 MG PO TABS: 20.0000 mg | ORAL_TABLET | Freq: Two times a day (BID) | ORAL | 0 refills | Status: DC

## 2020-11-10 MED ORDER — AMOXICILLIN 875 MG PO TABS: 875.0000 mg | ORAL_TABLET | Freq: Two times a day (BID) | ORAL | 0 refills | Status: DC

## 2020-11-10 NOTE — ED Triage Notes (Signed)
Sinus congestion, sore throat & body aches since Wed  Denies fever - has not checked  OTC mucinex  & cold medicine  Strep test (rapid)was negative on Thursday - pt's sister had it this week Loss of smell & test today  Inflamed taste buds per pt -(concerned about thrush) COVID vaccine x 2
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
## 2020-11-10 NOTE — Discharge Instructions (Addendum)
Continue to drink lots of water Take amoxicillin antibiotic 2 times a day Take prednisone 2 times a day with food May continue Mucinex DM to loosen mucus Follow-up with your primary care doctor

## 2020-11-20 DIAGNOSIS — F438 Other reactions to severe stress: Secondary | ICD-10-CM | POA: Diagnosis not present

## 2020-11-20 DIAGNOSIS — F419 Anxiety disorder, unspecified: Secondary | ICD-10-CM | POA: Diagnosis not present
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
## 2020-11-26 ENCOUNTER — Telehealth (INDEPENDENT_AMBULATORY_CARE_PROVIDER_SITE_OTHER): Payer: Federal, State, Local not specified - PPO | Admitting: Psychiatry

## 2020-11-26 ENCOUNTER — Encounter: Payer: Self-pay | Admitting: Psychiatry

## 2020-11-26 ENCOUNTER — Other Ambulatory Visit: Payer: Self-pay

## 2020-11-26 DIAGNOSIS — G4700 Insomnia, unspecified: Secondary | ICD-10-CM

## 2020-11-26 DIAGNOSIS — F515 Nightmare disorder: Secondary | ICD-10-CM

## 2020-11-26 DIAGNOSIS — F331 Major depressive disorder, recurrent, moderate: Secondary | ICD-10-CM | POA: Diagnosis not present

## 2020-11-26 DIAGNOSIS — F900 Attention-deficit hyperactivity disorder, predominantly inattentive type: Secondary | ICD-10-CM | POA: Diagnosis not present

## 2020-11-26 MED ORDER — AMPHETAMINE-DEXTROAMPHETAMINE 7.5 MG PO TABS: 7.5000 mg | ORAL_TABLET | Freq: Two times a day (BID) | ORAL | 0 refills | Status: DC

## 2020-11-26 NOTE — Patient Instructions (Signed)
Decrease Adderall 7.5 mg twice a day Referral for ADHD evaluation Next appointment- 10/12 at [facility]

## 2020-11-28 DIAGNOSIS — F419 Anxiety disorder, unspecified: Secondary | ICD-10-CM | POA: Diagnosis not present

## 2020-11-28 DIAGNOSIS — F438 Other reactions to severe stress: Secondary | ICD-10-CM | POA: Diagnosis not present

## 2020-12-12 DIAGNOSIS — F438 Other reactions to severe stress: Secondary | ICD-10-CM | POA: Diagnosis not present

## 2020-12-12 DIAGNOSIS — F419 Anxiety disorder, unspecified: Secondary | ICD-10-CM | POA: Diagnosis not present

## 2020-12-19 DIAGNOSIS — F419 Anxiety disorder, unspecified: Secondary | ICD-10-CM | POA: Diagnosis not present

## 2020-12-19 DIAGNOSIS — F438 Other reactions to severe stress: Secondary | ICD-10-CM | POA: Diagnosis not present

## 2020-12-25 DIAGNOSIS — F419 Anxiety disorder, unspecified: Secondary | ICD-10-CM | POA: Diagnosis not present

## 2020-12-25 DIAGNOSIS — F4389 Other reactions to severe stress: Secondary | ICD-10-CM | POA: Diagnosis not present
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
## 2021-01-01 ENCOUNTER — Telehealth (INDEPENDENT_AMBULATORY_CARE_PROVIDER_SITE_OTHER): Payer: Federal, State, Local not specified - PPO | Admitting: Psychiatry

## 2021-01-01 ENCOUNTER — Encounter: Payer: Self-pay | Admitting: Psychiatry

## 2021-01-01 ENCOUNTER — Telehealth: Payer: Self-pay

## 2021-01-01 ENCOUNTER — Other Ambulatory Visit: Payer: Self-pay

## 2021-01-01 DIAGNOSIS — F900 Attention-deficit hyperactivity disorder, predominantly inattentive type: Secondary | ICD-10-CM

## 2021-01-01 DIAGNOSIS — F331 Major depressive disorder, recurrent, moderate: Secondary | ICD-10-CM

## 2021-01-01 MED ORDER — METHYLPHENIDATE HCL 5 MG PO TABS: 5.0000 mg | ORAL_TABLET | Freq: Two times a day (BID) | ORAL | 0 refills | Status: DC

## 2021-01-01 NOTE — Telephone Encounter (Signed)
went online to [email or web address] and a prior auth needed to be done for the methylphenidate .  - pa was submitted and approved from 12-02-20 to  01-01-22

## 2021-01-01 NOTE — Patient Instructions (Addendum)
Discontinue Adderall Start Ritalin 5 mg twice a day  She was advised to contact neurology clinic for sleep study Next appointment- 11/3 at [facility]  Please call the following numbers if needed To schedule/cancel an appointment: [phone] To speak with nurse (about medication, forms or other concerns): [phone]

## 2021-01-02 DIAGNOSIS — F419 Anxiety disorder, unspecified: Secondary | ICD-10-CM | POA: Diagnosis not present

## 2021-01-02 DIAGNOSIS — F4389 Other reactions to severe stress: Secondary | ICD-10-CM | POA: Diagnosis not present

## 2021-01-08 ENCOUNTER — Other Ambulatory Visit: Payer: Self-pay | Admitting: Psychiatry

## 2021-01-08 ENCOUNTER — Telehealth: Payer: Self-pay

## 2021-01-08 NOTE — Telephone Encounter (Signed)
Could you ask if she wants to be back on Adderall 7.5 mg twice a day, or 10 mg in AM, 7.5 mg around lunch time if she is interested.

## 2021-01-08 NOTE — Telephone Encounter (Signed)
pt called states she can not take the ritalin .  she states that she feel depressed, anger, fatigue, and sometimes violent.

## 2021-01-15 DIAGNOSIS — F419 Anxiety disorder, unspecified: Secondary | ICD-10-CM | POA: Diagnosis not present

## 2021-01-15 DIAGNOSIS — F4389 Other reactions to severe stress: Secondary | ICD-10-CM | POA: Diagnosis not present

## 2021-01-21 ENCOUNTER — Telehealth: Payer: Self-pay | Admitting: Psychiatry

## 2021-01-21 NOTE — Telephone Encounter (Signed)
Discussed with the patient. She is interested in trying 10 mg in AM, 7.5 mg around lunch time due to adverse reaction from Ritalin including fatigue. She has enough meds to last until the next visit.

## 2021-01-22 ENCOUNTER — Telehealth: Payer: Self-pay

## 2021-01-22 DIAGNOSIS — F419 Anxiety disorder, unspecified: Secondary | ICD-10-CM | POA: Diagnosis not present

## 2021-01-22 DIAGNOSIS — F4389 Other reactions to severe stress: Secondary | ICD-10-CM | POA: Diagnosis not present

## 2021-01-22 NOTE — Addendum Note (Signed)
Addended by: Huston Foley on: 01/22/2021 05:05 PM   Modules accepted: Orders

## 2021-01-22 NOTE — Telephone Encounter (Signed)
Sleep study ordered as addendum to the office note. Not sure what happened at the time.

## 2021-01-22 NOTE — Telephone Encounter (Signed)
Patient called and left a voicemail about scheduling a sleep study. She was seen back in June of this year and the office visit note states a sleep study was discussed. Currently there are no orders for a sleep study...would you still like to have patient complete a sleep study?
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
## 2021-01-23 ENCOUNTER — Encounter: Payer: Self-pay | Admitting: Psychiatry

## 2021-01-23 ENCOUNTER — Other Ambulatory Visit: Payer: Self-pay

## 2021-01-23 ENCOUNTER — Telehealth (INDEPENDENT_AMBULATORY_CARE_PROVIDER_SITE_OTHER): Payer: Federal, State, Local not specified - PPO | Admitting: Psychiatry

## 2021-01-23 DIAGNOSIS — F515 Nightmare disorder: Secondary | ICD-10-CM | POA: Diagnosis not present

## 2021-01-23 DIAGNOSIS — F331 Major depressive disorder, recurrent, moderate: Secondary | ICD-10-CM

## 2021-01-23 DIAGNOSIS — F900 Attention-deficit hyperactivity disorder, predominantly inattentive type: Secondary | ICD-10-CM | POA: Diagnosis not present

## 2021-01-23 DIAGNOSIS — G4700 Insomnia, unspecified: Secondary | ICD-10-CM

## 2021-01-23 MED ORDER — AMPHETAMINE-DEXTROAMPHETAMINE 10 MG PO TABS: 10.0000 mg | ORAL_TABLET | Freq: Every day | ORAL | 0 refills | Status: DC

## 2021-01-23 MED ORDER — AMPHETAMINE-DEXTROAMPHETAMINE 7.5 MG PO TABS
7.5000 mg | ORAL_TABLET | Freq: Every evening | ORAL | 0 refills | Status: DC
Start: 2021-02-06 — End: 2021-07-01

## 2021-01-23 NOTE — Patient Instructions (Signed)
Discontinue Ritalin Stat Adderall 10 mg in AM, 7.5 mg in PM Next appointment- 12/5 at 11:30

## 2021-02-06 ENCOUNTER — Ambulatory Visit (INDEPENDENT_AMBULATORY_CARE_PROVIDER_SITE_OTHER): Payer: Federal, State, Local not specified - PPO | Admitting: Psychology

## 2021-02-06 DIAGNOSIS — F89 Unspecified disorder of psychological development: Secondary | ICD-10-CM

## 2021-02-10 ENCOUNTER — Telehealth: Payer: Self-pay | Admitting: Neurology

## 2021-02-10 DIAGNOSIS — F4389 Other reactions to severe stress: Secondary | ICD-10-CM | POA: Diagnosis not present

## 2021-02-10 DIAGNOSIS — F419 Anxiety disorder, unspecified: Secondary | ICD-10-CM | POA: Diagnosis not present

## 2021-02-10 NOTE — Telephone Encounter (Signed)
LVM for pt to call me back to schedule sleep study  

## 2021-02-12 DIAGNOSIS — N926 Irregular menstruation, unspecified: Secondary | ICD-10-CM | POA: Diagnosis not present

## 2021-02-12 DIAGNOSIS — N920 Excessive and frequent menstruation with regular cycle: Secondary | ICD-10-CM | POA: Diagnosis not present

## 2021-02-12 DIAGNOSIS — D649 Anemia, unspecified: Secondary | ICD-10-CM | POA: Diagnosis not present

## 2021-02-17 ENCOUNTER — Telehealth: Payer: Self-pay | Admitting: Neurology

## 2021-02-17 NOTE — Telephone Encounter (Signed)
LVM for pt to call me back to schedule sleep study  

## 2021-02-19 ENCOUNTER — Ambulatory Visit: Payer: Federal, State, Local not specified - PPO | Admitting: Psychology
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
## 2021-02-24 ENCOUNTER — Ambulatory Visit (INDEPENDENT_AMBULATORY_CARE_PROVIDER_SITE_OTHER): Payer: Federal, State, Local not specified - PPO | Admitting: Neurology

## 2021-02-24 ENCOUNTER — Encounter: Payer: Self-pay | Admitting: Psychiatry

## 2021-02-24 ENCOUNTER — Telehealth (INDEPENDENT_AMBULATORY_CARE_PROVIDER_SITE_OTHER): Payer: Federal, State, Local not specified - PPO | Admitting: Psychiatry

## 2021-02-24 DIAGNOSIS — G4700 Insomnia, unspecified: Secondary | ICD-10-CM | POA: Diagnosis not present

## 2021-02-24 DIAGNOSIS — F909 Attention-deficit hyperactivity disorder, unspecified type: Secondary | ICD-10-CM | POA: Diagnosis not present

## 2021-02-24 DIAGNOSIS — F39 Unspecified mood [affective] disorder: Secondary | ICD-10-CM

## 2021-02-24 DIAGNOSIS — F331 Major depressive disorder, recurrent, moderate: Secondary | ICD-10-CM | POA: Diagnosis not present

## 2021-02-24 DIAGNOSIS — G4710 Hypersomnia, unspecified: Secondary | ICD-10-CM

## 2021-02-24 DIAGNOSIS — F515 Nightmare disorder: Secondary | ICD-10-CM | POA: Diagnosis not present

## 2021-02-24 DIAGNOSIS — R0683 Snoring: Secondary | ICD-10-CM

## 2021-02-24 MED ORDER — AMPHETAMINE-DEXTROAMPHETAMINE 7.5 MG PO TABS: 7.5000 mg | ORAL_TABLET | Freq: Every evening | ORAL | 0 refills | Status: DC

## 2021-02-24 MED ORDER — AMPHETAMINE-DEXTROAMPHETAMINE 10 MG PO TABS: 10.0000 mg | ORAL_TABLET | Freq: Every day | ORAL | 0 refills | Status: DC

## 2021-02-24 NOTE — Patient Instructions (Addendum)
Continue Adderall 10 mg in AM, 7.5 mg in PM Next appointment- 2/23 at 8 AM, video

## 2021-02-27 DIAGNOSIS — N938 Other specified abnormal uterine and vaginal bleeding: Secondary | ICD-10-CM | POA: Diagnosis not present

## 2021-02-27 DIAGNOSIS — Z01419 Encounter for gynecological examination (general) (routine) without abnormal findings: Secondary | ICD-10-CM | POA: Diagnosis not present

## 2021-02-27 DIAGNOSIS — N925 Other specified irregular menstruation: Secondary | ICD-10-CM | POA: Diagnosis not present

## 2021-02-27 DIAGNOSIS — R634 Abnormal weight loss: Secondary | ICD-10-CM | POA: Diagnosis not present

## 2021-02-27 DIAGNOSIS — Z1151 Encounter for screening for human papillomavirus (HPV): Secondary | ICD-10-CM | POA: Diagnosis not present

## 2021-02-27 DIAGNOSIS — F419 Anxiety disorder, unspecified: Secondary | ICD-10-CM | POA: Diagnosis not present

## 2021-02-27 DIAGNOSIS — Z124 Encounter for screening for malignant neoplasm of cervix: Secondary | ICD-10-CM | POA: Diagnosis not present

## 2021-02-27 DIAGNOSIS — Q5128 Other doubling of uterus, other specified: Secondary | ICD-10-CM | POA: Diagnosis not present

## 2021-02-27 DIAGNOSIS — F4389 Other reactions to severe stress: Secondary | ICD-10-CM | POA: Diagnosis not present

## 2021-03-04 ENCOUNTER — Telehealth: Payer: Federal, State, Local not specified - PPO | Admitting: Psychiatry

## 2021-03-04 DIAGNOSIS — L700 Acne vulgaris: Secondary | ICD-10-CM | POA: Diagnosis not present

## 2021-03-04 DIAGNOSIS — L400 Psoriasis vulgaris: Secondary | ICD-10-CM | POA: Diagnosis not present

## 2021-03-04 DIAGNOSIS — L219 Seborrheic dermatitis, unspecified: Secondary | ICD-10-CM | POA: Diagnosis not present

## 2021-03-11 DIAGNOSIS — F4389 Other reactions to severe stress: Secondary | ICD-10-CM | POA: Diagnosis not present

## 2021-03-11 DIAGNOSIS — F419 Anxiety disorder, unspecified: Secondary | ICD-10-CM | POA: Diagnosis not present

## 2021-03-12 ENCOUNTER — Ambulatory Visit (INDEPENDENT_AMBULATORY_CARE_PROVIDER_SITE_OTHER): Payer: Federal, State, Local not specified - PPO | Admitting: Psychology

## 2021-03-12 DIAGNOSIS — F331 Major depressive disorder, recurrent, moderate: Secondary | ICD-10-CM

## 2021-03-12 DIAGNOSIS — F411 Generalized anxiety disorder: Secondary | ICD-10-CM

## 2021-03-12 DIAGNOSIS — F902 Attention-deficit hyperactivity disorder, combined type: Secondary | ICD-10-CM | POA: Diagnosis not present

## 2021-03-12 NOTE — Progress Notes (Signed)
Testing and Report Writing Information: The following measures  were administered, scored, and interpreted by this provider:  Generalized Anxiety Disorder-7 (GAD-7; 5 minutes), Patient Health Questionnaire-9 (PHQ-9; 5 minutes), Wechsler Adult Intelligence Scale-Fourth Edition (WAIS-IV; 60 minutes), CNS Vital Signs (45 minutes), Adult Attention Deficit/Hyperactivity Disorder Self-Report Scale Checklist (ASRSv1.1; 15 minutes), Behavior Rating Inventory for Executive Function - A - Self Report (Brief- A; 10 minutes), Personality Assessment Inventory (PAI; 50 minutes), and Adult OCD Inventory (OCD-A) SF-20 (15 minutes). A total of 205 minutes was spent on the administration of the scoring of the aforementioned measures and codes 96136 and [phone] (5 units - this evaluator purposely charged less billing codes than the time required) were billed.  Please see the assessment for additional details. This provider completed the written report which includes integration of patient data, interpretation of standardized test results, interpretation of clinical data, review of information provided by [facility] and any collateral information/documentation, and clinical decision making (185 minutes in total).  Feedback Appointment: Date: 03/12/2021 Appointment Start Time: 4pm Duration: 50 minutes Provider: Clarice Pole, PsyD Type of Session: Feedback Appointment for Evaluation  Location of Patient: Home Location of Provider: Provider's Home (private office) Type of Contact: Webex video visit with audio  Session Content: Today's appointment was a telepsychological visit due to COVID-19. Malina is aware it is her responsibility to secure confidentiality on her end of the session. She provided verbal consent to proceed with today's appointment. Prior to proceeding with today's appointment, Karrigan's physical location at the time of this appointment was obtained as well a phone number she could be reached at in the event of  technical difficulties. Arihana denied anyone else being present in the room or on the virtual appointment.  This provider and Andrea Baker completed the interactive feedback session which includes reviewing the following measures: Generalized Anxiety Disorder-7 (GAD-7), Patient Health Questionnaire-9 (PHQ-9), Wechsler Adult Intelligence Scale-Fourth Edition (WAIS-IV), CNS Vital Signs, Adult Attention Deficit/Hyperactivity Disorder Self-Report Scale Checklist (ASRSv1.1), Behavior Rating Inventory for Executive Function - A - Self Report (Brief- A), Personality Assessment Inventory (PAI). During this interactive feedback session, results of the aforementioned measures, treatment recommendations, and diagnostic conclusions were discussed.   The interactive feedback session was completed today and a total of 50 minutes was spent on feedback. Code 69629 was billed for feedback session.   Mental Status Examination:  Appearance:  neat Behavior: appropriate to circumstances Mood: neutral Affect: mood congruent Speech: WNL Eye Contact: appropriate Psychomotor Activity: WNL Thought Process: linear, logical, and goal directed and denies suicidal, homicidal, and self-harm ideation, plan and intent Content/Perceptual Disturbances: none Orientation: AAOx4 Cognition/Sensorium: intact Insight: good Judgment: good  DSM-5 Diagnosis(es):  F90.2 Attention-Deficit/Hyperactivity Disorder, Combined Presentation, Severe F33.1 Major Depressive Disorder, Recurrent, Moderate F41.1 Generalized Anxiety Disorder  Time Requirements: Assessment scoring and interpreting: 205 minutes (billing code [phone] and [phone] [5 units - this evaluator purposely charged less billing codes than the time required warrants]) Feedback: 50 minutes (billing code [phone]) Report writing:  185 total minutes. 01/24/2021: 3:35-3:45pm. 02/07/2021: 4:30-4:50pm and 5-5:40pm. 02/19/2021: 6:10-6:35pm and 8:10-8:15pm. 02/26/2021: 10:50-11am. 03/03/2021:  6:35-7:50pm. (billing code [phone] [3 units])  Plan: Andrea Baker provided verbal consent for her evaluation to be sent via e-mail. No further follow-up planned by this provider.      CONFIDENTIAL PSYCHOLOGICAL EVALUATION ______________________________________________________________________________  Name: Andrea Baker   Date of Birth: 06-02-1990    Age: 29 Dates of Evaluation: 02/06/2021, 02/19/2021, and 02/26/2021  SOURCE AND REASON FOR REFERRAL: Andrea Baker was referred by PA Iran Planas for an  evaluation to ascertain if she meets criteria for Attention Deficit/Hyperactivity Disorder (ADHD).   EVALUATIVE PROCEDURES: Clinical Interview with Andrea Baker (02/06/2021) Wechsler Adult Intelligence Scale-Fourth Edition (WAIS-IV; 02/19/2021) CNS Vital Signs (02/19/2021) Adult Attention Deficit/Hyperactivity Disorder Self-Report Scale Checklist (02/19/2021) Behavior Rating Inventory for Executive Function - A - Self Report (BRIEF-A; 02/26/2021) Personality Assessment Inventory (PAI; 02/26/2021) Patient Health Questionnaire-9 (PHQ-9) Generalized Anxiety Disorder-7 (GAD-7) Adult OCD Inventory (OCD-A) SF-20 (02/19/2021)   BACKGROUND INFORMATION AND PRESENTING PROBLEM: Andrea Baker is a 30 year old female who resides in [place], [place]  Ms. Linthicum stated she has had ADHD her whole life and her psychiatrist recently asked about it, which led to her seeking an ADHD evaluation. She described her ADHD-related concerns as including frequent forgetfulness (e.g., food she is cooking, closing and locking the front door, where she is driving and what color the traffic light is, how to complete various tasks, and misplacing items); her mind going blank; being easily distracted by thoughts and her fidgeting (e.g., twirling her hair, chewing on a pen, and picking at her thumb); requiring significantly longer times to complete tasks, noting what takes others eight hours takes  [her] 100 hours; a desire for her work to be perfect; often procrastinating tasks until last minute or when conditions are perfect; sleep maintenance impairment and fatigue; regularly being late; disorganization; hyperfixating on tasks; occasional interrupting of others; difficulty waiting her turn; frequently being irritable but catching it quickly; and problems stopping tasks when required. Additionally, she endorsed a history of nightmares that can be any number of topics and are four levels deep, sharing she continues to wake up but be in another nightmare. She also shared a history of depressive episodes and infrequent panic attacks.  She expressed a belief her cognitive concerns have become exacerbated since 2019. She attributed the exacerbation to which sleep impairment caused by her frequent nightmares, although noted after a difficult relationship ended and she utilized mental health services her nightmares have reduced. She stated a belief her cognitive concerns are independent of mood. She noted she currently utilizes Adderall, which she helps a lot with her endorsed concerns and reducing the time required to complete tasks. She described her coping strategies as including utilization of air tags and sticky notes, having a friend provide reminders, use of sleep-based meditations, writing down information, using color schemes for organization, and wearing ear plugs to reduce auditory distractions.   Ms. Ragan denied experiencing developmental delays, noting she was walking at 7 months. She described herself as shy as a child. She discussed she primarily received As throughout schooling and graduated early from college despite occasionally forgetting to turn in assignments, adding her self-worth was tied up in [her] performance on tasks. She reported she currently gets into trouble at her employment because she does not finish documentation on time, which she attributed to her  requiring an extended time to complete tasks.   Ms. Chalmers described her medical history as significant for GI issues and headaches. She reported she is currently utilizing using mental health services and EMDR for her nightmares. She also reported past use of mental health services for depression and relationship issues. She denied ever experiencing seizures of head injury; derealization or depersonalization; eating disorder; psychosis; suicidal or homicidal ideation, plan, or intent; or legal involvement. She shared use of two standard size drinks of alcohol up to once a week as well as daily use of 12oz of coffee. She denied use of any other recreational or illicit substances.  She reported a familial mental health history that is significant for her mother taking Effexor and her father stating suicidal ideation and plan to them as children.   BEHAVIORAL OBSERVATIONS: Ms. Joaquin presented on time for the evaluation. She was well-groomed. She was oriented to time, place, person, and purpose of the appointment. She reported she did not take her prescription of Adderall during the day of completing the CNS and WAIS-IV. During the interview she was often tangential. Ms. Maresh verbalized being annoyed by the testing and working memory difficulties (e.g., I cannot [do the arithmetic tasks] without writing the information down). Throughout the course of the evaluation, she maintained appropriate eye contact. Her thought processes and content were logical, coherent, and goal directed. There were no overt signs of a thought disorder or perceptual disturbances, nor did she report such symptomatology. There was no evidence of paraphasias (i.e., errors in speech, gross mispronunciations, and word substitutions), repetition deficits, or disturbances in volume or prosody (i.e., rhythm and intonation). Overall, based on Ms. Aument's approach to testing, the current results are believed to be a good estimate of her  abilities.  PROCEDURAL CONSIDERATIONS:  Psychological testing measures were conducted through a virtual visit with video and audio capabilities, but otherwise in a standard manner.   The Wechsler Adult Intelligence Scale, Fourth Edition (WAIS-IV) was administered via remote telepractice using digital stimulus materials on [employer]'s Q-global system. The remote testing environment appeared free of distractions, adequate rapport was established with the examinee via video/audio capabilities, and Ms. Grassia appeared appropriately engaged in the task throughout the session. No significant technological problems or distractions were noted during administration. Modifications to the standardization procedure included: none. The WAIS-IV subtests, or similar tasks, have received initial validation in several samples for remote telepractice and digital format administration, and the results are considered a valid description of Ms. Cooley's skills and abilities.  CLINICAL FINDINGS:  COGNITIVE FUNCTIONING  Wechsler Adult Intelligence Scale, Fourth Edition (WAIS-IV):  Ms. Sprowl completed subtests of the WAIS-IV, a full-scale measure of cognitive ability. She completed subtests of the WAIS-IV, a full-scale measure of cognitive ability. The WAIS-IV is comprised of four indices that measures cognitive processes that are components of intellectual ability; however, only subtests from the Verbal Comprehension and Working Memory indices were administered. As a result, Full-Scale-IQ (FSIQ) and General Ability Index (GAI) were unable to be determined. She presents with similar abilities.  WAIS-IV Scale/Subtest IQ/Scaled Score 95% Confidence Interval Percentile Rank Qualitative Description  Verbal Comprehension (VCI) 102 96-108 55 Average  Similarities 9     Vocabulary 10     Information 12     Working Memory (WMI) 102 95-109 55 Average  Digit Span 11     Arithmetic 10       The Verbal Comprehension Index (VCI) provides  a measure of one's ability to receive, comprehend, and express language. It also measures the ability to retrieve previously learned information and to understand relationships between words and concepts presented orally.  Ms. Mehta obtained a VCI scaled score of 102 (55th percentile) placing her in the Average range compared to same-aged peers. Her performance on the subtests comprising this index was diverse. Out of the three subtests, Ms. Lahey demonstrated the strongest performance on the Information subtest, which is primarily a measure of general knowledge. Performance on this subtest may also be influenced by cultural experiences and quality of education, as well as ability to retrieve information from long-term memory.  The Working Memory Index ([place]) provides a measure  of one's ability to sustain attention, concentrate, and exert mental control.  Ms. Reber obtained a WMI scaled score of 102 (55th percentile), placing her in the Average range compared to same-aged peers.  Ms. Godden demonstrated similar performance on the subtests comprising this index.  ATTENTION AND PROCESSING  CNS Vital Signs: The CNS Vital Signs assessment evaluates the neurocognitive status of an individual and covers a range of mental processes. The results of the CNS Vital Signs testing indicated very low neurocognitive processing ability. Regarding areas related to attention problems, only sustained attention was in the average range with complex attention in the low range and simple attention in the very low range. Executive functioning and cognitive flexibility were in the very low range. Working memory was average. Psychomotor speed, motor speed, and processing speed were low average-average. Reaction time was very low. Visual (images) and verbal (words) memory were average. The results suggest Ms. Shannahan experiences reaction time, cognitive flexibility, executive function, and simple attention impairment. She demonstrates relative  strengths in memory, processing speed, working memory, and sustained attention.   Domain  Standard Score Percentile Validity Indicator Guideline  Neurocognitive Index 66 1 Yes Very Low  Composite Memory 93 32 Yes Average  Verbal Memory 90 25 Yes Average  Visual Memory 99 47 Yes Average  Psychomotor Speed 83 13 Yes Low Average  Reaction Time 16 1 Yes Very Low  Complex Attention 72 3 Yes Low  Cognitive Flexibility 67 1 Yes Very Low  Processing Speed  90 25 Yes Average  Executive Function 66 1 Yes Very Low  Working Memory 99 47 Yes Average  Sustained Attention 97 42 Yes Average  Simple Attention 28 1 Yes Very Low  Motor Speed 86 18 Yes Low Average   EXECUTIVE FUNCTION  Behavior Rating Inventory of Executive Function, Second Edition (BRIEF-A):  Ms. Marshman completed the Self-Report Form of the Behavior Rating Inventory of Executive Function-Adult Version (BRIEF-A), which has three domains that evaluate cognitive, behavioral, and emotional regulation, and a Global Executive Composite score provides an overall snapshot of executive functioning. There are no missing item responses in the protocol.  The Negativity, Infrequency, and Inconsistency scales are not elevated, suggesting she did not respond to the protocol in an overly negative, haphazard, extreme, or inconsistent manner. In the context of these validity considerations, ratings of Ms. Patrone's everyday executive function suggest some areas of concern. The overall index, the Global Executive Composite (GEC), was elevated (GEC T = 81, %ile = 99). Both the Behavioral Regulation (BRI) and the Metacognition (MI) Indexes were elevated (BRI T = 71, %ile = 96 and MI T = 85, %ile = >99). Ms. Tatum indicated difficultly with her ability to adjust to changes in routine or task demands, modulate emotions, initiate problem solving or activity, sustain working memory, plan and organize problem-solving approaches, attend to task-oriented output, and organize  environment and materials. She did not describe her ability inhibit impulsive responses and monitor social behavior is as problematic. Her profile suggests significant problem-solving rigidity combined with emotional dysregulation. Individuals with this profile tend to lose emotional control when their routines or perspectives are challenged and/or flexibility is required.  Scale/Index  Raw Score T Score Percentile  Inhibit 13 54 79  Shift 17 87 >99  Emotional Control 24 71 96  Self-Monitor 10 55 77  Behavioral Regulation Index (BRI) 64 71 96  Initiate 19 74 98  Working Memory 24 95 >99  Plan/Organize 23 75 99  Task Monitor 17 88 >99  Organization of Materials 21 74 99  Metacognition Index (MI) 104 83 >99  Global Executive Composite (GEC) 168 81 99   Validity Scale Raw Score Cumulative Percentile Protocol Classification  Negativity 3 0 - 98.3 Acceptable  Infrequency 0 0 - 97.3 Acceptable  Inconsistency 2 0 - 99.2 Acceptable   BEHAVIORAL FUNCTIONING   Patient Health Questionnaire-9 (PHQ-9): Ms. Sonier completed the PHQ-9, a self-report measure that assesses symptoms of depression. She scored 16/27, which indicates moderately severe depression.   Generalized Anxiety Disorder- 7 (GAD-7): Ms. Mountjoy completed the GAD-7, a self-report measure that assesses symptoms of anxiety. She scored an 11/21, which indicates moderate anxiety.   Adult ADHD Self-Report Scale Symptom Checklist (ASRS): Ms. Marshman reported the following symptoms as sometimes: feeling overly active and compelled to do things, talking too much in social situations, and difficulty waiting for turn in turn taking situations. She endorsed the following symptoms as occurring often: problems remembering appointments or obligations, difficulty relaxing, and interrupting others when they are busy. She endorsed the following symptoms as very often: difficulty wrapping up final details of a project following the completion of challenging  aspects, difficulty getting things in order when a task requires organization, avoiding or delaying getting started on tasks requiring a lot of thought, fidgeting or squirming, struggling to sustain attention when doing boring or repetitive work, struggling to concentrate on what people say to them even when they are speaking directly to them,  misplacing or has difficulty finding things, being distracted by noise around her, and feeling restless or fidgety. Endorsement of at least four items in Part A is highly consistent with ADHD in adults. The frequency scores of Part B provides additional clues. Ms. Rasnic scored a 5/6 on Part A and 7/12 on Part B, which is considered a positive screening for ADHD.   Adult OCD Inventory (OCD-A) SF-20: The OCD-A SF-20 was administered. Ms. Kohut scored a 125/300, which is indicative of mild problems with OCD-related concerns. She endorsed the following as Yes, this stops me a little or wastes a little of my time: arranging things in certain ways and getting angry if others mess up her desk or work area. She endorsed the following as Yes, this stops me from doing other things or wastes some of my time: thoughts or words that repeat themselves over and over in her mind, hating dirt or dirty things, feeling guilty over minor infractions, and worrying about the germs that are on things. She endorsed the following as Yes, this stops me from doing a lot of things and wastes a lot of my time: spending more time than needed to check her work, worrying about being clean, trouble making up her mind, liking to eat the same foods, and having to check things several times.  Personality Assessment Inventory (PAI): The PAI is an objective inventory of adult personality. The validity indicators suggested Ms. Hammes's profile is interpretable (ICN T = 55, INF T = 51, NIM T = 62, and PIM T = 25). She is endorsing significant concerns about her somatic functioning and likely experiences  impairment (SOM T = 83, SOM-S T = 78, and SOM-H T = 92); significant anxiety and tension (ANX T = 80) that may at partially be explained by a past disturbing event(s) (ARD-T T = 72) and involves ruminative worry which contributes to  concentration impairment (ANX-C T = 85, ARD-O T = 81, and DEP-C T = 75), difficulty relaxing and often feeling tense (ANX-A T = 81), marked  rigidity (ARD-O T = 81), and phobic fears (ARD-P T  = 73); prominent unhappiness and dysphoria (DEP T = 76) that involves thoughts of worthlessness and hopelessness (DEP-C T = 75), anhedonia (DEP-A T = 81), feelings of emptiness (BOR-I T = 80), and dwelling on past slights (PAR-R T = 83); a loosening of associates and significant difficulties with self-expression (SCZ-T T = 96); and numerous problems in past attachment relationships (BOR-N T = 72). She acknowledges significant difficulties in functioning and holds a perception help is needed in dealing with these problems (RXR T = 35).     SUMMARY AND CLINICAL IMPRESSIONS: Ms. Mckinzey Entwistle is a 30 year old female who was referred by PA Iran Planas for an evaluation to determine if she currently meets criteria for a diagnosis of Attention-Deficit/Hyperactivity Disorder (ADHD).   Ms. Plack expressed a belief she has had ADHD her whole life and her psychiatrist asked about it, which led to her seeking an ADHD evaluation. She also endorsed a history of nightmares in which she continues to wake up but be in another nightmare, depressive episodes, anxiety, and infrequent panic attacks. She noted her cognitive concerns have become exacerbated since 2019, which she attributed to sleep impairment caused by her nightmares. She further noted mental health services and use of Adderall have improved her endorsed concerns.   During the evaluation, Ms. Everson was administered assessments to measure her current cognitive abilities. Her verbal comprehension abilities were in the average range. She  demonstrated the strongest performance on the Information subtest, which is primarily a measure of general knowledge. Performance on this subtest may also be influenced by cultural experiences and quality of education, as well as ability to retrieve information from long-term memory. Her ability to sustain attention, concentrate, and exert mental control was also in the average range and she demonstrated similar performance on the subtests. Results of the CNS Vital Signs indicated a very low neurocognitive processing ability with her results suggesting she experiences reaction time, cognitive flexibility, executive functioning, and simple attention impairment.   During the clinical interview and on self-report measures, Ms. Yung endorsed executive functioning and concentration impairment, hyperactivity-related symptoms, and meeting full criteria for ADHD. More specifically, she endorsed frequent forgetfulness (e.g., food she is cooking, closing and locking the front door, where she is driving and what color the traffic light is, how to complete various tasks, and misplacing items); her mind going blank; being easily distracted by thoughts and her fidgeting (e.g., twirling her hair, chewing on a pen, and picking at her thumb); requiring significantly longer times to complete tasks, noting what takes others eight hours takes [her] 100 hours; a desire for her work to be perfect; often procrastinating tasks until last minute or when conditions are perfect; sleep maintenance impairment and fatigue; regularly being late; disorganization; hyperfixating on tasks; occasional interrupting of others; difficulty waiting her turn; frequently being irritable but catching it quickly; and problems stopping tasks when required. When considering self-reported symptoms; endorsed and/or demonstrated impairment on measures of attention, executive functioning, and cognitive flexibility; and her psychiatrist reportedly  expressing concerns she has a diagnosis of ADHD, a diagnosis of F90.2 Attention-Deficit/Hyperactivity Disorder, Combined Presentation, Severe appears warranted. The specifier of severe was assigned as she endorsed symptoms in excess of what is required to make the diagnosis and indicated experiencing significant impairment in daily and occupational functioning. The following diagnoses were also warranted based on information provided by Ms. Moree during the clinical interview and symptomatology endorsed on measures (PHQ-9, GAD-7, and  PAI): F33.1 Major Depressive Disorder, Recurrent, Moderate and F41.1 Generalized Anxiety Disorder. The specifier of Moderate was assigned based on her endorsement of symptomatology on the PHQ-9 being in the moderately severe range.  Additionally, Ms. 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 She also endorsed a history of repeated and extended nightmares that cause distress and sleep impairment; however, given the limited scope of the evaluation, it was unable to be determined if full criteria for Nightmare Disorder is met.   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 Terrones would likely benefit from making use of strategies for ADHD symptoms:  Setting a timer to complete tasks. Breaking tasks into manageable chunks and spreading them out over longer periods of time with breaks.  Utilizing lists and day calendars to keep track of tasks.  Answering emails  daily.  Improving listening skills by asking the speaker to give information in smaller chunks and asking for explanation for clarification as needed. Leaving more than the anticipated time to complete tasks. It may help to keep Ms. Graham's tasks brief, well within her attention span, and a mix of both high and low interest tasks. Tasks may be gradually increased in length. Practice proactive planning by setting aside time every evening to plan for the next day (e.g., prepare needed materials or pack the car the night before).  Learn how to make an effective and reasonable to do list of important tasks and priorities and always keep it easily accessible. Make additional copies in case it is lost or misplaced. Utilize visual reminders by posting appointments, to do lists, or schedule in strategic areas at home and at work.  Practice using an appointment book, smart phone or other tech device, or a daily planning calendar, and learn to write down appointments and commitments immediately. Keep notepads or use a portable audio recorder to capture important ideas that would be beneficial to recall later. Learn and practice time management skills. Purchase a programmable alarm watch or set an alarm on smartphone to avoid losing track of time.  Use a color-coded file system, desk and closet organizers, storage boxes, or other organization device to reduce clutter and improve efficiency and structure.  Ms. Gullikson may benefit from neurofeedback and mindfulness training to address her symptoms of inattention.  Ms. Mcneff would likely benefit from a consultation regarding medication for ADHD symptoms.   Individual therapeutic services may assist in improving coping skills for ADHD-, depression-, and anxiety-related symptoms. Mental alertness/energy can be raised by increasing exercise; improving sleep; eating a healthy diet; and managing depression and anxiety. Consult with a physician regarding any changes to  physical regimen is recommended. Failing at Normal: An ADHD Success Story by Mellody Drown is a great overview of ADHD.  Major organizations that are a good source of information on ADHD:  Children and Adults with Attention-Deficit/Hyperactivity Disorder (CHADD): chadd.org  Attention Deficit Disorder Association (ADDA): CondoFactory.com.cy ADD Resources: addresources.org ADD WareHouse: addwarehouse.com World Federation of ADHD: adhd-federation.org ADDConsults: https://www.hines.net/. Ms. Devereux would likely benefit from further evaluation of her OCD-, OCPD-, and Nightmare Disorder-related symptoms to definitively rule in or out the disorders.  Future evaluation  if deemed necessary and/or to determine effectiveness of recommended interventions.

## 2021-03-19 ENCOUNTER — Telehealth: Payer: Self-pay | Admitting: Neurology

## 2021-03-27 DIAGNOSIS — F4389 Other reactions to severe stress: Secondary | ICD-10-CM | POA: Diagnosis not present

## 2021-03-27 DIAGNOSIS — F419 Anxiety disorder, unspecified: Secondary | ICD-10-CM | POA: Diagnosis not present

## 2021-04-02 NOTE — Progress Notes (Signed)
° °  [facility] ° °HOME SLEEP TEST (Watch PAT) REPORT ° °STUDY DATE: 03/31/2021 ° °DOB: 11/13/1990 ° °MRN: [id number] ° °ORDERING CLINICIAN: Janeah Kovacich, MD, PhD °  °REFERRING CLINICIAN: Breeback, Jade L, PA-C  ° °CLINICAL INFORMATION/HISTORY: 30-year-old right-handed woman with an underlying medical history of reflux disease, ADHD, psoriasis, constipation, anxiety, depression, vitamin D deficiency, and weight loss, who reports recurrent bad dreams and severe nightmares since 2019. She has trouble going to sleep and trouble staying asleep, she does not wake up rested.  Her sleep difficulty has been longstanding but significantly worse since 2019.  ° °Epworth sleepiness score: 10/24. ° °BMI: 18.4 kg/m² ° °FINDINGS:  ° °Sleep Summary:  ° °Total Recording Time (hours, min): 7 hours, 14 minutes ° °Total Sleep Time (hours, min):  6 hours, 33 minutes  ° °Percent REM (%):    33.5%  ° °Respiratory Indices:  ° °Calculated pAHI (per hour):  1.6/hour        ° °REM pAHI:    5.4/hour      ° °NREM pAHI: 0.5/hour ° °Oxygen Saturation Statistics:  °  °Oxygen Saturation (%) Mean: 96%  ° °Minimum oxygen saturation (%):                 88%  ° °O2 Saturation Range (%): 77 (?) - 99%   ° °O2 Saturation (minutes) <=88%: 0 min  ° °The computer-generated O2 nadir of 77% appears to be erroneous upon my review. ° ° ° °Pulse Rate Statistics:  ° °Pulse Mean (bpm):    62/min   ° °Pulse Range (44-113/min)  ° °IMPRESSION: Snoring  ° °RECOMMENDATION:  °This home sleep test does not demonstrate any significant obstructive or central sleep disordered breathing.  Minimal intermittent snoring was noted.  The computer-generated O2 nadir of 77% appears to be erroneous upon my review.  If there is concern regarding an underlying lung disease, the patient is advised to seek evaluation through a pulmonologist.  The patient will be encouraged to talk to her primary care provider about a possible referral.   °Other causes of the patient's  symptoms, including circadian rhythm disturbances, an underlying mood disorder, medication effect and/or an underlying medical problem cannot be ruled out based on this test. Clinical correlation is recommended. The patient should be cautioned not to drive, work at heights, or operate dangerous or heavy equipment when tired or sleepy. Review and reiteration of good sleep hygiene measures should be pursued with any patient. °The patient can follow up with her referring provider, who will be notified of the test results. An appointment in sleep clinic can be made as necessary.  ° °I certify that I have reviewed the raw data recording prior to the issuance of this report in accordance with the standards of the American Academy of Sleep Medicine (AASM). ° ° °INTERPRETING PHYSICIAN:  ° °Tajh Livsey, MD, PhD  °Board Certified in Neurology and Sleep Medicine ° °[facility] °[address], [address] °[place], [place] [address] °[phone] ° ° ° ° ° ° ° ° ° ° ° ° ° ° ° ° ° ° °

## 2021-04-03 NOTE — Procedures (Signed)
° °  [facility]  HOME SLEEP TEST (Watch PAT) REPORT  STUDY DATE: 03/31/2021  DOB: 1990-09-11  MRN: [id number]  ORDERING CLINICIAN: Huston Foley, MD, PhD   REFERRING CLINICIAN: Jomarie Longs, PA-C   CLINICAL INFORMATION/HISTORY: 31 year old right-handed woman with an underlying medical history of reflux disease, ADHD, psoriasis, constipation, anxiety, depression, vitamin D deficiency, and weight loss, who reports recurrent bad dreams and severe nightmares since 2019. She has trouble going to sleep and trouble staying asleep, she does not wake up rested.  Her sleep difficulty has been longstanding but significantly worse since 2019.   Epworth sleepiness score: 10/24.  BMI: 18.4 kg/m  FINDINGS:   Sleep Summary:   Total Recording Time (hours, min): 7 hours, 14 minutes  Total Sleep Time (hours, min):  6 hours, 33 minutes   Percent REM (%):    33.5%   Respiratory Indices:   Calculated pAHI (per hour):  1.6/hour         REM pAHI:    5.4/hour       NREM pAHI: 0.5/hour  Oxygen Saturation Statistics:    Oxygen Saturation (%) Mean: 96%   Minimum oxygen saturation (%):                 88%   O2 Saturation Range (%): 77 (?) - 99%    O2 Saturation (minutes) <=88%: 0 min   The computer-generated O2 nadir of 77% appears to be erroneous upon my review.    Pulse Rate Statistics:   Pulse Mean (bpm):    62/min    Pulse Range (44-113/min)   IMPRESSION: Snoring   RECOMMENDATION:  This home sleep test does not demonstrate any significant obstructive or central sleep disordered breathing.  Minimal intermittent snoring was noted.  The computer-generated O2 nadir of 77% appears to be erroneous upon my review.  If there is concern regarding an underlying lung disease, the patient is advised to seek evaluation through a pulmonologist.  The patient will be encouraged to talk to her primary care provider about a possible referral.   Other causes of the patient's  symptoms, including circadian rhythm disturbances, an underlying mood disorder, medication effect and/or an underlying medical problem cannot be ruled out based on this test. Clinical correlation is recommended. The patient should be cautioned not to drive, work at heights, or operate dangerous or heavy equipment when tired or sleepy. Review and reiteration of good sleep hygiene measures should be pursued with any patient. The patient can follow up with her referring provider, who will be notified of the test results. An appointment in sleep clinic can be made as necessary.   I certify that I have reviewed the raw data recording prior to the issuance of this report in accordance with the standards of the American Academy of Sleep Medicine (AASM).   INTERPRETING PHYSICIAN:   Huston Foley, MD, PhD  Board Certified in Neurology and Sleep Medicine  [facility] [address] [place], [place] [address] [phone]

## 2021-04-07 ENCOUNTER — Telehealth: Payer: Self-pay | Admitting: *Deleted

## 2021-04-07 NOTE — Telephone Encounter (Signed)
Called pt & LVM (ok per DPR) asking for call back to discuss her sleep study results. Left office number in message. Also advised results are available on mychart.

## 2021-04-07 NOTE — Telephone Encounter (Addendum)
Noted thank you. I advised the pt of this and she verbalized understanding and appreciation.

## 2021-04-07 NOTE — Telephone Encounter (Signed)
-----   Message from Andrea Foley, MD sent at 04/03/2021  6:04 PM EST ----- Please call patient and advise her that her recent home sleep test did not show any significant obstructive sleep apnea.  She had minimal intermittent snoring.  Treatment with a CPAP or AutoPap is not indicated.  She did have a couple of oxygen drops but there were a few errors in the oxygen sensor and I do believe that the computer-generated oxygen desaturation of 77% was erroneous.  I think her actual oxygen level remained at or above 88% for the whole night.  She did not have any significant time below 88% saturation for the night.  At this juncture, recommend follow-up with her current providers including her primary care and psychiatrist.  If she has any underlying lung related symptoms including history of asthma or shortness of breath, she may benefit from seeing a pulmonologist, she can discuss further with her primary care.

## 2021-04-07 NOTE — Telephone Encounter (Signed)
The chest probe is not a big concern, it limits exact evaluation of snoring and body position.

## 2021-04-07 NOTE — Telephone Encounter (Signed)
Pt returned my call. Discussed results of sleep study with her. Pt verbalized understanding of the results as noted below by Dr Frances Furbish. Pt also wanted to let the provider know that at some point during the night her chest probe came off. Pt states she sweats bad at night with her nightmares. When she woke up it was off. I told her a message would be sent to Dr Frances Furbish and I would let her know if there were any concerns given this information.

## 2021-04-09 DIAGNOSIS — Z124 Encounter for screening for malignant neoplasm of cervix: Secondary | ICD-10-CM | POA: Diagnosis not present

## 2021-04-09 DIAGNOSIS — Z01419 Encounter for gynecological examination (general) (routine) without abnormal findings: Secondary | ICD-10-CM | POA: Diagnosis not present

## 2021-04-09 DIAGNOSIS — Z1151 Encounter for screening for human papillomavirus (HPV): Secondary | ICD-10-CM | POA: Diagnosis not present

## 2021-04-10 DIAGNOSIS — F419 Anxiety disorder, unspecified: Secondary | ICD-10-CM | POA: Diagnosis not present

## 2021-04-10 DIAGNOSIS — F4389 Other reactions to severe stress: Secondary | ICD-10-CM | POA: Diagnosis not present

## 2021-04-24 DIAGNOSIS — F419 Anxiety disorder, unspecified: Secondary | ICD-10-CM | POA: Diagnosis not present

## 2021-04-24 DIAGNOSIS — F4389 Other reactions to severe stress: Secondary | ICD-10-CM | POA: Diagnosis not present
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
## 2021-05-15 ENCOUNTER — Telehealth (INDEPENDENT_AMBULATORY_CARE_PROVIDER_SITE_OTHER): Payer: Federal, State, Local not specified - PPO | Admitting: Psychiatry

## 2021-05-15 ENCOUNTER — Other Ambulatory Visit: Payer: Self-pay

## 2021-05-15 ENCOUNTER — Encounter: Payer: Self-pay | Admitting: Psychiatry

## 2021-05-15 DIAGNOSIS — G4700 Insomnia, unspecified: Secondary | ICD-10-CM

## 2021-05-15 DIAGNOSIS — F902 Attention-deficit hyperactivity disorder, combined type: Secondary | ICD-10-CM

## 2021-05-15 DIAGNOSIS — F331 Major depressive disorder, recurrent, moderate: Secondary | ICD-10-CM | POA: Diagnosis not present

## 2021-05-15 DIAGNOSIS — F515 Nightmare disorder: Secondary | ICD-10-CM | POA: Diagnosis not present

## 2021-05-15 MED ORDER — ATOMOXETINE HCL 40 MG PO CAPS: 40.0000 mg | ORAL_CAPSULE | Freq: Every day | ORAL | 1 refills | Status: DC

## 2021-05-15 NOTE — Patient Instructions (Signed)
Start strattera 40 mg daily  Discontinue Adderall Next appointment- 4/11 at 10 AM, in person  The next visit will be in person visit. Please arrive 15 mins before the scheduled time.   [facility]  Address: [address], [place], [place] [address]

## 2021-05-20 DIAGNOSIS — F4389 Other reactions to severe stress: Secondary | ICD-10-CM | POA: Diagnosis not present

## 2021-05-20 DIAGNOSIS — F419 Anxiety disorder, unspecified: Secondary | ICD-10-CM | POA: Diagnosis not present

## 2021-05-28 IMAGING — DX DG ABDOMEN 2V
2 series · 2 of 2 positions shown · non-contrast
Comparison: None.

CLINICAL DATA: Constipation and nausea

EXAM:
ABDOMEN - 2 VIEW

[abdomen erect]
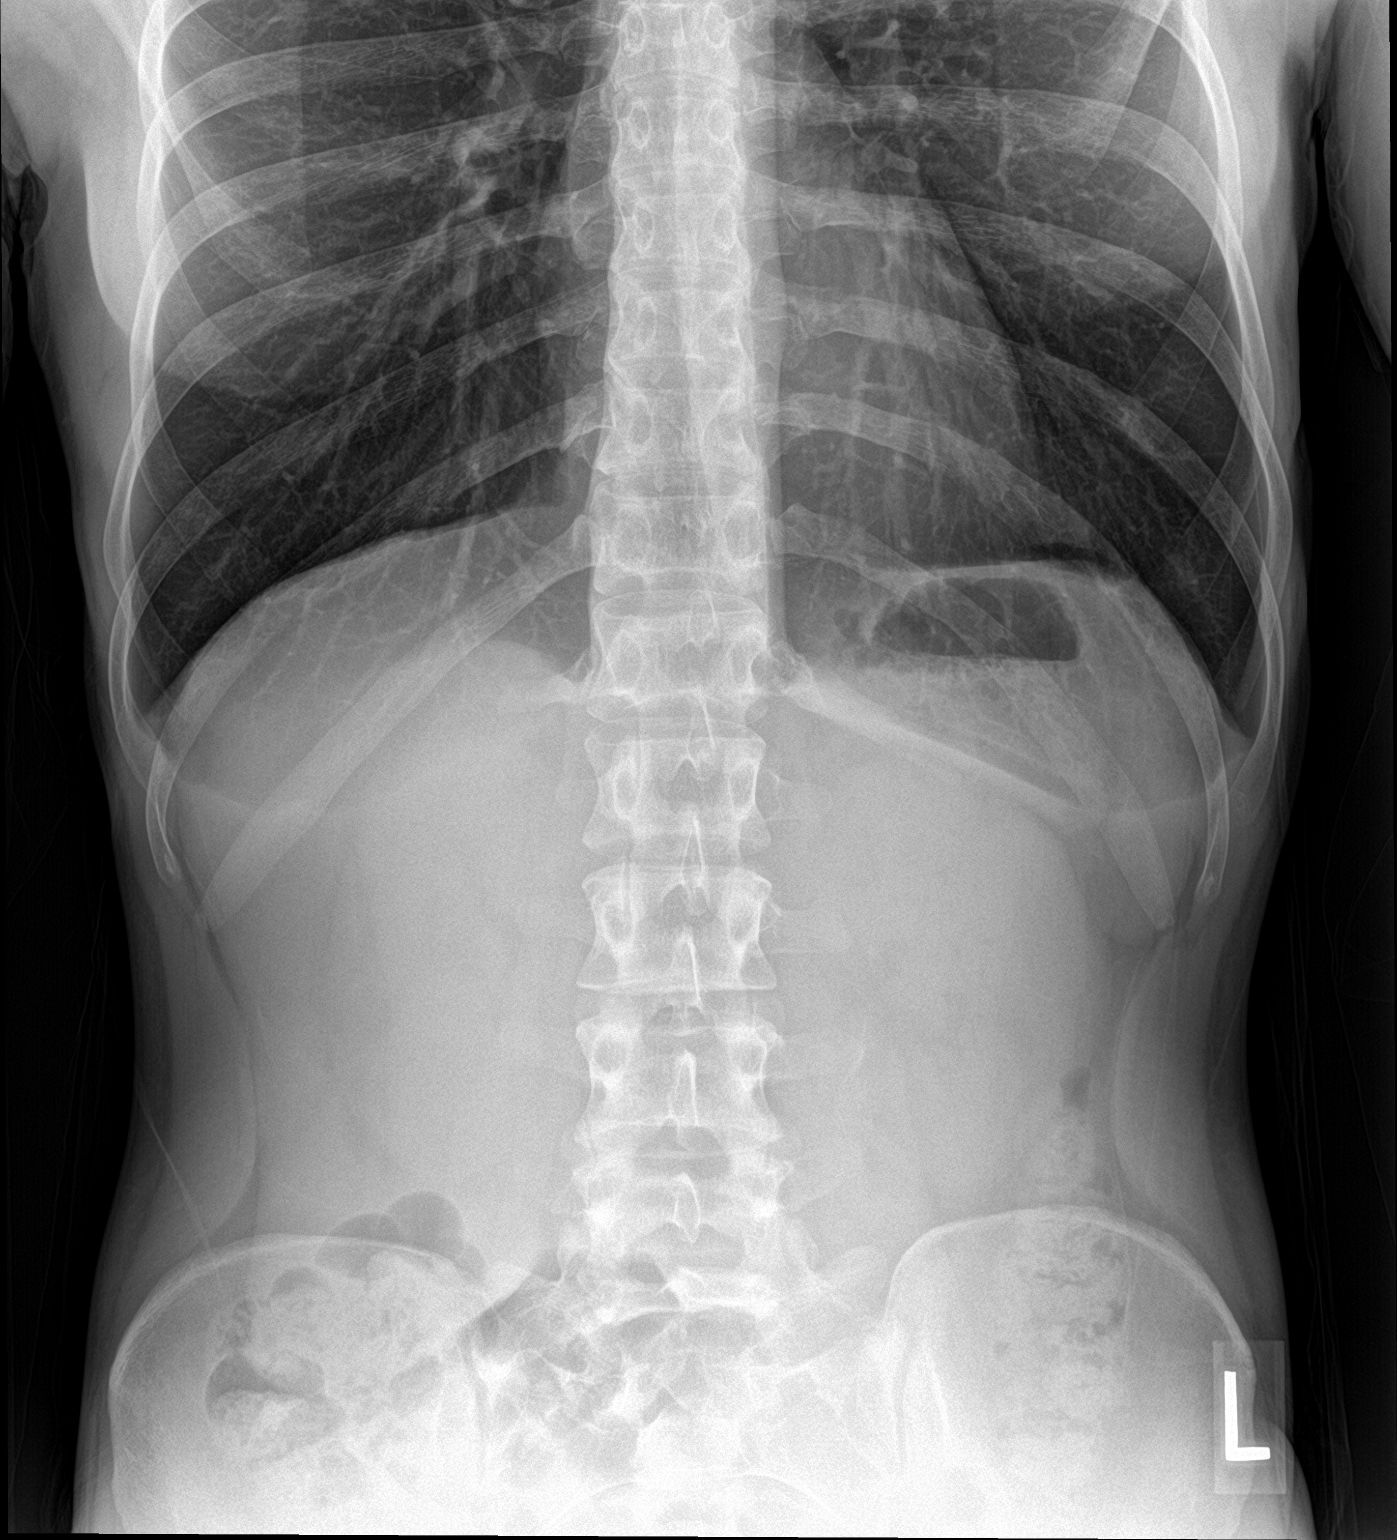

[abdomen supine]
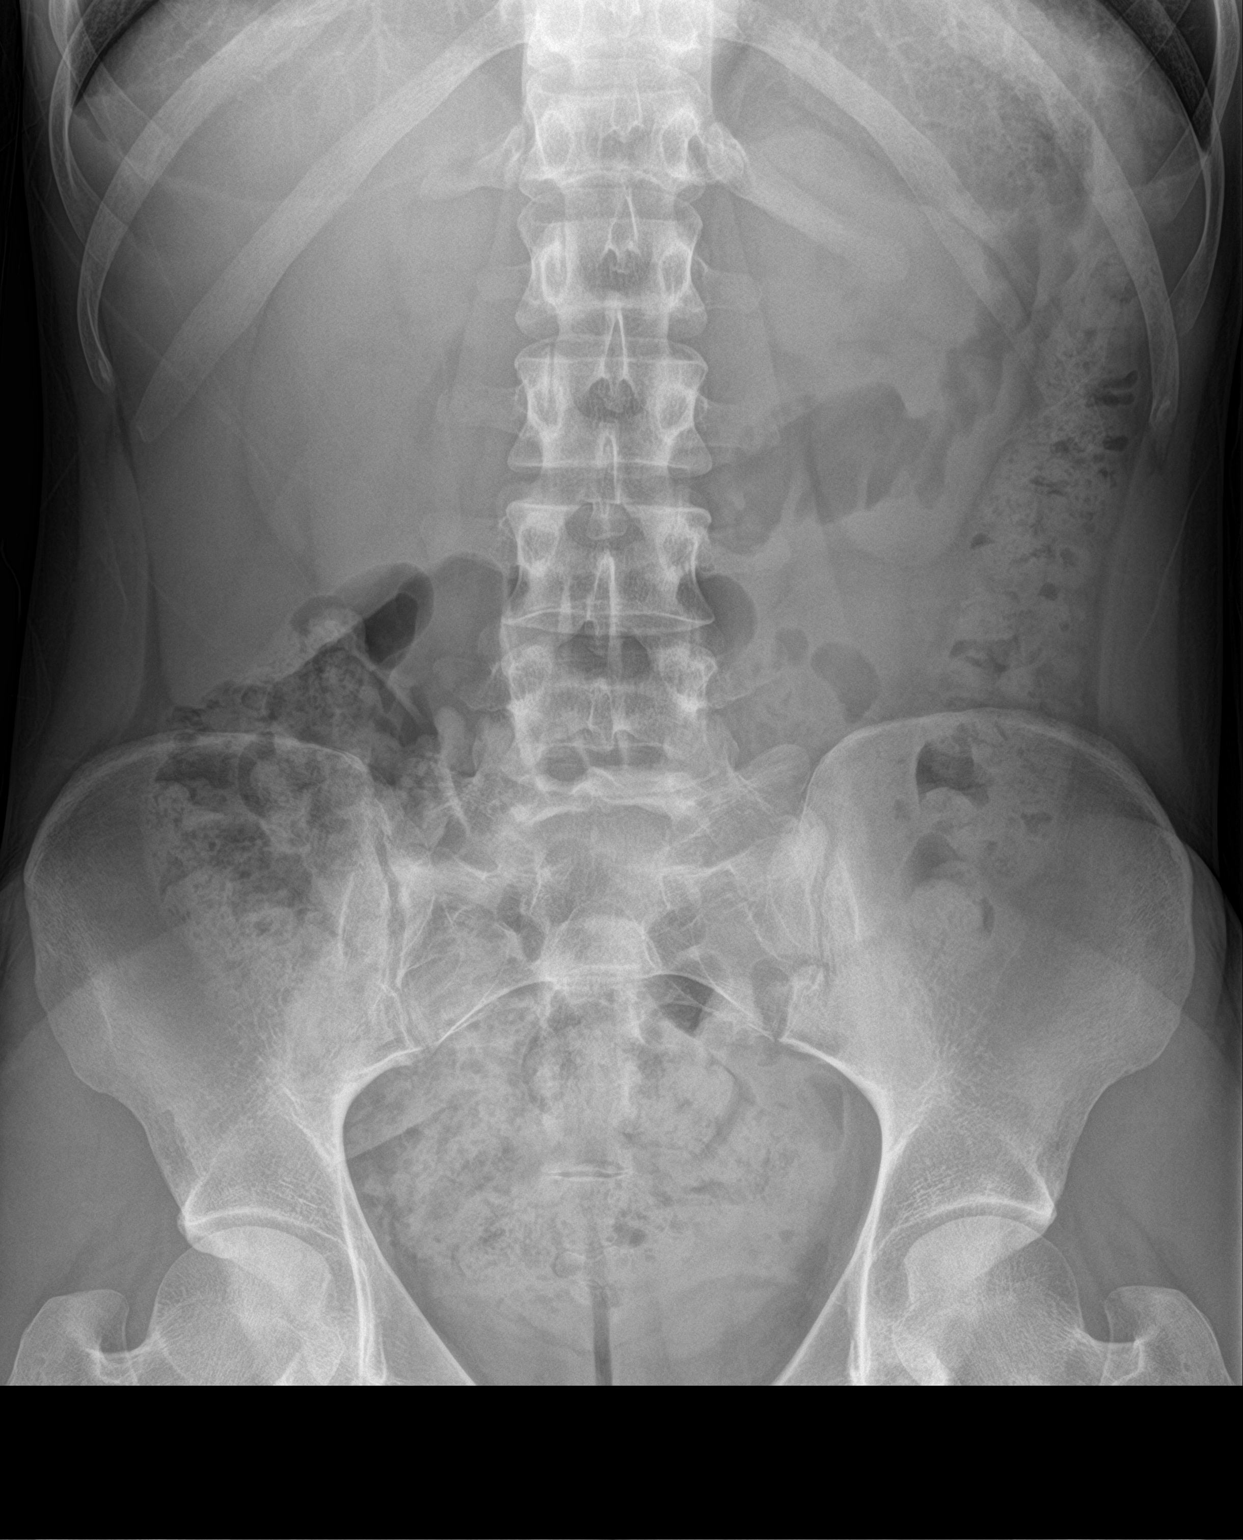

[2 of 2 positions shown; findings below may reference images not displayed]

FINDINGS: The bowel gas pattern is normal. There is no evidence of free air.
No radio-opaque calculi or other significant radiographic
abnormality is seen. Large amount of stool in the colon.
IMPRESSION: Negative. Large amount of stool in the colon.

## 2021-06-11 DIAGNOSIS — F4389 Other reactions to severe stress: Secondary | ICD-10-CM | POA: Diagnosis not present

## 2021-06-11 DIAGNOSIS — F419 Anxiety disorder, unspecified: Secondary | ICD-10-CM | POA: Diagnosis not present
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
## 2021-07-01 ENCOUNTER — Ambulatory Visit (INDEPENDENT_AMBULATORY_CARE_PROVIDER_SITE_OTHER): Payer: Federal, State, Local not specified - PPO | Admitting: Psychiatry

## 2021-07-01 ENCOUNTER — Encounter: Payer: Self-pay | Admitting: Psychiatry

## 2021-07-01 VITALS — BP 117/80 | HR 80 | Temp 98.3°F | Wt 116.0 lb

## 2021-07-01 DIAGNOSIS — F902 Attention-deficit hyperactivity disorder, combined type: Secondary | ICD-10-CM

## 2021-07-01 DIAGNOSIS — F331 Major depressive disorder, recurrent, moderate: Secondary | ICD-10-CM | POA: Diagnosis not present

## 2021-07-01 DIAGNOSIS — F515 Nightmare disorder: Secondary | ICD-10-CM

## 2021-07-01 DIAGNOSIS — G4700 Insomnia, unspecified: Secondary | ICD-10-CM

## 2021-07-01 MED ORDER — AMPHETAMINE-DEXTROAMPHETAMINE 7.5 MG PO TABS: 7.5000 mg | ORAL_TABLET | Freq: Two times a day (BID) | ORAL | 0 refills | Status: DC

## 2021-07-01 NOTE — Patient Instructions (Signed)
Start atomoxetine 40 mg daily  ?Decrease Adderall IR 7.5 mg twice a day for one week, then discontinue ?Next appointment- 6/1 at 3:30, video ?

## 2021-07-03 ENCOUNTER — Telehealth: Payer: Self-pay

## 2021-07-03 DIAGNOSIS — F419 Anxiety disorder, unspecified: Secondary | ICD-10-CM | POA: Diagnosis not present

## 2021-07-03 DIAGNOSIS — F4389 Other reactions to severe stress: Secondary | ICD-10-CM | POA: Diagnosis not present

## 2021-07-03 NOTE — Telephone Encounter (Signed)
received a request by  email that a prior auth was needed for the amphetamin-dextroamphetamine 10mg  ?

## 2021-07-08 NOTE — Telephone Encounter (Signed)
This medication was discontinued

## 2021-07-17 DIAGNOSIS — F419 Anxiety disorder, unspecified: Secondary | ICD-10-CM | POA: Diagnosis not present

## 2021-07-17 DIAGNOSIS — F4389 Other reactions to severe stress: Secondary | ICD-10-CM | POA: Diagnosis not present
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
## 2021-08-21 ENCOUNTER — Encounter: Payer: Self-pay | Admitting: Psychiatry

## 2021-08-21 ENCOUNTER — Telehealth (INDEPENDENT_AMBULATORY_CARE_PROVIDER_SITE_OTHER): Payer: Federal, State, Local not specified - PPO | Admitting: Psychiatry

## 2021-08-21 DIAGNOSIS — F515 Nightmare disorder: Secondary | ICD-10-CM

## 2021-08-21 DIAGNOSIS — F902 Attention-deficit hyperactivity disorder, combined type: Secondary | ICD-10-CM

## 2021-08-21 DIAGNOSIS — G4700 Insomnia, unspecified: Secondary | ICD-10-CM

## 2021-08-21 DIAGNOSIS — F331 Major depressive disorder, recurrent, moderate: Secondary | ICD-10-CM

## 2021-08-21 NOTE — Patient Instructions (Signed)
Start atomoxetine 40 mg daily  Next appointment- 8/24 at 4 PM, video

## 2021-08-26 DIAGNOSIS — F419 Anxiety disorder, unspecified: Secondary | ICD-10-CM | POA: Diagnosis not present

## 2021-08-26 DIAGNOSIS — F4389 Other reactions to severe stress: Secondary | ICD-10-CM | POA: Diagnosis not present

## 2021-09-02 DIAGNOSIS — L219 Seborrheic dermatitis, unspecified: Secondary | ICD-10-CM | POA: Diagnosis not present

## 2021-09-02 DIAGNOSIS — L700 Acne vulgaris: Secondary | ICD-10-CM | POA: Diagnosis not present

## 2021-09-02 DIAGNOSIS — L400 Psoriasis vulgaris: Secondary | ICD-10-CM | POA: Diagnosis not present

## 2021-09-02 DIAGNOSIS — L710 Perioral dermatitis: Secondary | ICD-10-CM | POA: Diagnosis not present

## 2021-09-16 DIAGNOSIS — F4389 Other reactions to severe stress: Secondary | ICD-10-CM | POA: Diagnosis not present

## 2021-09-16 DIAGNOSIS — F419 Anxiety disorder, unspecified: Secondary | ICD-10-CM | POA: Diagnosis not present

## 2021-10-14 DIAGNOSIS — F4389 Other reactions to severe stress: Secondary | ICD-10-CM | POA: Diagnosis not present

## 2021-10-14 DIAGNOSIS — F419 Anxiety disorder, unspecified: Secondary | ICD-10-CM | POA: Diagnosis not present

## 2021-10-23 ENCOUNTER — Ambulatory Visit
Admission: EM | Admit: 2021-10-23 | Discharge: 2021-10-23 | Disposition: A | Discharge status: Home / Self Care | Payer: Federal, State, Local not specified - PPO | Attending: Family Medicine | Admitting: Family Medicine

## 2021-10-23 ENCOUNTER — Ambulatory Visit: Admit: 2021-10-23 | Discharge status: Absent without leave | Payer: Federal, State, Local not specified - PPO

## 2021-10-23 DIAGNOSIS — R059 Cough, unspecified: Secondary | ICD-10-CM | POA: Diagnosis not present

## 2021-10-23 DIAGNOSIS — J0100 Acute maxillary sinusitis, unspecified: Secondary | ICD-10-CM | POA: Diagnosis not present

## 2021-10-23 DIAGNOSIS — J309 Allergic rhinitis, unspecified: Secondary | ICD-10-CM | POA: Diagnosis not present

## 2021-10-23 MED ORDER — BENZONATATE 200 MG PO CAPS: 200.0000 mg | ORAL_CAPSULE | Freq: Three times a day (TID) | ORAL | 0 refills | Status: AC | PRN

## 2021-10-23 MED ORDER — AMOXICILLIN 875 MG PO TABS: 875.0000 mg | ORAL_TABLET | Freq: Two times a day (BID) | ORAL | 0 refills | Status: AC

## 2021-10-23 MED ORDER — FEXOFENADINE HCL 180 MG PO TABS: 180.0000 mg | ORAL_TABLET | Freq: Every day | ORAL | 0 refills | Status: DC

## 2021-10-23 NOTE — ED Triage Notes (Addendum)
Pt c/o cough and congestion since Monday. Started with sore throat. (Hoarse) Sore throat somewhat improved. Now having fatigue, headache, bodyaches and ear pain. Taking tylenol and mucinex fastmax prn.

## 2021-10-23 NOTE — Discharge Instructions (Addendum)
Advised patient to discontinue Mucinex and to start Allegra daily for the next 5 days . Advised may take Allegra as needed afterwards for concurrent postnasal drainage/drip.  Advised if symptoms worsen may start Amoxicillin.  Advised patient if starting this antibiotic take as directed with food to completion.  Advised may use Tessalon Perles daily or as needed for cough.  Encouraged increase in daily water intake while taking these medications.  Advised if symptoms worsen and/or unresolved please follow-up with PCP or here for further evaluation.

## 2021-10-23 NOTE — ED Provider Notes (Signed)
[facility]    CSN: [id number] Arrival date & time: 10/23/21  1037      History   Chief Complaint Chief Complaint  Patient presents with   Cough   Nasal Congestion   Sore Throat    Hoarse    HPI Andrea Baker is a 31 y.o. female.   HPI 31 year old female presents with sore throat, cough, and chest congestion for days PMH significant for GERD, GAD, brain fog, and ADHD  Past Medical History:  Diagnosis Date   Acne    Anxiety and depression 05/02/2013   Intolerant to lexapro and effexor    Constipation    GERD 10/19/2009   Failed omeprazole and protonix.    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   Psoriasis 10/24/2018   GAD (generalized anxiety disorder) 10/13/2018   MDD (major depressive disorder), recurrent episode, moderate (HCC) 10/13/2018   Dyspepsia 10/13/2018   Non-intractable vomiting    Insomnia 06/06/2018   Panic attacks 05/02/2018   Unintentional weight loss 05/02/2018   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   Grade I hemorrhoids 06/08/2017   Seborrheic dermatitis of scalp 08/11/2016   Canker sores oral 10/28/2015   Chronic idiopathic constipation 06/12/2015   Eczema 05/17/2013   Anxiety and depression 05/02/2013   Concentration deficit 05/02/2013   Allergic rhinitis 08/07/2011   Gastroesophageal reflux disease 10/19/2009    Past Surgical History:  Procedure Laterality Date   ESOPHAGEAL MANOMETRY N/A 09/12/2018   Procedure: ESOPHAGEAL MANOMETRY (EM);  Surgeon: Napoleon Form, MD;  Location: [facility];  Service: Endoscopy;  Laterality: N/A;   ESOPHAGOGASTRODUODENOSCOPY     x2 have been normal  and gastric emptying has been normal    ESOPHAGOGASTRODUODENOSCOPY     gastric empyting study      PH IMPEDANCE STUDY N/A 09/12/2018   Procedure: PH IMPEDANCE STUDY;  Surgeon: Napoleon Form, MD;  Location: [facility];  Service: Endoscopy;  Laterality: N/A;    OB History   No obstetric history on file.      Home Medications    Prior to Admission medications   Medication Sig Start Date End Date Taking? Authorizing Provider  amoxicillin (AMOXIL) 875 MG tablet Take 1 tablet (875 mg total) by mouth 2 (two) times daily for 7 days. 10/23/21 10/30/21 Yes Trevor Iha, FNP  benzonatate (TESSALON) 200 MG capsule Take 1 capsule (200 mg total) by mouth 3 (three) times daily as needed for up to 7 days. 10/23/21 10/30/21 Yes Trevor Iha, FNP  fexofenadine [facility] ALLERGY) 180 MG tablet Take 1 tablet (180 mg total) by mouth daily for 15 days. 10/23/21 11/07/21 Yes Trevor Iha, FNP  Alum Hydroxide-Mag Carbonate (GAVISCON PO) Take by mouth as needed.    [provider]  AMBULATORY NON FORMULARY MEDICATION FD Guard  Mega Mucosa Candi Bactin    [provider]  atomoxetine (STRATTERA) 40 MG capsule Take 1 capsule (40 mg total) by mouth daily. 05/15/21 07/14/21  [name], MD  B Complex Vitamins (VITAMIN B-COMPLEX PO) Take by mouth.    [provider]  clonazePAM (KLONOPIN) 0.5 MG tablet as needed.  [provider]  MAGNESIUM PO Take by mouth.    [provider]  Probiotic Product (PROBIOTIC DAILY PO) Take by mouth.    [provider]  spironolactone (ALDACTONE) 100 MG tablet Take 100 mg by mouth daily.    [provider]  VITAMIN D PO Take 2,000 Units by mouth daily.    [provider]    Family History Family History  Problem Relation Age of Onset   Depression Mother    Heart attack Father    Diabetes Father        prediabetic   Hypertension Father    Hyperlipidemia Father    Diabetes Paternal Grandmother     Colon cancer Neg Hx    Esophageal cancer Neg Hx    Rectal cancer Neg Hx    Stomach cancer Neg Hx     Social History Social History   Tobacco Use   Smoking status: Never   Smokeless tobacco: Never  Vaping Use   Vaping Use: Never used  Substance Use Topics   Alcohol use: Yes    Comment: Hard selzer once very 3 months. Cant tolerate alcoloh   Drug use: No     Allergies   Augmentin [amoxicillin-pot clavulanate], Doxycycline, and Pristiq [desvenlafaxine]   Review of Systems Review of Systems  HENT:  Positive for congestion and sore throat.   Respiratory:  Positive for cough.   All other systems reviewed and are negative.    Physical Exam Triage Vital Signs ED Triage Vitals  Enc Vitals Group     BP      Pulse      Resp      Temp      Temp src      SpO2      Weight      Height      Head Circumference      Peak Flow      Pain Score      Pain Loc      Pain Edu?      Excl. in GC?    No data found.  Updated Vital Signs BP 112/74 (BP Location: Right Arm)   Pulse 78   Temp 99.2 F (37.3 C) (Oral)   Resp 18   SpO2 98%    Physical Exam Vitals and nursing note reviewed.  Constitutional:      General: She is not in acute distress.    Appearance: She is well-developed and normal weight. She is not ill-appearing.  HENT:     Head: Normocephalic and atraumatic.     Right Ear: Tympanic membrane and external ear normal.     Left Ear: Tympanic membrane and external ear normal.     Ears:     Comments: Moderate to significant eustachian tube dysfunction noted bilaterally    Nose:     Comments: Turbinates are erythematous/edematous    Mouth/Throat:     Mouth: Mucous membranes are moist.     Pharynx: Oropharynx is clear. Uvula midline.     Comments: Significant amount of clear drainage of posterior oropharynx noted Eyes:     Extraocular Movements: Extraocular movements intact.     Conjunctiva/sclera: Conjunctivae normal.     Pupils: Pupils are equal, round, and  reactive to light.  Cardiovascular:     Rate and Rhythm: Normal rate and regular rhythm.     Pulses: Normal pulses.     Heart sounds: Normal heart sounds. No murmur heard. Pulmonary:     Effort:  Pulmonary effort is normal.     Breath sounds: Normal breath sounds. No wheezing, rhonchi or rales.     Comments: Infrequent nonproductive cough noted Musculoskeletal:     Cervical back: Normal range of motion and neck supple.  Skin:    General: Skin is warm and dry.  Neurological:     General: No focal deficit present.     Mental Status: She is alert and oriented to person, place, and time.      UC Treatments / Results  Labs (all labs ordered are listed, but only abnormal results are displayed) Labs Reviewed - No data to display  EKG   Radiology No results found.  Procedures Procedures (including critical care time)  Medications Ordered in UC Medications - No data to display  Initial Impression / Assessment and Plan / UC Course  I have reviewed the triage vital signs and the nursing notes.  Pertinent labs & imaging results that were available during my care of the patient were reviewed by me and considered in my medical decision making (see chart for details).     MDM: 1.  Subacute maxillary sinusitis-Rx'd Amoxicillin (patient reports taking this antibiotic previously without adverse reactions); 2.  Allergic rhinitis-Rx'd Allegra; 3.  Cough-Rx'd Tessalon Perles.  Patient discharged home, hemodynamically stable. Advised patient to discontinue Mucinex and to start Allegra daily for the next 5 days . Advised may take Allegra as needed afterwards for concurrent postnasal drainage/drip.  Advised if symptoms worsen may start Amoxicillin.  Advised patient if starting this antibiotic take as directed with food to completion.  Advised may use Tessalon Perles daily or as needed for cough.  Encouraged increase in daily water intake while taking these medications.  Advised if symptoms worsen  and/or unresolved please follow-up with PCP or here for further evaluation. Final Clinical Impressions(s) / UC Diagnoses   Final diagnoses:  Allergic rhinitis, unspecified seasonality, unspecified trigger  Subacute maxillary sinusitis  Cough, unspecified type     Discharge Instructions      Advised patient to discontinue Mucinex and to start Allegra daily for the next 5 days . Advised may take Allegra as needed afterwards for concurrent postnasal drainage/drip.  Advised if symptoms worsen may start Amoxicillin.  Advised patient if starting this antibiotic take as directed with food to completion.  Advised may use Tessalon Perles daily or as needed for cough.  Encouraged increase in daily water intake while taking these medications.  Advised if symptoms worsen and/or unresolved please follow-up with PCP or here for further evaluation.     ED Prescriptions     Medication Sig Dispense Auth. Provider   amoxicillin (AMOXIL) 875 MG tablet Take 1 tablet (875 mg total) by mouth 2 (two) times daily for 7 days. 14 tablet Trevor Iha, FNP   fexofenadine [facility] ALLERGY) 180 MG tablet Take 1 tablet (180 mg total) by mouth daily for 15 days. 15 tablet Trevor Iha, FNP   benzonatate (TESSALON) 200 MG capsule Take 1 capsule (200 mg total) by mouth 3 (three) times daily as needed for up to 7 days. 40 capsule Trevor Iha, FNP      PDMP not reviewed this encounter.   Trevor Iha, FNP 10/23/21 1117

## 2021-10-24 ENCOUNTER — Telehealth: Payer: Self-pay | Admitting: Emergency Medicine

## 2021-10-24 NOTE — Telephone Encounter (Signed)
LMTRC.  Advised if patient is doing well can disregard the call, any questions or concerns feel free to give the office a call back.
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
## 2021-11-13 ENCOUNTER — Ambulatory Visit
Admission: RE | Admit: 2021-11-13 | Discharge: 2021-11-13 | Disposition: A | Discharge status: Home / Self Care | Payer: Federal, State, Local not specified - PPO | Source: Ambulatory Visit | Attending: Family Medicine | Admitting: Family Medicine

## 2021-11-13 ENCOUNTER — Telehealth: Payer: Federal, State, Local not specified - PPO | Admitting: Psychiatry

## 2021-11-13 VITALS — BP 103/67 | HR 81 | Temp 99.2°F | Resp 16 | Ht 65.0 in | Wt 117.0 lb

## 2021-11-13 DIAGNOSIS — R058 Other specified cough: Secondary | ICD-10-CM | POA: Diagnosis not present

## 2021-11-13 MED ORDER — PREDNISONE 20 MG PO TABS: ORAL_TABLET | ORAL | 0 refills | Status: DC

## 2021-11-13 MED ORDER — BENZONATATE 200 MG PO CAPS: 200.0000 mg | ORAL_CAPSULE | Freq: Three times a day (TID) | ORAL | 0 refills | Status: DC | PRN

## 2021-11-13 MED ORDER — GUAIFENESIN-CODEINE 100-10 MG/5ML PO SOLN: 10.0000 mL | Freq: Four times a day (QID) | ORAL | 0 refills | Status: DC | PRN

## 2021-11-13 NOTE — ED Triage Notes (Signed)
Per pt - "Was seen a few weeks back dx w/sinus infection. Cough has persisted ever since and seems to be ramping back up again, feels like it settled in my chest and can't get rid of it. Chest is sore from coughing"  Cough is worse at night Hard to have conversations when talking at work Feels as though it has settled  in her chest Denies fevers

## 2021-11-13 NOTE — Discharge Instructions (Signed)
Continue to drink lots of fluids Take prednisone as directed.  40 mg a day for 5 days then drop to 20 mg a day for 5 days, then discontinue For daytime cough use Tessalon plus a DM product as we discussed I have prescribed a cough medicine with codeine to take at bedtime.  May take during the day when you do not have to drive or work Should improve over the next couple of weeks See your primary care doctor for persistent symptoms

## 2021-11-13 NOTE — ED Provider Notes (Signed)
[facility]    CSN: [id number] Arrival date & time: 11/13/21  1400      History   Chief Complaint Chief Complaint  Patient presents with   Cough    HPI Andrea Baker is a 31 y.o. female.   HPI  Patient was seen for sinus and upper respiratory infection October 24, 2021.  She was treated with amoxicillin, Tessalon, and a few days of prednisone.  She states she briefly felt better but the cough is never completely gone away.  She has had a dry cough ever since.  Over the last week the dry cough is getting worse, she is coughing more often, she has chest pain from the coughing.  It is not responding to the Tessalon she was given for her cough for 2 over-the-counter cough medicines.  No fever chills or body aches.  No sputum production.  No underlying lung disease  Past Medical History:  Diagnosis Date   Acne    Anxiety and depression 05/02/2013   Intolerant to lexapro and effexor    Constipation    GERD 10/19/2009   Failed omeprazole and protonix.    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   Psoriasis 10/24/2018   GAD (generalized anxiety disorder) 10/13/2018   MDD (major depressive disorder), recurrent episode, moderate (HCC) 10/13/2018   Dyspepsia 10/13/2018   Insomnia 06/06/2018   Panic attacks 05/02/2018   Unintentional weight loss 05/02/2018   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   Grade I hemorrhoids 06/08/2017   Seborrheic dermatitis of scalp 08/11/2016   Chronic idiopathic constipation 06/12/2015   Eczema 05/17/2013   Anxiety and depression 05/02/2013   Allergic rhinitis 08/07/2011   Gastroesophageal reflux disease  10/19/2009    Past Surgical History:  Procedure Laterality Date   ESOPHAGEAL MANOMETRY N/A 09/12/2018   Procedure: ESOPHAGEAL MANOMETRY (EM);  Surgeon: Napoleon Form, MD;  Location: [facility];  Service: Endoscopy;  Laterality: N/A;   ESOPHAGOGASTRODUODENOSCOPY     x2 have been normal and gastric emptying has been normal    ESOPHAGOGASTRODUODENOSCOPY     gastric empyting study      PH IMPEDANCE STUDY N/A 09/12/2018   Procedure: PH IMPEDANCE STUDY;  Surgeon: Napoleon Form, MD;  Location: [facility];  Service: Endoscopy;  Laterality: N/A;    OB History   No obstetric history on file.      Home Medications    Prior to Admission medications   Medication Sig Start Date End Date Taking? Authorizing Provider  benzonatate (TESSALON) 200 MG capsule Take 1 capsule (200 mg total) by mouth 3 (three) times daily as needed for cough. 11/13/21  Yes Eustace Moore, MD  guaiFENesin-codeine 100-10 MG/5ML syrup Take 10 mLs by mouth every 6 (six) hours as needed for cough. 11/13/21  Yes Eustace Moore, MD  predniSONE (DELTASONE) 20 MG tablet Take 2 pills a day with food for 5 days, then reduce to 1 pill a day for 5 days, then discontinue 11/13/21  Yes Eustace Moore, MD  Prenatal Vit-Fe Fumarate-FA (PRENATAL MULTIVITAMIN) TABS tablet Take 1 tablet by mouth daily at 12 noon.  Yes [provider]  Alum Hydroxide-Mag Carbonate (GAVISCON PO) Take by mouth as needed.    [provider]  AMBULATORY NON FORMULARY MEDICATION FD Guard prn    [provider]  B Complex Vitamins (VITAMIN B-COMPLEX PO) Take by mouth.    [provider]  MAGNESIUM PO Take by mouth daily as needed.    [provider]  Probiotic Product (PROBIOTIC DAILY PO) Take by mouth.    [provider]    Family History Family History  Problem Relation Age of Onset   Depression Mother    Heart attack Father    Diabetes Father        prediabetic   Hypertension  Father    Hyperlipidemia Father    Diabetes Paternal Grandmother    Colon cancer Neg Hx    Esophageal cancer Neg Hx    Rectal cancer Neg Hx    Stomach cancer Neg Hx     Social History Social History   Tobacco Use   Smoking status: Never   Smokeless tobacco: Never  Vaping Use   Vaping Use: Never used  Substance Use Topics   Alcohol use: Yes    Comment: Hard selzer once very 3 months. Cant tolerate alcoloh   Drug use: No     Allergies   Augmentin [amoxicillin-pot clavulanate], Doxycycline, and Pristiq [desvenlafaxine]   Review of Systems Review of Systems See HPI  Physical Exam Triage Vital Signs ED Triage Vitals  Enc Vitals Group     BP 11/13/21 1423 103/67     Pulse Rate 11/13/21 1423 81     Resp 11/13/21 1423 16     Temp 11/13/21 1423 99.2 F (37.3 C)     Temp Source 11/13/21 1423 Oral     SpO2 11/13/21 1423 98 %     Weight 11/13/21 1426 117 lb (53.1 kg)     Height 11/13/21 1426 5\' 5"  (1.651 m)     Head Circumference --      Peak Flow --      Pain Score 11/13/21 1425 3     Pain Loc --      Pain Edu? --      Excl. in GC? --    No data found.  Updated Vital Signs BP 103/67 (BP Location: Left Arm)   Pulse 81   Temp 99.2 F (37.3 C) (Oral)   Resp 16   Ht 5\' 5"  (1.651 m)   Wt 53.1 kg   LMP 10/25/2021 (Exact Date)   SpO2 98%   BMI 19.47 kg/m      Physical Exam Constitutional:      General: She is not in acute distress.    Appearance: She is well-developed.     Comments: Lean  HENT:     Head: Normocephalic and atraumatic.     Right Ear: Tympanic membrane and ear canal normal.     Left Ear: Tympanic membrane and ear canal normal.     Nose: No congestion.     Mouth/Throat:     Mouth: Mucous membranes are moist.     Pharynx: No posterior oropharyngeal erythema.  Eyes:     Conjunctiva/sclera: Conjunctivae normal.     Pupils: Pupils are equal, round, and reactive to light.  Cardiovascular:     Rate and Rhythm: Normal rate and regular rhythm.      Heart sounds: Normal heart sounds.  Pulmonary:     Effort: Pulmonary effort is normal. No respiratory distress.  Breath sounds: Normal breath sounds.     Comments: Mild sternal tenderness Chest:     Chest wall: Tenderness present.  Abdominal:     General: There is no distension.     Palpations: Abdomen is soft.  Musculoskeletal:        General: Normal range of motion.     Cervical back: Normal range of motion.  Skin:    General: Skin is warm and dry.  Neurological:     Mental Status: She is alert.      UC Treatments / Results  Labs (all labs ordered are listed, but only abnormal results are displayed) Labs Reviewed - No data to display  EKG   Radiology No results found.  Procedures Procedures (including critical care time)  Medications Ordered in UC Medications - No data to display  Initial Impression / Assessment and Plan / UC Course  I have reviewed the triage vital signs and the nursing notes.  Pertinent labs & imaging results that were available during my care of the patient were reviewed by me and considered in my medical decision making (see chart for details).     I do not think the patient has an active infection causing her chronic cough.  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 Final Clinical Impressions(s) / UC Diagnoses   Final diagnoses:  Post-viral cough syndrome     Discharge Instructions      Continue to drink lots of fluids Take prednisone as directed.  40 mg a day for 5 days then drop to 20 mg a day for 5 days, then discontinue For daytime cough use Tessalon plus a DM product as we discussed I have prescribed a cough medicine with codeine to take at bedtime.  May take during the day when you do not have to drive or work Should improve over the next couple of weeks See your primary care doctor for persistent symptoms   ED Prescriptions      Medication Sig Dispense Auth. Provider   benzonatate (TESSALON) 200 MG capsule Take 1 capsule (200 mg total) by mouth 3 (three) times daily as needed for cough. 21 capsule Eustace Moore, MD   predniSONE (DELTASONE) 20 MG tablet Take 2 pills a day with food for 5 days, then reduce to 1 pill a day for 5 days, then discontinue 15 tablet Eustace Moore, MD   guaiFENesin-codeine 100-10 MG/5ML syrup Take 10 mLs by mouth every 6 (six) hours as needed for cough. 120 mL Eustace Moore, MD      PDMP not reviewed this encounter.   Eustace Moore, MD 11/13/21 [phone]

## 2021-11-18 DIAGNOSIS — F419 Anxiety disorder, unspecified: Secondary | ICD-10-CM | POA: Diagnosis not present

## 2021-11-18 DIAGNOSIS — F4389 Other reactions to severe stress: Secondary | ICD-10-CM | POA: Diagnosis not present

## 2021-12-23 DIAGNOSIS — F4389 Other reactions to severe stress: Secondary | ICD-10-CM | POA: Diagnosis not present

## 2021-12-23 DIAGNOSIS — F419 Anxiety disorder, unspecified: Secondary | ICD-10-CM | POA: Diagnosis not present

## 2022-01-05 DIAGNOSIS — F4389 Other reactions to severe stress: Secondary | ICD-10-CM | POA: Diagnosis not present

## 2022-01-05 DIAGNOSIS — F419 Anxiety disorder, unspecified: Secondary | ICD-10-CM | POA: Diagnosis not present

## 2022-01-22 DIAGNOSIS — F419 Anxiety disorder, unspecified: Secondary | ICD-10-CM | POA: Diagnosis not present

## 2022-01-22 DIAGNOSIS — F4389 Other reactions to severe stress: Secondary | ICD-10-CM | POA: Diagnosis not present

## 2022-02-04 DIAGNOSIS — F4389 Other reactions to severe stress: Secondary | ICD-10-CM | POA: Diagnosis not present

## 2022-02-04 DIAGNOSIS — F419 Anxiety disorder, unspecified: Secondary | ICD-10-CM | POA: Diagnosis not present

## 2022-02-10 ENCOUNTER — Ambulatory Visit
Admission: EM | Admit: 2022-02-10 | Discharge: 2022-02-10 | Disposition: A | Discharge status: Home / Self Care | Payer: Federal, State, Local not specified - PPO

## 2022-02-10 ENCOUNTER — Ambulatory Visit (INDEPENDENT_AMBULATORY_CARE_PROVIDER_SITE_OTHER): Payer: Federal, State, Local not specified - PPO

## 2022-02-10 DIAGNOSIS — R52 Pain, unspecified: Secondary | ICD-10-CM

## 2022-02-10 DIAGNOSIS — R519 Headache, unspecified: Secondary | ICD-10-CM | POA: Diagnosis not present

## 2022-02-10 DIAGNOSIS — J0140 Acute pansinusitis, unspecified: Secondary | ICD-10-CM | POA: Diagnosis not present

## 2022-02-10 DIAGNOSIS — R079 Chest pain, unspecified: Secondary | ICD-10-CM | POA: Diagnosis not present

## 2022-02-10 DIAGNOSIS — R5383 Other fatigue: Secondary | ICD-10-CM | POA: Diagnosis not present

## 2022-02-10 DIAGNOSIS — R0989 Other specified symptoms and signs involving the circulatory and respiratory systems: Secondary | ICD-10-CM

## 2022-02-10 DIAGNOSIS — R067 Sneezing: Secondary | ICD-10-CM

## 2022-02-10 LAB — POCT URINE PREGNANCY: Preg Test, Ur: NEGATIVE

## 2022-02-10 MED ORDER — CEFDINIR 300 MG PO CAPS: 300.0000 mg | ORAL_CAPSULE | Freq: Two times a day (BID) | ORAL | 0 refills | Status: DC

## 2022-02-10 MED ORDER — ALBUTEROL SULFATE HFA 108 (90 BASE) MCG/ACT IN AERS
2.0000 | INHALATION_SPRAY | Freq: Once | RESPIRATORY_TRACT | Status: AC
  Administered 2022-02-10: 2 via RESPIRATORY_TRACT

## 2022-02-10 NOTE — ED Triage Notes (Signed)
Pt presents to Urgent Care with c/o onset of runny nose/sneezing one week ago and developed body aches, fatigue, and facial pain over the past several days. Afebrile. Recent exposure to the flu; pt vaccinated for flu in October.

## 2022-02-10 NOTE — Discharge Instructions (Signed)
Take cefdinir twice daily for 10 days.  Start prednisone burst from your previous prescription.  Do not take NSAIDs with this medication including aspirin, ibuprofen/Advil, naproxen/Aleve.  Use Mucinex and Flonase for additional symptom relief.  Rest and drink plenty of fluid.  If your symptoms are improving or if anything worsens please return for reevaluation.

## 2022-02-10 NOTE — ED Provider Notes (Signed)
[facility]    CSN: [id number] Arrival date & time: 02/10/22  1019      History   Chief Complaint Chief Complaint  Patient presents with   Facial Pain   Generalized Body Aches   Nasal Congestion    HPI Andrea Baker is a 30 y.o. female.   Patient presents today with 1 week history of URI symptoms.  Reports nasal congestion, sinus pressure, mild cough, headache.  Denies any chest pain, nausea, vomiting, diarrhea.  Does report mild shortness of breath particularly with activity that is unusual for her.  She initially thought symptoms were related to the flu as she has been exposed to many people who were diagnosed with the flu after recent trip to [place]  She has had her influenza vaccine as well as 2 COVID-19 vaccinations.  She has not had COVID in the past.  She denies any recent antibiotics or steroids.  Has tried over-the-counter medications without improvement of symptoms.  She does have a history of sinus infections but states current symptoms are not identical to previous episodes of this condition.  She does not believe that she is pregnant but is not confident that she is not.    Past Medical History:  Diagnosis Date   Acne    Anxiety and depression 05/02/2013   Intolerant to lexapro and effexor    Constipation    GERD 10/19/2009   Failed omeprazole and protonix.    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   Psoriasis 10/24/2018   GAD (generalized anxiety disorder) 10/13/2018   MDD (major depressive disorder), recurrent episode, moderate (HCC) 10/13/2018   Dyspepsia 10/13/2018   Insomnia 06/06/2018   Panic  attacks 05/02/2018   Unintentional weight loss 05/02/2018   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   Grade I hemorrhoids 06/08/2017   Seborrheic dermatitis of scalp 08/11/2016   Chronic idiopathic constipation 06/12/2015   Eczema 05/17/2013   Anxiety and depression 05/02/2013   Allergic rhinitis 08/07/2011   Gastroesophageal reflux disease 10/19/2009    Past Surgical History:  Procedure Laterality Date   ESOPHAGEAL MANOMETRY N/A 09/12/2018   Procedure: ESOPHAGEAL MANOMETRY (EM);  Surgeon: Napoleon Form, MD;  Location: [facility];  Service: Endoscopy;  Laterality: N/A;   ESOPHAGOGASTRODUODENOSCOPY     x2 have been normal and gastric emptying has been normal    ESOPHAGOGASTRODUODENOSCOPY     gastric empyting study      PH IMPEDANCE STUDY N/A 09/12/2018   Procedure: PH IMPEDANCE STUDY;  Surgeon: Napoleon Form, MD;  Location: [facility];  Service: Endoscopy;  Laterality: N/A;    OB History   No obstetric history on file.      Home Medications    Prior to Admission medications   Medication Sig Start Date End Date Taking? Authorizing Provider  cefdinir (OMNICEF) 300 MG capsule Take 1 capsule (300 mg total) by mouth 2 (two) times daily. 02/10/22  Yes Arlen Dupuis K, PA-C  pseudoephedrine (SUDAFED) 30 MG tablet Take 30 mg by mouth every 4 (four) hours as needed for congestion.   Yes [provider]  Alum Hydroxide-Mag  Carbonate (GAVISCON PO) Take by mouth as needed.    [provider]  AMBULATORY NON FORMULARY MEDICATION FD Guard prn    [provider]  ondansetron (ZOFRAN-ODT) 4 MG disintegrating tablet DISSOLVE ONE tablet ON THE TONGUE EVERY 8 HOURS AS NEEDED FOR NAUSEA AND VOMITING    [provider]  predniSONE (DELTASONE) 20 MG tablet Take 2 pills a day with food for 5 days, then reduce to 1 pill a day for 5 days, then discontinue 11/13/21   Eustace MooreNelson, Yvonne Sue, MD  Probiotic Product (PROBIOTIC DAILY PO) Take by mouth.    [provider]    Family History Family History  Problem Relation Age of Onset   Depression Mother    Heart attack Father    Diabetes Father        prediabetic   Hypertension Father    Hyperlipidemia Father    Diabetes Paternal Grandmother    Colon cancer Neg Hx    Esophageal cancer Neg Hx    Rectal cancer Neg Hx    Stomach cancer Neg Hx     Social History Social History   Tobacco Use   Smoking status: Never   Smokeless tobacco: Never  Vaping Use   Vaping Use: Never used  Substance Use Topics   Alcohol use: Yes    Comment: occasionally   Drug use: No     Allergies   Augmentin [amoxicillin-pot clavulanate], Doxycycline, and Pristiq [desvenlafaxine]   Review of Systems Review of Systems  Constitutional:  Positive for activity change and fatigue. Negative for appetite change and fever.  HENT:  Positive for congestion, postnasal drip, sinus pressure and sore throat. Negative for sneezing.   Respiratory:  Negative for cough and shortness of breath.   Cardiovascular:  Negative for chest pain.  Gastrointestinal:  Negative for abdominal pain, diarrhea, nausea and vomiting.  Musculoskeletal:  Positive for arthralgias and myalgias.  Neurological:  Positive for headaches. Negative for dizziness and light-headedness.     Physical Exam Triage Vital Signs ED Triage Vitals  Enc Vitals Group     BP 02/10/22 1034 115/81     Pulse Rate 02/10/22 1034 85     Resp 02/10/22 1034 16     Temp 02/10/22 1034 98.2 F (36.8 C)     Temp Source 02/10/22 1034 Oral     SpO2 02/10/22 1034 100 %     Weight 02/10/22 1030 115 lb (52.2 kg)     Height 02/10/22 1030 5\' 6"  (1.676 m)     Head Circumference --      Peak Flow --      Pain Score 02/10/22 1029 7     Pain Loc --      Pain Edu? --      Excl. in GC? --    No data found.  Updated Vital Signs BP 115/81 (BP Location: Right Arm)   Pulse 85   Temp 98.2 F (36.8 C) (Oral)   Resp 16   Ht 5\' 6"  (1.676 m)   Wt 115 lb (52.2 kg)    LMP 02/06/2022 (Exact Date)   SpO2 100%   BMI 18.56 kg/m   Visual Acuity Right Eye Distance:   Left Eye Distance:   Bilateral Distance:    Right Eye Near:   Left Eye Near:    Bilateral Near:     Physical Exam Vitals reviewed.  Constitutional:      General: She is awake. She is not in acute distress.  Appearance: Normal appearance. She is well-developed. She is not ill-appearing.     Comments: Very pleasant female appear stated age in no acute distress sitting comfortably in exam room  HENT:     Head: Normocephalic and atraumatic.     Right Ear: Tympanic membrane, ear canal and external ear normal. Tympanic membrane is not erythematous or bulging.     Left Ear: Tympanic membrane, ear canal and external ear normal. Tympanic membrane is not erythematous or bulging.     Nose:     Right Sinus: Maxillary sinus tenderness and frontal sinus tenderness present.     Left Sinus: Maxillary sinus tenderness and frontal sinus tenderness present.     Mouth/Throat:     Pharynx: Uvula midline. Posterior oropharyngeal erythema present. No oropharyngeal exudate.     Comments: Erythema and drainage in posterior oropharynx Cardiovascular:     Rate and Rhythm: Normal rate and regular rhythm.     Heart sounds: Normal heart sounds, S1 normal and S2 normal. No murmur heard. Pulmonary:     Effort: Pulmonary effort is normal.     Breath sounds: Examination of the right-lower field reveals decreased breath sounds. Decreased breath sounds present. No wheezing, rhonchi or rales.     Comments: Decreased aeration right base without additional adventitious lung sounds Psychiatric:        Behavior: Behavior is cooperative.      UC Treatments / Results  Labs (all labs ordered are listed, but only abnormal results are displayed) Labs Reviewed  POCT URINE PREGNANCY    EKG   Radiology DG Chest 2 View  Result Date: 02/10/2022 CLINICAL DATA:  Decreased breath sounds on the right. Runny nose and  sneezing 1 week ago. Body aches, fatigue and facial pain. EXAM: CHEST - 2 VIEW COMPARISON:  None Available. FINDINGS: The heart size and mediastinal contours are within normal limits. Both lungs are clear. The visualized skeletal structures are unremarkable. IMPRESSION: No active cardiopulmonary disease. Electronically Signed   By: Signa Kell M.D.   On: 02/10/2022 11:18    Procedures Procedures (including critical care time)  Medications Ordered in UC Medications  albuterol (VENTOLIN HFA) 108 (90 Base) MCG/ACT inhaler 2 puff (2 puffs Inhalation Given 02/10/22 1110)    Initial Impression / Assessment and Plan / UC Course  I have reviewed the triage vital signs and the nursing notes.  Pertinent labs & imaging results that were available during my care of the patient were reviewed by me and considered in my medical decision making (see chart for details).     Patient is well-appearing, afebrile, nontoxic, nontachycardic.  Viral testing was deferred as she has been symptomatic for over a week and this would not change management.  X-ray was obtained given decreased aeration on exam which showed no acute cardiopulmonary disease.  She was given a few doses of albuterol with no improvement of symptoms but improvement of lung sounds on exam.  She was sent home with this medication to use this every 4-6 hours as needed.  Given over a week of symptoms with recent worsening recommended we start an antibiotic.  She was started on Omnicef twice daily for 10 days.  We discussed potential utility of a prednisone burst and she reports that she has this available from a previous prescription and will take 40 mg for the next 5 days.  Discussed that she is not to take NSAIDs with this medication.  Can use over-the-counter medication including Mucinex, Flonase.  She is to rest  and drink plenty of fluids.  Discussed that if she has any worsening symptoms she needs to be seen immediately.  Strict return precautions  given.  Work excuse note provided.  Final Clinical Impressions(s) / UC Diagnoses   Final diagnoses:  Acute non-recurrent pansinusitis     Discharge Instructions      Take cefdinir twice daily for 10 days.  Start prednisone burst from your previous prescription.  Do not take NSAIDs with this medication including aspirin, ibuprofen/Advil, naproxen/Aleve.  Use Mucinex and Flonase for additional symptom relief.  Rest and drink plenty of fluid.  If your symptoms are improving or if anything worsens please return for reevaluation.     ED Prescriptions     Medication Sig Dispense Auth. Provider   cefdinir (OMNICEF) 300 MG capsule Take 1 capsule (300 mg total) by mouth 2 (two) times daily. 20 capsule Jasmynn Pfalzgraf, Noberto Retort, PA-C      PDMP not reviewed this encounter.   Jeani Hawking, PA-C 02/10/22 [id number]

## 2022-02-11 ENCOUNTER — Telehealth: Payer: Self-pay | Admitting: Emergency Medicine

## 2022-02-11 NOTE — Telephone Encounter (Signed)
Call to [facility] to see how she was today. Feels slightly better, RN reviewed how to dose Albuterol - no instructions on AVS - 2 puffs every 6 hours prn wheezing or SOB - no other questions at this time

## 2022-02-17 DIAGNOSIS — F4389 Other reactions to severe stress: Secondary | ICD-10-CM | POA: Diagnosis not present

## 2022-02-17 DIAGNOSIS — F419 Anxiety disorder, unspecified: Secondary | ICD-10-CM | POA: Diagnosis not present

## 2022-02-19 ENCOUNTER — Ambulatory Visit: Admit: 2022-02-19 | Discharge status: Absent without leave | Payer: Federal, State, Local not specified - PPO

## 2022-02-19 ENCOUNTER — Other Ambulatory Visit: Payer: Self-pay

## 2022-02-19 ENCOUNTER — Encounter: Payer: Self-pay | Admitting: Emergency Medicine

## 2022-02-19 ENCOUNTER — Ambulatory Visit
Admission: EM | Admit: 2022-02-19 | Discharge: 2022-02-19 | Disposition: A | Discharge status: Home / Self Care | Payer: Federal, State, Local not specified - PPO | Attending: Family Medicine | Admitting: Family Medicine

## 2022-02-19 DIAGNOSIS — R6889 Other general symptoms and signs: Secondary | ICD-10-CM | POA: Diagnosis not present

## 2022-02-19 NOTE — ED Triage Notes (Signed)
Fatigue, otalgia, SOB, sinus pain and pressure, nausea, diarrhea yesterday, was here on 11/21, got a little better but started feeling bad again 3 days ago.

## 2022-02-19 NOTE — Discharge Instructions (Signed)
Complete the antibiotic therapy Use Flonase once or twice a day Use Allegra as directed Make sure you are drinking plenty of fluids Take Zofran if needed for nausea Expect slow improvement with time

## 2022-02-19 NOTE — ED Provider Notes (Signed)
[facility]    CSN: [id number] Arrival date & time: 02/19/22  1413      History   Chief Complaint Chief Complaint  Patient presents with   Fatigue    HPI Andrea Baker is a 31 y.o. female.   HPI Patient is having persistent symptoms after a respiratory virus.  She took 10 days of cefdinir.  Is taken prednisone.  Has been on antihistamines, decongestants and nasal sprays.  Still has a lot of mucus, ear pressure and pain, feels very tired.  Her chest is sore.  Had some nausea and diarrhea. Past Medical History:  Diagnosis Date   Acne    Anxiety and depression 05/02/2013   Intolerant to lexapro and effexor    Constipation    GERD 10/19/2009   Failed omeprazole and protonix.    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   Psoriasis 10/24/2018   GAD (generalized anxiety disorder) 10/13/2018   MDD (major depressive disorder), recurrent episode, moderate (HCC) 10/13/2018   Dyspepsia 10/13/2018   Insomnia 06/06/2018   Panic attacks 05/02/2018   Unintentional weight loss 05/02/2018   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   Grade I hemorrhoids 06/08/2017   Seborrheic dermatitis of scalp 08/11/2016   Chronic idiopathic constipation 06/12/2015   Eczema 05/17/2013   Anxiety and depression 05/02/2013   Allergic rhinitis 08/07/2011   Gastroesophageal reflux disease 10/19/2009    Past Surgical History:  Procedure Laterality Date   ESOPHAGEAL MANOMETRY N/A 09/12/2018   Procedure: ESOPHAGEAL MANOMETRY (EM);  Surgeon: Napoleon Form, MD;  Location: [facility];  Service: Endoscopy;  Laterality: N/A;   ESOPHAGOGASTRODUODENOSCOPY      x2 have been normal and gastric emptying has been normal    ESOPHAGOGASTRODUODENOSCOPY     gastric empyting study      PH IMPEDANCE STUDY N/A 09/12/2018   Procedure: PH IMPEDANCE STUDY;  Surgeon: Napoleon Form, MD;  Location: [facility];  Service: Endoscopy;  Laterality: N/A;    OB History   No obstetric history on file.      Home Medications    Prior to Admission medications   Medication Sig Start Date End Date Taking? Authorizing Provider  fexofenadine (ALLEGRA) 180 MG tablet Take 180 mg by mouth daily.   Yes [provider]  Alum Hydroxide-Mag Carbonate (GAVISCON PO) Take by mouth as needed.    [provider]  AMBULATORY NON FORMULARY MEDICATION FD Guard prn    [provider]  cefdinir (OMNICEF) 300 MG capsule Take 1 capsule (300 mg total) by mouth 2 (two) times daily. 02/10/22   Raspet, Erin K, PA-C  ondansetron (ZOFRAN-ODT) 4 MG disintegrating tablet DISSOLVE ONE tablet ON THE TONGUE EVERY 8 HOURS AS NEEDED FOR NAUSEA AND VOMITING    [provider]  Probiotic Product (PROBIOTIC DAILY PO) Take by mouth.    [provider]    Family History Family History  Problem Relation Age of Onset   Depression Mother    Heart attack Father    Diabetes Father        prediabetic   Hypertension Father    Hyperlipidemia  Father    Diabetes Paternal Grandmother    Colon cancer Neg Hx    Esophageal cancer Neg Hx    Rectal cancer Neg Hx    Stomach cancer Neg Hx     Social History Social History   Tobacco Use   Smoking status: Never   Smokeless tobacco: Never  Vaping Use   Vaping Use: Never used  Substance Use Topics   Alcohol use: Yes    Comment: occasionally   Drug use: No     Allergies   Augmentin [amoxicillin-pot clavulanate], Doxycycline, and Pristiq [desvenlafaxine]   Review of Systems Review of Systems See HPI  Physical Exam Triage Vital Signs ED Triage Vitals  Enc Vitals Group     BP 02/19/22 1425  105/70     Pulse Rate 02/19/22 1425 77     Resp 02/19/22 1425 16     Temp 02/19/22 1425 98.8 F (37.1 C)     Temp Source 02/19/22 1425 Oral     SpO2 02/19/22 1425 99 %     Weight 02/19/22 1426 115 lb (52.2 kg)     Height 02/19/22 1426 5\' 5"  (1.651 m)     Head Circumference --      Peak Flow --      Pain Score 02/19/22 1426 7     Pain Loc --      Pain Edu? --      Excl. in GC? --    No data found.  Updated Vital Signs BP 105/70 (BP Location: Left Arm)   Pulse 77   Temp 98.8 F (37.1 C) (Oral)   Resp 16   Ht 5\' 5"  (1.651 m)   Wt 52.2 kg   LMP 02/06/2022 (Exact Date)   SpO2 99%   BMI 19.14 kg/m      Physical Exam Constitutional:      General: She is not in acute distress.    Appearance: She is well-developed. She is ill-appearing.  HENT:     Head: Normocephalic and atraumatic.  Eyes:     Conjunctiva/sclera: Conjunctivae normal.     Pupils: Pupils are equal, round, and reactive to light.  Cardiovascular:     Rate and Rhythm: Normal rate.  Pulmonary:     Effort: Pulmonary effort is normal. No respiratory distress.  Abdominal:     General: There is no distension.     Palpations: Abdomen is soft.  Musculoskeletal:        General: Normal range of motion.     Cervical back: Normal range of motion.  Skin:    General: Skin is warm and dry.  Neurological:     Mental Status: She is alert.      UC Treatments / Results  Labs (all labs ordered are listed, but only abnormal results are displayed) Labs Reviewed - No data to display  EKG   Radiology No results found.  Procedures Procedures (including critical care time)  Medications Ordered in UC Medications - No data to display  Initial Impression / Assessment and Plan / UC Course  I have reviewed the triage vital signs and the nursing notes.  Pertinent labs & imaging results that were available during my care of the patient were reviewed by me and considered in my medical decision making (see chart for  details).     Explained to patient that this is the aftermath of her viral syndrome and will slowly go away Final Clinical Impressions(s) / UC Diagnoses   Final diagnoses:  Flu-like  symptoms     Discharge Instructions      Complete the antibiotic therapy Use Flonase once or twice a day Use Allegra as directed Make sure you are drinking plenty of fluids Take Zofran if needed for nausea Expect slow improvement with time   ED Prescriptions   None    PDMP not reviewed this encounter.   Eustace Moore, MD 02/19/22 [phone]

## 2022-03-03 DIAGNOSIS — F4389 Other reactions to severe stress: Secondary | ICD-10-CM | POA: Diagnosis not present

## 2022-03-03 DIAGNOSIS — F419 Anxiety disorder, unspecified: Secondary | ICD-10-CM | POA: Diagnosis not present

## 2022-03-25 DIAGNOSIS — F419 Anxiety disorder, unspecified: Secondary | ICD-10-CM | POA: Diagnosis not present

## 2022-03-25 DIAGNOSIS — F4389 Other reactions to severe stress: Secondary | ICD-10-CM | POA: Diagnosis not present

## 2022-04-08 DIAGNOSIS — F419 Anxiety disorder, unspecified: Secondary | ICD-10-CM | POA: Diagnosis not present

## 2022-04-08 DIAGNOSIS — F4389 Other reactions to severe stress: Secondary | ICD-10-CM | POA: Diagnosis not present

## 2022-04-21 DIAGNOSIS — F4389 Other reactions to severe stress: Secondary | ICD-10-CM | POA: Diagnosis not present

## 2022-04-21 DIAGNOSIS — F419 Anxiety disorder, unspecified: Secondary | ICD-10-CM | POA: Diagnosis not present

## 2022-05-06 ENCOUNTER — Encounter: Payer: Self-pay | Admitting: Physician Assistant

## 2022-05-06 ENCOUNTER — Ambulatory Visit: Payer: Federal, State, Local not specified - PPO | Admitting: Physician Assistant

## 2022-05-06 VITALS — BP 112/71 | HR 70 | Ht 65.0 in | Wt 120.0 lb

## 2022-05-06 DIAGNOSIS — L409 Psoriasis, unspecified: Secondary | ICD-10-CM | POA: Diagnosis not present

## 2022-05-06 DIAGNOSIS — R110 Nausea: Secondary | ICD-10-CM

## 2022-05-06 DIAGNOSIS — Z1589 Genetic susceptibility to other disease: Secondary | ICD-10-CM

## 2022-05-06 DIAGNOSIS — E538 Deficiency of other specified B group vitamins: Secondary | ICD-10-CM

## 2022-05-06 DIAGNOSIS — H6993 Unspecified Eustachian tube disorder, bilateral: Secondary | ICD-10-CM

## 2022-05-06 MED ORDER — ONDANSETRON 4 MG PO TBDP: ORAL_TABLET | ORAL | 5 refills | Status: AC

## 2022-05-06 MED ORDER — METHYLPREDNISOLONE 4 MG PO TBPK: ORAL_TABLET | ORAL | 0 refills | Status: DC

## 2022-05-06 MED ORDER — HALOBETASOL PROPIONATE 0.05 % EX FOAM: 1.0000 | Freq: Every day | CUTANEOUS | 5 refills | Status: AC

## 2022-05-06 NOTE — Patient Instructions (Addendum)
Eustachian Tube Dysfunction  Eustachian tube dysfunction refers to a condition in which a blockage develops in the narrow passage that connects the middle ear to the back of the nose (eustachian tube). The eustachian tube regulates air pressure in the middle ear by letting air move between the ear and nose. It also helps to drain fluid from the middle ear space. Eustachian tube dysfunction can affect one or both ears. When the eustachian tube does not function properly, air pressure, fluid, or both can build up in the middle ear. What are the causes? This condition occurs when the eustachian tube becomes blocked or cannot open normally. Common causes of this condition include: Ear infections. Colds and other infections that affect the nose, mouth, and throat (upper respiratory tract). Allergies. Irritation from cigarette smoke. Irritation from stomach acid coming up into the esophagus (gastroesophageal reflux). The esophagus is the part of the body that moves food from the mouth to the stomach. Sudden changes in air pressure, such as from descending in an airplane or scuba diving. Abnormal growths in the nose or throat, such as: Growths that line the nose (nasal polyps). Abnormal growth of cells (tumors). Enlarged tissue at the back of the throat (adenoids). What increases the risk? You are more likely to develop this condition if: You smoke. You are overweight. You are a child who has: Certain birth defects of the mouth, such as cleft palate. Large tonsils or adenoids. What are the signs or symptoms? Common symptoms of this condition include: A feeling of fullness in the ear. Ear pain. Clicking or popping noises in the ear. Ringing in the ear (tinnitus). Hearing loss. Loss of balance. Dizziness. Symptoms may get worse when the air pressure around you changes, such as when you travel to an area of high elevation, fly on an airplane, or go scuba diving. How is this diagnosed? This  condition may be diagnosed based on: Your symptoms. A physical exam of your ears, nose, and throat. Tests, such as those that measure: The movement of your eardrum. Your hearing (audiometry). How is this treated? Treatment depends on the cause and severity of your condition. In mild cases, you may relieve your symptoms by moving air into your ears. This is called "popping the ears." In more severe cases, or if you have symptoms of fluid in your ears, treatment may include: Medicines to relieve congestion (decongestants). Medicines that treat allergies (antihistamines). Nasal sprays or ear drops that contain medicines that reduce swelling (steroids). A procedure to drain the fluid in your eardrum. In this procedure, a small tube may be placed in the eardrum to: Drain the fluid. Restore the air in the middle ear space. A procedure to insert a balloon device through the nose to inflate the opening of the eustachian tube (balloon dilation). Follow these instructions at home: Lifestyle Do not do any of the following until your health care provider approves: Travel to high altitudes. Fly in airplanes. Work in a pressurized cabin or room. Scuba dive. Do not use any products that contain nicotine or tobacco. These products include cigarettes, chewing tobacco, and vaping devices, such as e-cigarettes. If you need help quitting, ask your health care provider. Keep your ears dry. Wear fitted earplugs during showering and bathing. Dry your ears completely after. General instructions Take over-the-counter and prescription medicines only as told by your health care provider. Use techniques to help pop your ears as recommended by your health care provider. These may include: Chewing gum. Yawning. Frequent, forceful swallowing.   Closing your mouth, holding your nose closed, and gently blowing as if you are trying to blow air out of your nose. Keep all follow-up visits. This is important. Contact a  health care provider if: Your symptoms do not go away after treatment. Your symptoms come back after treatment. You are unable to pop your ears. You have: A fever. Pain in your ear. Pain in your head or neck. Fluid draining from your ear. Your hearing suddenly changes. You become very dizzy. You lose your balance. Get help right away if: You have a sudden, severe increase in any of your symptoms. Summary Eustachian tube dysfunction refers to a condition in which a blockage develops in the eustachian tube. It can be caused by ear infections, allergies, inhaled irritants, or abnormal growths in the nose or throat. Symptoms may include ear pain or fullness, hearing loss, or ringing in the ears. Mild cases are treated with techniques to unblock the ears, such as yawning or chewing gum. More severe cases are treated with medicines or procedures. This information is not intended to replace advice given to you by your health care provider. Make sure you discuss any questions you have with your health care provider. Document Revised: 05/20/2020 Document Reviewed: 05/20/2020 Elsevier Patient Education  [place].   Plantar Warts Warts are small growths on the skin. When they occur on the underside (sole) of the foot, they are called plantar warts. Plantar warts often occur in groups, with several small warts around a larger wart. They tend to develop on the heel or the ball of the foot. They may grow into the deeper layers of skin or rise above the surface of the skin. Most warts are not painful, and they usually do not cause problems. However, plantar warts may cause pain when you walk because pressure is applied to them. Plantar warts may spread to other areas of the sole. They can also spread to other areas of the body through direct and indirect contact. Warts often go away on their own in time. Various treatments may be done if needed or desired. What are the causes? Plantar warts  are caused by a type of virus that is called human papillomavirus (HPV). Walking barefoot can cause exposure to the virus, especially if your feet are wet. HPV attacks a break in the skin of the foot. What increases the risk? You are more likely to develop this condition if you: Are between 40 and 60 years of age. Use public showers or locker rooms. Have a weakened body defense system (immune system). What are the signs or symptoms? Common symptoms of this condition include: Flat or slightly raised growths that have a rough surface and look similar to a callus. Pain when you use your foot to support your body weight. How is this diagnosed? A plantar wart can usually be diagnosed from its appearance. In some cases, a tissue sample may be removed (biopsy) to be looked at under a microscope. How is this treated? In many cases, warts do not need treatment. Without treatment, they often go away with time. If treatment is needed or desired, options may include: Applying medicated solutions, creams, or patches to the wart. These may be over-the-counter or prescription medicines that make the skin soft so that layers will gradually shed away. In many cases, the medicine is applied one or two times a day and covered with a bandage. Freezing the wart with liquid nitrogen (cryotherapy). Burning the wart with: Laser treatment. An electrified  probe (electrocautery). Injecting a medicine (Candida antigen) into the wart to help the body's immune system fight off the wart. Having surgery to remove the wart. Putting duct tape over the top of the wart (occlusion). You will leave the tape in place for as long as told by your health care provider, and then you will replace it with a new strip of tape. This is done until the wart goes away. Repeat treatment may be needed if you choose to remove warts. Warts sometimes go away and come back again. Follow these instructions at home: Apply medicated creams or  solutions only as told by your health care provider. This may involve: Soaking the affected area in warm water. Removing the top layer of softened skin before you apply the medicine. A pumice stone works well for removing the skin. Applying a bandage over the affected area after you apply the medicine. Repeating the process daily or as told by your health care provider. Do not scratch or pick at a wart. Wash your hands after you touch a wart. If a wart is painful, try covering it with a bandage that has a hole in the middle. This helps to take pressure off the wart. Keep all follow-up visits as told by your health care provider. This is important. How is this prevented? Take these actions to help prevent warts: Wear shoes and socks. Change your socks daily. Keep your feet clean and dry. Do not walk barefoot in shared locker rooms, shower areas, or swimming pools. Check your feet regularly. Avoid direct contact with warts on other people. Contact a health care provider if: Your warts do not improve after treatment. You have redness, swelling, or pain at the site of a wart. You have bleeding from a wart that does not stop with light pressure. You have diabetes and you develop a wart. Summary Warts are small growths on the skin. When they occur on the underside (sole) of the foot, they are called plantar warts. In many cases, warts do not need treatment. Without treatment, they often go away with time. Apply medicated creams or solutions only as told by your health care provider. Do not scratch or pick at a wart. Wash your hands after you touch a wart. Keep all follow-up visits as told by your health care provider. This is important. This information is not intended to replace advice given to you by your health care provider. Make sure you discuss any questions you have with your health care provider. Document Revised: 06/27/2020 Document Reviewed: 06/27/2020 Elsevier Patient Education   [place].

## 2022-05-06 NOTE — Progress Notes (Signed)
Acute Office Visit  Subjective:     Patient ID: Andrea Baker, female    DOB: Oct 08, 1990, 32 y.o.   MRN: [id number]  Chief Complaint  Patient presents with   wart on foot    HPI 32 year old female who presents with CC of wart on right foot. Pt states she has had the wart for about 3 years now but it has progressively gotten worse within the last 6 months. She states she now has discomfort with walking. She does a lot of hiking/backpacking and would like to discuss treatment options due to upcoming hike in [place]. She states that for the past 2-3 months she has had the feeling of fluid behind her ears. She states she frequently feels as if her ears are popping. She denies cough, congestion, fever.  She also has a PMH for psoriasis and is asking for a refill on her Lexette.  She has a long standing history of GI disturbances, nasuea, and vomiting and is asking about a refill for her zofran.  She is in a nutrition program and is asking for routine labs to be drawn as well as different vitamin and micronutrient levels due to her MTHFR gene mutation, psoriasis and history of B12 deficiency.  Review of Systems  Constitutional:  Negative for chills, fever and weight loss.  HENT:  Positive for congestion and ear pain. Negative for sinus pain and sore throat.   Eyes: Negative.   Respiratory:  Negative for cough, sputum production and shortness of breath.   Cardiovascular:  Negative for chest pain and palpitations.  Gastrointestinal:  Negative for constipation, diarrhea and nausea.  Musculoskeletal: Negative.   Skin:  Positive for itching and rash.  Neurological: Negative.   Psychiatric/Behavioral: Negative.          Objective:    BP 112/71 (BP Location: Left Arm, Patient Position: Sitting, Cuff Size: Small)   Pulse 70   Ht 5' 5"$  (1.651 m)   Wt 120 lb (54.4 kg)   SpO2 98%   BMI 19.97 kg/m  BP Readings from Last 3 Encounters:  05/06/22 112/71  02/19/22 105/70  02/10/22 115/81    Wt Readings from Last 3 Encounters:  05/06/22 120 lb (54.4 kg)  02/19/22 115 lb (52.2 kg)  02/10/22 115 lb (52.2 kg)    ..    05/06/2022    3:29 PM 07/01/2021   10:13 AM 01/23/2021    2:15 PM 11/26/2020    9:31 AM 09/17/2020   10:59 AM  Depression screen PHQ 2/9  Decreased Interest 0      Down, Depressed, Hopeless 0      PHQ - 2 Score 0      Altered sleeping       Tired, decreased energy       Change in appetite       Feeling bad or failure about yourself        Trouble concentrating       Moving slowly or fidgety/restless       Suicidal thoughts       PHQ-9 Score       Difficult doing work/chores          Information is confidential and restricted. Go to Review Flowsheets to unlock data.   ..    02/15/2019    7:06 AM 10/11/2018    2:47 PM 06/03/2018    4:11 PM 04/29/2018   10:00 AM  GAD 7 : Generalized Anxiety Score  Nervous, Anxious, on Edge  $3 3 2 3  Y$ Control/stop worrying 3 3 2 3  $ Worry too much - different things 3 3 3 3  $ Trouble relaxing 3 3 2 2  $ Restless 1 3 0 0  Easily annoyed or irritable 3 1 3 2  $ Afraid - awful might happen 3 2 3 3  $ Total GAD 7 Score 19 18 15 16  $ Anxiety Difficulty Somewhat difficult Very difficult Very difficult Very difficult      Physical Exam Constitutional:      Appearance: Normal appearance. She is normal weight.  HENT:     Head: Normocephalic and atraumatic.     Right Ear: Ear canal normal.     Left Ear: Tympanic membrane and ear canal normal.     Ears:     Comments: Noticeable fluid level behind Right Tympanic Membrane    Nose: Nose normal. No congestion.     Mouth/Throat:     Mouth: Mucous membranes are moist.     Pharynx: Oropharynx is clear.  Eyes:     Extraocular Movements: Extraocular movements intact.  Pulmonary:     Effort: Pulmonary effort is normal.  Musculoskeletal:        General: Normal range of motion.     Cervical back: Normal range of motion and neck supple.  Skin:    Findings: Rash present.      Comments: Plantar wart on right foot. Located over fifth metatarsal head on plantar surface. Psoriasis on extensor surface of forearms and scalp  Neurological:     Mental Status: She is alert and oriented to person, place, and time.  Psychiatric:        Mood and Affect: Mood normal.        Behavior: Behavior normal.    Cryotherapy Procedure Note  Pre-operative Diagnosis: plantar wart of right lateral foot  Post-operative Diagnosis: same  Locations: right lateral foot over metatarsals  Indications: pain  Procedure Details  History of allergy to iodine: no. Pacemaker? no.  Patient informed of risks (permanent scarring, infection, light or dark discoloration, bleeding, infection, weakness, numbness and recurrence of the lesion) and benefits of the procedure and verbal informed consent obtained.  The areas are treated with liquid nitrogen therapy, frozen until ice ball extended 2 mm beyond lesion, allowed to thaw, and treated again. The patient tolerated procedure well.  The patient was instructed on post-op care, warned that there may be blister formation, redness and pain. Recommend OTC analgesia as needed for pain.  Condition: Stable  Complications: none.  Plan: 1. Instructed to keep the area dry and covered for 24-48h and clean thereafter. 2. Warning signs of infection were reviewed.   3. Recommended that the patient use OTC acetaminophen as needed for pain.         Assessment & Plan:   Andrea Baker was seen today for wart on foot.  Diagnoses and all orders for this visit:  B12 deficiency -     Cancel: Ceruloplasmin -     Cancel: CBC with Differential/Platelet -     Comprehensive metabolic panel -     Cancel: Copper, serum -     Cancel: Gamma GT -     Cancel: Hemoglobin A1c -     Cancel: Homocysteine -     Cancel: Fe+TIBC+Fer -     Lipid Panel With LDL/HDL Ratio -     Cancel: Magnesium -     Cancel: Methylmalonic acid(mma), rnd urine -     Transferrin  Saturation -  Cancel: Vitamin A -     Cancel: Vitamin B12 -     Cancel: Folate -     Cancel: VITAMIN D 25 Hydroxy (Vit-D Deficiency, Fractures) -     CBC with Differential/Platelet -     Ceruloplasmin -     CMP14+EGFR -     Copper, serum -     Gamma GT -     Hemoglobin A1c -     Homocysteine -     Fe+TIBC+Fer -     Magnesium -     Methylmalonic acid(mma), rnd urine -     Vitamin A -     Vitamin B12 -     Folate -     VITAMIN D 25 Hydroxy (Vit-D Deficiency, Fractures)  MTHFR gene mutation -     Cancel: Ceruloplasmin -     Cancel: CBC with Differential/Platelet -     Comprehensive metabolic panel -     Cancel: Copper, serum -     Cancel: Gamma GT -     Cancel: Hemoglobin A1c -     Cancel: Homocysteine -     Cancel: Fe+TIBC+Fer -     Lipid Panel With LDL/HDL Ratio -     Cancel: Magnesium -     Cancel: Methylmalonic acid(mma), rnd urine -     Transferrin Saturation -     Cancel: Vitamin A -     Cancel: Vitamin B12 -     Cancel: Folate -     Cancel: VITAMIN D 25 Hydroxy (Vit-D Deficiency, Fractures) -     Estradiol -     DHEA-sulfate -     DHEA -     FSH/LH -     Insulin, random -     Insulin-like growth factor -     Progesterone -     Histamine Determination, Blood -     Testosterone Free with SHBG -     CBC with Differential/Platelet -     Ceruloplasmin -     CMP14+EGFR -     Copper, serum -     Gamma GT -     Hemoglobin A1c -     Homocysteine -     Fe+TIBC+Fer -     Magnesium -     Methylmalonic acid(mma), rnd urine -     Vitamin A -     Vitamin B12 -     Folate -     VITAMIN D 25 Hydroxy (Vit-D Deficiency, Fractures)  Psoriasis -     Halobetasol Propionate (LEXETTE) 0.05 % FOAM; Apply 1 Application topically daily. Apply topically. -     Cancel: Ceruloplasmin -     Cancel: CBC with Differential/Platelet -     Comprehensive metabolic panel -     Cancel: Copper, serum -     Cancel: Gamma GT -     Cancel: Hemoglobin A1c -     Cancel: Homocysteine -      Cancel: Fe+TIBC+Fer -     Lipid Panel With LDL/HDL Ratio -     Cancel: Magnesium -     Cancel: Methylmalonic acid(mma), rnd urine -     Transferrin Saturation -     Cancel: Vitamin A -     Cancel: Vitamin B12 -     Cancel: Folate -     Cancel: VITAMIN D 25 Hydroxy (Vit-D Deficiency, Fractures) -     Estradiol -     DHEA-sulfate -     DHEA -  FSH/LH -     Insulin, random -     Insulin-like growth factor -     Progesterone -     Histamine Determination, Blood -     Testosterone Free with SHBG -     CBC with Differential/Platelet -     Ceruloplasmin -     CMP14+EGFR -     Copper, serum -     Gamma GT -     Hemoglobin A1c -     Homocysteine -     Fe+TIBC+Fer -     Magnesium -     Methylmalonic acid(mma), rnd urine -     Vitamin A -     Vitamin B12 -     Folate -     VITAMIN D 25 Hydroxy (Vit-D Deficiency, Fractures)  Nausea -     ondansetron (ZOFRAN-ODT) 4 MG disintegrating tablet; DISSOLVE ONE tablet ON THE TONGUE EVERY 8 HOURS AS NEEDED FOR NAUSEA AND VOMITING  Dysfunction of both eustachian tubes -     methylPREDNISolone (MEDROL DOSEPAK) 4 MG TBPK tablet; Take as directed by package insert.   Cryotherapy performed in office for plantar wart. Pt tolerated procedure with no complications. Discussed potential need for repeat treatment Discussed use of salicylic acid and taping as well as pumice stone for home treatment of wart Start Medrol dosepak to help reduce fluid and inflammation of eustachian tubes Educated pt that hiking at different altitudes contributes to dysfunction. Recommended chewing gum Refilled Lexette for scalp psoriasis Refilled zofran for long history of nausea and vomiting Ordered attached labs above due to pt request. 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

## 2022-05-07 ENCOUNTER — Telehealth: Payer: Self-pay | Admitting: Neurology

## 2022-05-07 NOTE — Telephone Encounter (Signed)
[facility] called and LVM stating they are not able to get Halobetasol Propionate (LEXETTE) 0.05 % FOAM  but can get the cream version. Did you want to prescribe cream or change to something else?

## 2022-05-08 ENCOUNTER — Encounter: Payer: Self-pay | Admitting: Physician Assistant

## 2022-05-08 MED ORDER — CLOBETASOL PROPIONATE 0.05 % EX FOAM: Freq: Two times a day (BID) | CUTANEOUS | 1 refills | Status: AC

## 2022-05-08 NOTE — Telephone Encounter (Signed)
I sent olux foam to try.

## 2022-05-11 DIAGNOSIS — E538 Deficiency of other specified B group vitamins: Secondary | ICD-10-CM | POA: Diagnosis not present

## 2022-05-11 DIAGNOSIS — L409 Psoriasis, unspecified: Secondary | ICD-10-CM | POA: Diagnosis not present

## 2022-05-11 DIAGNOSIS — Z1589 Genetic susceptibility to other disease: Secondary | ICD-10-CM | POA: Diagnosis not present

## 2022-05-18 NOTE — Progress Notes (Signed)
Vitamin d 1000-2000 units daily.

## 2022-05-18 NOTE — Progress Notes (Signed)
Devetta,   Magnesium looks good.  B12 looks great.  A1C normal. No signs of diabetes.  Iron levels and iron stores look good.  Vitamin D still low. How much are you taking?  Albumin(protein) a little high, consider cutting back a little in protein in diet.  Cholesterol looks great.

## 2022-05-19 LAB — CMP14+EGFR
ALT: 28 IU/L (ref 0–32)
AST: 22 IU/L (ref 0–40)
Albumin/Globulin Ratio: 2 (ref 1.2–2.2)
Albumin: 5.3 g/dL — ABNORMAL HIGH (ref 3.9–4.9)
Alkaline Phosphatase: 58 IU/L (ref 44–121)
BUN/Creatinine Ratio: 11 (ref 9–23)
BUN: 8 mg/dL (ref 6–20)
Bilirubin Total: 0.6 mg/dL (ref 0.0–1.2)
CO2: 19 mmol/L — ABNORMAL LOW (ref 20–29)
Calcium: 10.2 mg/dL (ref 8.7–10.2)
Chloride: 103 mmol/L (ref 96–106)
Creatinine, Ser: 0.73 mg/dL (ref 0.57–1.00)
Globulin, Total: 2.7 g/dL (ref 1.5–4.5)
Glucose: 101 mg/dL — ABNORMAL HIGH (ref 70–99)
Potassium: 4.8 mmol/L (ref 3.5–5.2)
Sodium: 140 mmol/L (ref 134–144)
Total Protein: 8 g/dL (ref 6.0–8.5)
eGFR: 113 mL/min/{1.73_m2} (ref >59)

## 2022-05-19 LAB — CBC WITH DIFFERENTIAL/PLATELET
Basophils Absolute: 0 10*3/uL (ref 0.0–0.2)
Basos: 1 %
EOS (ABSOLUTE): 0.1 10*3/uL (ref 0.0–0.4)
Eos: 2 %
Hematocrit: 44.6 % (ref 34.0–46.6)
Hemoglobin: 15.2 g/dL (ref 11.1–15.9)
Immature Grans (Abs): 0 10*3/uL (ref 0.0–0.1)
Immature Granulocytes: 0 %
Lymphocytes Absolute: 1.6 10*3/uL (ref 0.7–3.1)
Lymphs: 31 %
MCH: 32.4 pg (ref 26.6–33.0)
MCHC: 34.1 g/dL (ref 31.5–35.7)
MCV: 95 fL (ref 79–97)
Monocytes Absolute: 0.4 10*3/uL (ref 0.1–0.9)
Monocytes: 8 %
Neutrophils Absolute: 3 10*3/uL (ref 1.4–7.0)
Neutrophils: 58 %
Platelets: 287 10*3/uL (ref 150–450)
RBC: 4.69 x10E6/uL (ref 3.77–5.28)
RDW: 11.5 % — ABNORMAL LOW (ref 11.7–15.4)
WBC: 5.1 10*3/uL (ref 3.4–10.8)

## 2022-05-19 LAB — TRANSFERRIN SATURATION
IRON SATN MFR SERPL: 36 % Saturation
IRON SERPL-MCNC: 137 ug/dL
TRANSFERRIN SERPL-MCNC: 272 mg/dL

## 2022-05-19 LAB — HEMOGLOBIN A1C
Est. average glucose Bld gHb Est-mCnc: 97 mg/dL
Hgb A1c MFr Bld: 5 % (ref 4.8–5.6)

## 2022-05-19 LAB — MAGNESIUM: Magnesium: 2.1 mg/dL (ref 1.6–2.3)

## 2022-05-19 LAB — HOMOCYSTEINE: Homocysteine: 7.3 umol/L (ref 0.0–14.5)

## 2022-05-19 LAB — VITAMIN B12: Vitamin B-12: 1097 pg/mL (ref 232–1245)

## 2022-05-19 LAB — IRON,TIBC AND FERRITIN PANEL
Ferritin: 100 ng/mL (ref 15–150)
Iron Saturation: 41 % (ref 15–55)
Iron: 140 ug/dL (ref 27–159)
Total Iron Binding Capacity: 341 ug/dL (ref 250–450)
UIBC: 201 ug/dL (ref 131–425)

## 2022-05-19 LAB — FOLATE: Folate: >20 ng/mL (ref >3.0)

## 2022-05-19 LAB — VITAMIN D 25 HYDROXY (VIT D DEFICIENCY, FRACTURES): Vit D, 25-Hydroxy: 27.3 ng/mL — ABNORMAL LOW (ref 30.0–100.0)

## 2022-05-19 LAB — METHYLMALONIC ACID(MMA), RND URINE
Creatinine(Crt), U: 0.13 g/L — ABNORMAL LOW (ref 0.30–3.00)
MMA - Normalized: 1.5 umol/mmol cr (ref 0.5–3.4)
Methylmalonic Acid, Ur: 1.7 umol/L (ref 1.6–29.7)

## 2022-05-19 LAB — LIPID PANEL WITH LDL/HDL RATIO
Cholesterol, Total: 189 mg/dL (ref 100–199)
HDL: 69 mg/dL (ref >39)
LDL Chol Calc (NIH): 107 mg/dL — ABNORMAL HIGH (ref 0–99)
LDL/HDL Ratio: 1.6 ratio (ref 0.0–3.2)
Triglycerides: 71 mg/dL (ref 0–149)
VLDL Cholesterol Cal: 13 mg/dL (ref 5–40)

## 2022-05-19 LAB — COPPER, SERUM: Copper: 105 ug/dL (ref 80–158)

## 2022-05-19 LAB — GAMMA GT: GGT: 20 IU/L (ref 0–60)

## 2022-05-19 LAB — VITAMIN A: Vitamin A: 43.4 ug/dL (ref 18.9–57.3)

## 2022-05-19 LAB — CERULOPLASMIN: Ceruloplasmin: 21 mg/dL (ref 19.0–39.0)

## 2022-05-21 DIAGNOSIS — F4389 Other reactions to severe stress: Secondary | ICD-10-CM | POA: Diagnosis not present

## 2022-05-21 DIAGNOSIS — F419 Anxiety disorder, unspecified: Secondary | ICD-10-CM | POA: Diagnosis not present

## 2022-05-25 DIAGNOSIS — L409 Psoriasis, unspecified: Secondary | ICD-10-CM | POA: Diagnosis not present

## 2022-05-25 DIAGNOSIS — Z1589 Genetic susceptibility to other disease: Secondary | ICD-10-CM | POA: Diagnosis not present

## 2022-05-28 ENCOUNTER — Encounter: Payer: Self-pay | Admitting: Physician Assistant

## 2022-05-28 DIAGNOSIS — H9209 Otalgia, unspecified ear: Secondary | ICD-10-CM

## 2022-05-28 DIAGNOSIS — H9203 Otalgia, bilateral: Secondary | ICD-10-CM

## 2022-05-29 ENCOUNTER — Ambulatory Visit
Admission: RE | Admit: 2022-05-29 | Discharge: 2022-05-29 | Disposition: A | Discharge status: Home / Self Care | Payer: Federal, State, Local not specified - PPO | Source: Ambulatory Visit | Attending: Urgent Care | Admitting: Urgent Care

## 2022-05-29 VITALS — BP 108/76 | HR 76 | Temp 98.4°F | Resp 18 | Ht 65.0 in | Wt 117.0 lb

## 2022-05-29 DIAGNOSIS — J310 Chronic rhinitis: Secondary | ICD-10-CM

## 2022-05-29 DIAGNOSIS — J329 Chronic sinusitis, unspecified: Secondary | ICD-10-CM | POA: Diagnosis not present

## 2022-05-29 DIAGNOSIS — J309 Allergic rhinitis, unspecified: Secondary | ICD-10-CM

## 2022-05-29 MED ORDER — AMOXICILLIN 875 MG PO TABS: 875.0000 mg | ORAL_TABLET | Freq: Two times a day (BID) | ORAL | 0 refills | Status: DC

## 2022-05-29 MED ORDER — PSEUDOEPHEDRINE HCL 60 MG PO TABS: 60.0000 mg | ORAL_TABLET | Freq: Three times a day (TID) | ORAL | 0 refills | Status: AC | PRN

## 2022-05-29 NOTE — ED Triage Notes (Signed)
Patient c/o sore throat, headache since yesterday.  Bilateral ear pain, left is worse.  Feeling fatigue and bodyaches.  Patient has taken Celoron for sx's.

## 2022-05-29 NOTE — Discharge Instructions (Signed)
Please follow up with your PCP to see if they can refer you to an ear, nose, throat specialist for a consultation on your persistent symptoms, recurrent sinus infections and ear pains.   For this episode, let's use amoxicillin for a recurrent sinus infection. Maintain Allegra daily. Do not use nasal sprays. Use pseudoephedrine through the weekend.

## 2022-05-29 NOTE — ED Provider Notes (Signed)
[facility]  Note:  This document was prepared using Dragon voice recognition software and may include unintentional dictation errors.  MRN: [id number] DOB: 1990-07-31  Subjective:   Andrea Baker is a 32 y.o. female presenting for 1 day history of recurrent throat pain, sinus headache, fatigue, malaise, bilateral ear pain worse in the left side.  Patient underwent a course of amoxicillin from her visit in August 2023 for recurrent sinus infection.  From her November visit, she was prescribed cefdinir and steroids.  Takes Allegra consistently.  She did reach out to her PCP, states that she completed a course of prednisone 2 weeks ago.  No antibiotics have been used since November.  Has not seen an ENT specialist.  No current facility-administered medications for this encounter.  Current Outpatient Medications:    Alum Hydroxide-Mag Carbonate (GAVISCON PO), Take by mouth as needed., Disp: , Rfl:    AMBULATORY NON FORMULARY MEDICATION, FD Guard prn, Disp: , Rfl:    clobetasol (OLUX) 0.05 % topical foam, Apply topically 2 (two) times daily., Disp: 50 g, Rfl: 1   fexofenadine (ALLEGRA) 180 MG tablet, Take 180 mg by mouth daily., Disp: , Rfl:    Halobetasol Propionate (LEXETTE) 0.05 % FOAM, Apply 1 Application topically daily. Apply topically., Disp: 50 g, Rfl: 5   methylPREDNISolone (MEDROL DOSEPAK) 4 MG TBPK tablet, Take as directed by package insert., Disp: 21 tablet, Rfl: 0   ondansetron (ZOFRAN-ODT) 4 MG disintegrating tablet, DISSOLVE ONE tablet ON THE TONGUE EVERY 8 HOURS AS NEEDED FOR NAUSEA AND VOMITING, Disp: 20 tablet, Rfl: 5   Probiotic Product (PROBIOTIC DAILY PO), Take by mouth., Disp: , Rfl:    Allergies  Allergen Reactions   Augmentin [Amoxicillin-Pot Clavulanate]     Vomiting when taken 10 years ago. Has taken amoxicillin without problems.   Doxycycline Nausea And Vomiting    Nausea and vomiting    Pristiq [Desvenlafaxine]     Nausea/vomiting/headache     Past Medical History:  Diagnosis Date   Acne    Anxiety and depression 05/02/2013   Intolerant to lexapro and effexor    Constipation    GERD 10/19/2009   Failed omeprazole and protonix.    GERD (gastroesophageal reflux disease)    Mouth ulcer    Psoriasis    of the scalp   Wears contact lenses    Wears glasses      Past Surgical History:  Procedure Laterality Date   ESOPHAGEAL MANOMETRY N/A 09/12/2018   Procedure: ESOPHAGEAL MANOMETRY (EM);  Surgeon: Mauri Pole, MD;  Location: [facility];  Service: Endoscopy;  Laterality: N/A;   ESOPHAGOGASTRODUODENOSCOPY     x2 have been normal and gastric emptying has been normal    ESOPHAGOGASTRODUODENOSCOPY     gastric empyting study      [place] IMPEDANCE STUDY N/A 09/12/2018   Procedure: [place] IMPEDANCE STUDY;  Surgeon: Mauri Pole, MD;  Location: [facility];  Service: Endoscopy;  Laterality: N/A;    Family History  Problem Relation Age of Onset   Depression Mother    Heart attack Father    Diabetes Father        prediabetic   Hypertension Father    Hyperlipidemia Father    Diabetes Paternal Grandmother    Colon cancer Neg Hx    Esophageal cancer Neg Hx    Rectal cancer Neg Hx    Stomach cancer Neg Hx     Social History   Tobacco Use   Smoking status: Never  Smokeless tobacco: Never  Vaping Use   Vaping Use: Never used  Substance Use Topics   Alcohol use: Yes    Comment: occasionally   Drug use: No    ROS   Objective:   Vitals: BP 108/76 (BP Location: Right Arm)   Pulse 76   Temp 98.4 F (36.9 C) (Oral)   Resp 18   Ht '5\' 5"'$  (1.651 m)   Wt 117 lb (53.1 kg)   SpO2 98%   BMI 19.47 kg/m   Physical Exam Constitutional:      General: She is not in acute distress.    Appearance: Normal appearance. She is well-developed and normal weight. She is not ill-appearing, toxic-appearing or diaphoretic.  HENT:     Head: Normocephalic and atraumatic.     Right Ear: Tympanic membrane, ear canal and  external ear normal. No drainage or tenderness. No middle ear effusion. There is no impacted cerumen. Tympanic membrane is not erythematous or bulging.     Left Ear: Tympanic membrane, ear canal and external ear normal. No drainage or tenderness.  No middle ear effusion. There is no impacted cerumen. Tympanic membrane is not erythematous or bulging.     Nose: No congestion or rhinorrhea.     Comments: Nasal mucosa boggy and edematous.    Mouth/Throat:     Mouth: Mucous membranes are moist. No oral lesions.     Pharynx: Posterior oropharyngeal erythema (mild without exudate) present. No pharyngeal swelling, oropharyngeal exudate or uvula swelling.     Tonsils: No tonsillar exudate or tonsillar abscesses.  Eyes:     General: No scleral icterus.       Right eye: No discharge.        Left eye: No discharge.     Extraocular Movements: Extraocular movements intact.     Right eye: Normal extraocular motion.     Left eye: Normal extraocular motion.     Conjunctiva/sclera: Conjunctivae normal.  Cardiovascular:     Rate and Rhythm: Normal rate.  Pulmonary:     Effort: Pulmonary effort is normal.  Musculoskeletal:     Cervical back: Normal range of motion and neck supple.  Lymphadenopathy:     Cervical: No cervical adenopathy.  Skin:    General: Skin is warm and dry.  Neurological:     General: No focal deficit present.     Mental Status: She is alert and oriented to person, place, and time.  Psychiatric:        Mood and Affect: Mood normal.        Behavior: Behavior normal.     Assessment and Plan :   PDMP not reviewed this encounter.  1. Recurrent sinusitis   2. Allergic rhinitis, unspecified seasonality, unspecified trigger   3. Chronic rhinitis     Will start empiric treatment for sinusitis with amoxicillin.  Recommended supportive care otherwise.  Follow-up with PCP to request referral to an ENT specialist.  Counseled patient on potential for adverse effects with medications  prescribed/recommended today, ER and return-to-clinic precautions discussed, patient verbalized understanding.    Jaynee Eagles, [place] 05/29/22 [phone]

## 2022-05-30 ENCOUNTER — Telehealth: Payer: Self-pay

## 2022-05-30 NOTE — Telephone Encounter (Signed)
TC to f/u with pt after yesterday's visit to [facility].Pt reports feeling about the same as yesterday. She has started Rx's and has no questions at this time.

## 2022-06-01 LAB — DHEA: Dehydroepiandrosterone (DHEA): 418 ng/dL (ref 31–701)

## 2022-06-01 LAB — TESTOSTERONE, FREE AND TOTAL (INCLUDES SHBG)-(MALES)
% Free Testosterone: 0.7 %
Free Testosterone, S: 1.1 pg/mL
Sex Hormone Binding Globulin: 93.1 nmol/L
Testosterone, Serum (Total): 16 ng/dL

## 2022-06-01 LAB — FSH/LH
FSH: 7.7 m[IU]/mL
LH: 7 m[IU]/mL

## 2022-06-01 LAB — INSULIN-LIKE GROWTH FACTOR: Insulin-Like GF-1: 183 ng/mL (ref 84–281)

## 2022-06-01 LAB — PROGESTERONE: Progesterone: 0.2 ng/mL

## 2022-06-01 LAB — HISTAMINE DETERMINATION, BLOOD: Histamine Determination, Blood: 73 ng/mL (ref 12–127)

## 2022-06-01 LAB — INSULIN, RANDOM: INSULIN: 6.2 u[IU]/mL (ref 2.6–24.9)

## 2022-06-01 LAB — ESTRADIOL: Estradiol: 50.8 pg/mL

## 2022-06-01 LAB — DHEA-SULFATE: DHEA-SO4: 62.6 ug/dL — ABNORMAL LOW (ref 84.8–378.0)

## 2022-06-10 ENCOUNTER — Ambulatory Visit
Admission: RE | Admit: 2022-06-10 | Discharge: 2022-06-10 | Disposition: A | Discharge status: Home / Self Care | Payer: Federal, State, Local not specified - PPO | Source: Ambulatory Visit | Attending: Family Medicine | Admitting: Family Medicine

## 2022-06-10 VITALS — BP 110/77 | HR 97 | Temp 97.5°F | Resp 20 | Ht 65.0 in | Wt 117.0 lb

## 2022-06-10 DIAGNOSIS — U071 COVID-19: Secondary | ICD-10-CM

## 2022-06-10 LAB — POCT INFLUENZA A/B
Influenza A, POC: NEGATIVE
Influenza B, POC: NEGATIVE

## 2022-06-10 LAB — POC SARS CORONAVIRUS 2 AG -  ED: SARS Coronavirus 2 Ag: POSITIVE — AB

## 2022-06-10 MED ORDER — PAXLOVID (300/100) 20 X 150 MG & 10 X 100MG PO TBPK: 3.0000 | ORAL_TABLET | Freq: Two times a day (BID) | ORAL | 0 refills | Status: AC

## 2022-06-10 NOTE — Discharge Instructions (Signed)
Take the paxlovid 2 x a day for 5 days Quarantine for 5 days at home Wear mask for 10 days OTC cough and cold medication CALL for problems

## 2022-06-10 NOTE — ED Triage Notes (Signed)
Pt presents to Urgent Care with c/o cough, sore throat, otalgia, fatigue, body aches, and nasal congestion x 3 days. Has not done COVID test. Also reports bilateral ear drainage.

## 2022-06-10 NOTE — ED Provider Notes (Signed)
[facility]    CSN: [id number] Arrival date & time: 06/10/22  1158      History   Chief Complaint Chief Complaint  Patient presents with   Sore Throat   Cough   Otalgia    HPI Andrea Baker is a 32 y.o. female.   HPI  Patient is just recovering from a sinus infection.  Took antibiotics for 7 days.  Stopped 5 days ago.  For the last 2 to 3 days she has had sore throat, ear pain, cough, fatigue and body aches.  Some nasal congestion.  She states that she feels worse than her usual respiratory infection.  Very tired.  Very achy.  Past Medical History:  Diagnosis Date   Acne    Anxiety and depression 05/02/2013   Intolerant to lexapro and effexor    Constipation    GERD 10/19/2009   Failed omeprazole and protonix.    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   Psoriasis 10/24/2018   GAD (generalized anxiety disorder) 10/13/2018   MDD (major depressive disorder), recurrent episode, moderate ([place]) 10/13/2018   Dyspepsia 10/13/2018   Insomnia 06/06/2018   Panic attacks 05/02/2018   Unintentional weight loss 05/02/2018   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   Grade I hemorrhoids 06/08/2017   Seborrheic dermatitis of scalp 08/11/2016   Chronic idiopathic constipation 06/12/2015   Eczema 05/17/2013   Anxiety and depression 05/02/2013   Allergic rhinitis 08/07/2011   Gastroesophageal reflux disease 10/19/2009    Past Surgical History:  Procedure Laterality Date   ESOPHAGEAL MANOMETRY N/A 09/12/2018   Procedure: ESOPHAGEAL MANOMETRY (EM);  Surgeon: Mauri Pole, MD;  Location: [facility];  Service: Endoscopy;   Laterality: N/A;   ESOPHAGOGASTRODUODENOSCOPY     x2 have been normal and gastric emptying has been normal    ESOPHAGOGASTRODUODENOSCOPY     gastric empyting study      [place] IMPEDANCE STUDY N/A 09/12/2018   Procedure: [place] IMPEDANCE STUDY;  Surgeon: Mauri Pole, MD;  Location: [facility];  Service: Endoscopy;  Laterality: N/A;    OB History   No obstetric history on file.      Home Medications    Prior to Admission medications   Medication Sig Start Date End Date Taking? Authorizing Provider  acetaminophen (TYLENOL) 500 MG tablet Take 500 mg by mouth every 6 (six) hours as needed.   Yes [provider]  nirmatrelvir & ritonavir (PAXLOVID, 300/100,) 20 x 150 MG & 10 x 100MG  TBPK Take 3 tablets by mouth 2 (two) times daily for 5 days. 06/10/22 06/15/22 Yes Raylene Everts, MD  Alum Hydroxide-Mag Carbonate (GAVISCON PO) Take by mouth as needed.    [provider]  AMBULATORY NON FORMULARY MEDICATION FD Guard prn    [provider]  clobetasol (OLUX) 0.05 % topical foam Apply topically 2 (two) times daily. 05/08/22   Breeback, Jade L, PA-C  fexofenadine (ALLEGRA) 180 MG tablet Take 180 mg by mouth daily.    [provider]  Halobetasol Propionate (LEXETTE) 0.05 % FOAM Apply 1 Application topically daily. Apply topically. 05/06/22   Iran Planas  L, PA-C  ondansetron (ZOFRAN-ODT) 4 MG disintegrating tablet DISSOLVE ONE tablet ON THE TONGUE EVERY 8 HOURS AS NEEDED FOR NAUSEA AND VOMITING 05/06/22   Breeback, Jade L, PA-C  Probiotic Product (PROBIOTIC DAILY PO) Take by mouth.    [provider]  pseudoephedrine (SUDAFED) 60 MG tablet Take 1 tablet (60 mg total) by mouth every 8 (eight) hours as needed for congestion. 05/29/22   Jaynee Eagles, PA-C    Family History Family History  Problem Relation Age of Onset   Depression Mother    Heart attack Father    Diabetes Father        prediabetic   Hypertension Father    Hyperlipidemia Father     Diabetes Paternal Grandmother    Colon cancer Neg Hx    Esophageal cancer Neg Hx    Rectal cancer Neg Hx    Stomach cancer Neg Hx     Social History Social History   Tobacco Use   Smoking status: Never   Smokeless tobacco: Never  Vaping Use   Vaping Use: Never used  Substance Use Topics   Alcohol use: Yes    Comment: occasionally   Drug use: No     Allergies   Augmentin [amoxicillin-pot clavulanate], Doxycycline, and Pristiq [desvenlafaxine]   Review of Systems Review of Systems See HPI  Physical Exam Triage Vital Signs ED Triage Vitals  Enc Vitals Group     BP 06/10/22 1227 110/77     Pulse Rate 06/10/22 1227 97     Resp 06/10/22 1227 20     Temp 06/10/22 1227 (!) 97.5 F (36.4 C)     Temp Source 06/10/22 1227 Oral     SpO2 06/10/22 1227 98 %     Weight 06/10/22 1223 117 lb (53.1 kg)     Height 06/10/22 1223 5\' 5"  (1.651 m)     Head Circumference --      Peak Flow --      Pain Score 06/10/22 1222 5     Pain Loc --      Pain Edu? --      Excl. in [place]? --    No data found.  Updated Vital Signs BP 110/77 (BP Location: Right Arm)   Pulse 97   Temp (!) 97.5 F (36.4 C) (Oral)   Resp 20   Ht 5\' 5"  (1.651 m)   Wt 53.1 kg   LMP 05/21/2022 (Exact Date)   SpO2 98%   BMI 19.47 kg/m       Physical Exam Constitutional:      General: She is not in acute distress.    Appearance: She is well-developed. She is ill-appearing.  HENT:     Head: Normocephalic and atraumatic.     Right Ear: Tympanic membrane and ear canal normal.     Left Ear: Tympanic membrane and ear canal normal.     Nose: Congestion and rhinorrhea present.     Mouth/Throat:     Pharynx: Posterior oropharyngeal erythema present.  Eyes:     Conjunctiva/sclera: Conjunctivae normal.     Pupils: Pupils are equal, round, and reactive to light.  Cardiovascular:     Rate and Rhythm: Normal rate and regular rhythm.     Heart sounds: Normal heart sounds.  Pulmonary:     Effort: Pulmonary  effort is normal. No respiratory distress.  Abdominal:     General: There is no distension.     Palpations: Abdomen is soft.  Musculoskeletal:  General: Normal range of motion.     Cervical back: Normal range of motion.  Lymphadenopathy:     Cervical: No cervical adenopathy.  Skin:    General: Skin is warm and dry.  Neurological:     Mental Status: She is alert.  Psychiatric:        Mood and Affect: Mood normal.        Behavior: Behavior normal.      UC Treatments / Results  Labs (all labs ordered are listed, but only abnormal results are displayed) Labs Reviewed  POC SARS CORONAVIRUS 2 AG -  ED - Abnormal; Notable for the following components:      Result Value   SARS Coronavirus 2 Ag Positive (*)    All other components within normal limits  POCT INFLUENZA A/B    EKG   Radiology No results found.  Procedures Procedures (including critical care time)  Medications Ordered in UC Medications - No data to display  Initial Impression / Assessment and Plan / UC Course  I have reviewed the triage vital signs and the nursing notes.  Pertinent labs & imaging results that were available during my care of the patient were reviewed by me and considered in my medical decision making (see chart for details).    [place] quarantine, mask wearing, treatment at home for COVID.  Prescription given for Paxlovid with instructions Final Clinical Impressions(s) / UC Diagnoses   Final diagnoses:  [id number]     Discharge Instructions      Take the paxlovid 2 x a day for 5 days Quarantine for 5 days at home Wear mask for 10 days OTC cough and cold medication CALL for problems   ED Prescriptions     Medication Sig Dispense Auth. Provider   nirmatrelvir & ritonavir (PAXLOVID, 300/100,) 20 x 150 MG & 10 x 100MG  TBPK Take 3 tablets by mouth 2 (two) times daily for 5 days. 30 tablet Raylene Everts, MD      PDMP not reviewed this encounter.   Raylene Everts,  MD 06/10/22 1318

## 2022-06-11 DIAGNOSIS — R109 Unspecified abdominal pain: Secondary | ICD-10-CM | POA: Diagnosis not present

## 2022-06-11 DIAGNOSIS — R197 Diarrhea, unspecified: Secondary | ICD-10-CM | POA: Diagnosis not present

## 2022-06-11 DIAGNOSIS — Z883 Allergy status to other anti-infective agents status: Secondary | ICD-10-CM | POA: Diagnosis not present

## 2022-06-11 DIAGNOSIS — Z79899 Other long term (current) drug therapy: Secondary | ICD-10-CM | POA: Diagnosis not present

## 2022-06-11 DIAGNOSIS — K219 Gastro-esophageal reflux disease without esophagitis: Secondary | ICD-10-CM | POA: Diagnosis not present

## 2022-06-11 DIAGNOSIS — R112 Nausea with vomiting, unspecified: Secondary | ICD-10-CM | POA: Diagnosis not present

## 2022-06-11 DIAGNOSIS — Z880 Allergy status to penicillin: Secondary | ICD-10-CM | POA: Diagnosis not present

## 2022-06-12 ENCOUNTER — Telehealth: Payer: Self-pay | Admitting: General Practice

## 2022-06-12 NOTE — Transitions of Care (Post Inpatient/ED Visit) (Signed)
   06/12/2022  Name: Andrea Baker MRN: [id number] DOB: 1990/04/30  Today's TOC FU Call Status: Today's TOC FU Call Status:: Unsuccessul Call (1st Attempt) Unsuccessful Call (1st Attempt) Date: 06/12/22  Attempted to reach the patient regarding the most recent Inpatient/ED visit.  Follow Up Plan: Additional outreach attempts will be made to reach the patient to complete the Transitions of Care (Post Inpatient/ED visit) call.   Signature Tinnie Gens, RN BSN

## 2022-06-15 NOTE — Transitions of Care (Post Inpatient/ED Visit) (Signed)
   06/15/2022  Name: Andrea Baker MRN: [id number] DOB: 14-Oct-1990  Today's TOC FU Call Status: Today's TOC FU Call Status:: Unsuccessful Call (2nd Attempt) Unsuccessful Call (1st Attempt) Date: 06/12/22 Unsuccessful Call (2nd Attempt) Date: 06/15/22  Attempted to reach the patient regarding the most recent Inpatient/ED visit.  Follow Up Plan: Additional outreach attempts will be made to reach the patient to complete the Transitions of Care (Post Inpatient/ED visit) call.   Signature Tinnie Gens, [employer]

## 2022-06-18 NOTE — Transitions of Care (Post Inpatient/ED Visit) (Signed)
   06/18/2022  Name: Andrea Baker MRN: [id number] DOB: 1990/08/07  Today's TOC FU Call Status: Today's TOC FU Call Status:: Unsuccessful Call (3rd Attempt) Unsuccessful Call (1st Attempt) Date: 06/12/22 Unsuccessful Call (2nd Attempt) Date: 06/15/22 Unsuccessful Call (3rd Attempt) Date: 06/18/22  Attempted to reach the patient regarding the most recent Inpatient/ED visit.  Follow Up Plan: No further outreach attempts will be made at this time. We have been unable to contact the patient.  Signature Tinnie Gens, RN BSN

## 2022-07-06 DIAGNOSIS — H6993 Unspecified Eustachian tube disorder, bilateral: Secondary | ICD-10-CM | POA: Diagnosis not present

## 2022-07-06 DIAGNOSIS — H93293 Other abnormal auditory perceptions, bilateral: Secondary | ICD-10-CM | POA: Diagnosis not present

## 2022-07-06 DIAGNOSIS — J0191 Acute recurrent sinusitis, unspecified: Secondary | ICD-10-CM | POA: Diagnosis not present

## 2022-07-06 DIAGNOSIS — H608X2 Other otitis externa, left ear: Secondary | ICD-10-CM | POA: Diagnosis not present

## 2022-07-14 ENCOUNTER — Ambulatory Visit: Payer: Federal, State, Local not specified - PPO | Admitting: Physician Assistant

## 2022-07-14 VITALS — BP 123/74 | HR 72 | Wt 117.0 lb

## 2022-07-14 DIAGNOSIS — B070 Plantar wart: Secondary | ICD-10-CM | POA: Diagnosis not present

## 2022-07-14 NOTE — Progress Notes (Signed)
   Acute Office Visit  Subjective:     Patient ID: Andrea Baker, female    DOB: December 01, 1990, 32 y.o.   MRN: [id number]  No chief complaint on file.   HPI Patient is in today for follow up plantar wart. Was previously frozen with no resolution. Continues to hurt when walking. Present for years but painful for the last 6 months. Tried salacylic acid and previous cryotherapy.   ROS See HPI.     Objective:    There were no vitals taken for this visit. BP Readings from Last 3 Encounters:  07/14/22 123/74  06/10/22 110/77  05/29/22 108/76   Wt Readings from Last 3 Encounters:  07/14/22 117 lb (53.1 kg)  06/10/22 117 lb (53.1 kg)  05/29/22 117 lb (53.1 kg)    Cryotherapy Procedure Note  Pre-operative Diagnosis: plantar wart  Post-operative Diagnosis: same  Locations: right plantar foot under pinky toe  Indications: pain  Procedure Details  History of allergy to iodine: no. Pacemaker? no.  Patient informed of risks (permanent scarring, infection, light or dark discoloration, bleeding, infection, weakness, numbness and recurrence of the lesion) and benefits of the procedure and verbal informed consent obtained.  The areas are treated with liquid nitrogen therapy, frozen until ice ball extended 2 mm beyond lesion, allowed to thaw, and treated again. The patient tolerated procedure well.  The patient was instructed on post-op care, warned that there may be blister formation, redness and pain. Recommend OTC analgesia as needed for pain.  Condition: Stable  Complications: none.  Plan: 1. Instructed to keep the area dry and covered for 24-48h and clean thereafter. 2. Warning signs of infection were reviewed.   3. Recommended that the patient use OTC acetaminophen as needed for pain.     Physical Exam Wart on plantar foot just under pinky toe 1cm by 1cm. Tender to palpation.       Assessment & Plan:  Marland KitchenMarland KitchenVerdia was seen today for follow-up.  Diagnoses and all orders  for this visit:  Plantar wart of right foot -     Ambulatory referral to Podiatry  Cryotherapy completed again.  Referral to podiatry if not decreasing in size Tandy Gaw, PA-C

## 2022-07-14 NOTE — Patient Instructions (Signed)
  Plantar Warts Plantar warts are small growths on the bottom of the foot (sole). Warts are caused by a type of germ (virus). Most warts are not painful, and they usually do not cause problems. Sometimes, plantar warts can cause pain when you walk. Warts often go away on their own in time. They can also spread to other areas of the body. Treatments may be done if needed. What are the causes? Plantar warts are caused by a germ that is called human papillomavirus (HPV). Walking barefoot can cause exposure to the germ, especially if your feet are wet. Warts happen when HPV attacks a break in the skin of the foot. What increases the risk? Being between 10 and 20 years of age. Using public showers or locker rooms. Having a weakened body defense system (immune system). What are the signs or symptoms?  Flat or slightly raised growths that have a rough surface and look like a callus. Pain when you use your foot to support your body weight. How is this treated? In many cases, warts do not need treatment. Without treatment, they often go away with time. If treatment is needed or wanted, options may include: Applying medicated solutions, creams, or patches to the wart. These make the skin soft so that layers will slowly shed away. Freezing the wart with liquid nitrogen (cryotherapy). Burning the wart with: Laser treatment. An electrified probe (electrocautery). Injecting a medicine (Candida antigen) into the wart to help the body's defense system fight off the wart. Having surgery to remove the wart. Putting duct tape over the top of the wart (occlusion). You will leave the tape in place for as long as told by your doctor. Then you will replace it with a new strip of tape. This is done until the wart goes away. Repeat treatment may be needed if you choose to remove warts. Warts sometimes go away and come back again. Follow these instructions at home: General instructions Apply creams or  solutions only as told by your doctor. Follow these steps if your doctor tells you to do so: Soak your foot in warm water. Remove the top layer of softened skin before you apply the medicine. You can use a pumice stone to remove the skin. After you apply the medicine, put a bandage over the area of the wart. Repeat the process every day or as told by your doctor. Do not scratch or pick at a wart. Wash your hands after you touch a wart. If a wart hurts, try covering it with a bandage that has a hole in the middle. Keep all follow-up visits as told by your doctor. This is important. How is this prevented?  Wear shoes and socks. Change your socks every day. Keep your feet clean and dry. Check your feet often. Do not walk barefoot in: Shared locker rooms. Shower areas. Swimming pools. Avoid direct contact with warts on other people. Contact a doctor if: Your warts do not improve after treatment. You have redness, swelling, or pain at the site of a wart. You have bleeding from a wart, and the bleeding does not stop when you put light pressure on the wart. You have diabetes and you get a wart. Summary Warts are small growths on the skin. When warts happen on the bottom of the foot (sole), they are called plantar warts. In many cases, warts do not need treatment. Apply creams or solutions only as told by your doctor. Do not scratch or pick at a wart. Wash   your hands after you touch a wart. This information is not intended to replace advice given to you by your health care provider. Make sure you discuss any questions you have with your health care provider. Document Revised: 06/27/2020 Document Reviewed: 06/27/2020 Elsevier Patient Education  2023 [employer]
